# Patient Record
Sex: Female | Born: 1950 | Race: White | Hispanic: No | Smoking: Former smoker
Health system: Southern US, Community
[De-identification: ages and names within clinical notes are randomized; demographics above are authoritative.]

## PROBLEM LIST (undated history)

## (undated) DIAGNOSIS — I509 Heart failure, unspecified: Secondary | ICD-10-CM

## (undated) DIAGNOSIS — J449 Chronic obstructive pulmonary disease, unspecified: Secondary | ICD-10-CM

## (undated) DIAGNOSIS — M5126 Other intervertebral disc displacement, lumbar region: Secondary | ICD-10-CM

## (undated) DIAGNOSIS — Z720 Tobacco use: Secondary | ICD-10-CM

## (undated) DIAGNOSIS — F419 Anxiety disorder, unspecified: Secondary | ICD-10-CM

---

## 1998-06-29 ENCOUNTER — Ambulatory Visit (HOSPITAL_COMMUNITY): Admission: RE | Admit: 1998-06-29 | Discharge: 1998-06-29 | Payer: Self-pay | Admitting: Sports Medicine

## 1998-06-29 ENCOUNTER — Encounter: Payer: Self-pay | Admitting: Sports Medicine

## 1998-07-13 ENCOUNTER — Encounter: Payer: Self-pay | Admitting: Sports Medicine

## 1998-07-13 ENCOUNTER — Ambulatory Visit (HOSPITAL_COMMUNITY): Admission: RE | Admit: 1998-07-13 | Discharge: 1998-07-13 | Payer: Self-pay | Admitting: Sports Medicine

## 1998-07-27 ENCOUNTER — Encounter: Payer: Self-pay | Admitting: Sports Medicine

## 1998-07-27 ENCOUNTER — Ambulatory Visit (HOSPITAL_COMMUNITY): Admission: RE | Admit: 1998-07-27 | Discharge: 1998-07-27 | Payer: Self-pay | Admitting: Sports Medicine

## 1999-03-12 ENCOUNTER — Other Ambulatory Visit: Admission: RE | Admit: 1999-03-12 | Discharge: 1999-03-12 | Payer: Self-pay | Admitting: Obstetrics and Gynecology

## 2002-08-09 ENCOUNTER — Encounter: Payer: Self-pay | Admitting: Emergency Medicine

## 2002-08-09 ENCOUNTER — Emergency Department (HOSPITAL_COMMUNITY): Admission: EM | Admit: 2002-08-09 | Discharge: 2002-08-09 | Payer: Self-pay | Admitting: Emergency Medicine

## 2017-04-05 ENCOUNTER — Other Ambulatory Visit: Payer: Self-pay | Admitting: Family Medicine

## 2017-04-05 ENCOUNTER — Ambulatory Visit
Admission: RE | Admit: 2017-04-05 | Discharge: 2017-04-05 | Disposition: A | Discharge status: Home / Self Care | Payer: Medicare Other | Source: Ambulatory Visit | Attending: Family Medicine | Admitting: Family Medicine

## 2017-04-05 DIAGNOSIS — M545 Low back pain: Secondary | ICD-10-CM

## 2018-02-01 ENCOUNTER — Emergency Department (HOSPITAL_COMMUNITY): Payer: Medicare Other

## 2018-02-01 ENCOUNTER — Encounter (HOSPITAL_COMMUNITY): Payer: Self-pay | Admitting: Emergency Medicine

## 2018-02-01 ENCOUNTER — Other Ambulatory Visit: Payer: Self-pay

## 2018-02-01 ENCOUNTER — Inpatient Hospital Stay (HOSPITAL_COMMUNITY)
Admission: EM | Admit: 2018-02-01 | Discharge: 2018-02-06 | DRG: 286 | Disposition: A | Discharge status: Home / Self Care | Payer: Medicare Other | Attending: Internal Medicine | Admitting: Internal Medicine

## 2018-02-01 DIAGNOSIS — J9601 Acute respiratory failure with hypoxia: Secondary | ICD-10-CM | POA: Diagnosis present

## 2018-02-01 DIAGNOSIS — I5021 Acute systolic (congestive) heart failure: Principal | ICD-10-CM | POA: Diagnosis present

## 2018-02-01 DIAGNOSIS — Z880 Allergy status to penicillin: Secondary | ICD-10-CM

## 2018-02-01 DIAGNOSIS — J441 Chronic obstructive pulmonary disease with (acute) exacerbation: Secondary | ICD-10-CM

## 2018-02-01 DIAGNOSIS — E43 Unspecified severe protein-calorie malnutrition: Secondary | ICD-10-CM | POA: Diagnosis present

## 2018-02-01 DIAGNOSIS — J449 Chronic obstructive pulmonary disease, unspecified: Secondary | ICD-10-CM

## 2018-02-01 DIAGNOSIS — I214 Non-ST elevation (NSTEMI) myocardial infarction: Secondary | ICD-10-CM

## 2018-02-01 DIAGNOSIS — Z681 Body mass index (BMI) 19 or less, adult: Secondary | ICD-10-CM

## 2018-02-01 DIAGNOSIS — Z87891 Personal history of nicotine dependence: Secondary | ICD-10-CM

## 2018-02-01 DIAGNOSIS — I2510 Atherosclerotic heart disease of native coronary artery without angina pectoris: Secondary | ICD-10-CM | POA: Diagnosis present

## 2018-02-01 DIAGNOSIS — R0602 Shortness of breath: Secondary | ICD-10-CM | POA: Diagnosis not present

## 2018-02-01 DIAGNOSIS — E785 Hyperlipidemia, unspecified: Secondary | ICD-10-CM

## 2018-02-01 DIAGNOSIS — F17201 Nicotine dependence, unspecified, in remission: Secondary | ICD-10-CM

## 2018-02-01 DIAGNOSIS — F410 Panic disorder [episodic paroxysmal anxiety] without agoraphobia: Secondary | ICD-10-CM | POA: Diagnosis present

## 2018-02-01 DIAGNOSIS — R636 Underweight: Secondary | ICD-10-CM

## 2018-02-01 DIAGNOSIS — F419 Anxiety disorder, unspecified: Secondary | ICD-10-CM | POA: Diagnosis present

## 2018-02-01 DIAGNOSIS — I071 Rheumatic tricuspid insufficiency: Secondary | ICD-10-CM | POA: Diagnosis present

## 2018-02-01 DIAGNOSIS — R7989 Other specified abnormal findings of blood chemistry: Secondary | ICD-10-CM

## 2018-02-01 DIAGNOSIS — F411 Generalized anxiety disorder: Secondary | ICD-10-CM | POA: Diagnosis present

## 2018-02-01 DIAGNOSIS — I429 Cardiomyopathy, unspecified: Secondary | ICD-10-CM

## 2018-02-01 DIAGNOSIS — R000 Tachycardia, unspecified: Secondary | ICD-10-CM | POA: Diagnosis present

## 2018-02-01 DIAGNOSIS — K5900 Constipation, unspecified: Secondary | ICD-10-CM | POA: Diagnosis present

## 2018-02-01 DIAGNOSIS — R748 Abnormal levels of other serum enzymes: Secondary | ICD-10-CM | POA: Diagnosis not present

## 2018-02-01 DIAGNOSIS — R778 Other specified abnormalities of plasma proteins: Secondary | ICD-10-CM

## 2018-02-01 HISTORY — DX: Other intervertebral disc displacement, lumbar region: M51.26

## 2018-02-01 HISTORY — DX: Anxiety disorder, unspecified: F41.9

## 2018-02-01 HISTORY — DX: Chronic obstructive pulmonary disease, unspecified: J44.9

## 2018-02-01 HISTORY — DX: Tobacco use: Z72.0

## 2018-02-01 LAB — BASIC METABOLIC PANEL
Anion gap: 13 (ref 5–15)
BUN: 14 mg/dL (ref 8–23)
CO2: 23 mmol/L (ref 22–32)
CREATININE: 0.94 mg/dL (ref 0.44–1.00)
Calcium: 9.4 mg/dL (ref 8.9–10.3)
Chloride: 104 mmol/L (ref 98–111)
GFR calc Af Amer: >60 mL/min (ref >60)
GFR calc non Af Amer: >60 mL/min (ref >60)
GLUCOSE: 124 mg/dL — AB (ref 70–99)
Potassium: 4.5 mmol/L (ref 3.5–5.1)
Sodium: 140 mmol/L (ref 135–145)

## 2018-02-01 LAB — TSH: TSH: 0.87 u[IU]/mL (ref 0.350–4.500)

## 2018-02-01 LAB — CBC
HCT: 46.5 % — ABNORMAL HIGH (ref 36.0–46.0)
Hemoglobin: 15.2 g/dL — ABNORMAL HIGH (ref 12.0–15.0)
MCH: 31.1 pg (ref 26.0–34.0)
MCHC: 32.7 g/dL (ref 30.0–36.0)
MCV: 95.3 fL (ref 78.0–100.0)
PLATELETS: 243 10*3/uL (ref 150–400)
RBC: 4.88 MIL/uL (ref 3.87–5.11)
RDW: 13.2 % (ref 11.5–15.5)
WBC: 9.1 10*3/uL (ref 4.0–10.5)

## 2018-02-01 LAB — I-STAT TROPONIN, ED: Troponin i, poc: 1.55 ng/mL (ref 0.00–0.08)

## 2018-02-01 LAB — TROPONIN I: Troponin I: 1.05 ng/mL (ref <0.03)

## 2018-02-01 LAB — D-DIMER, QUANTITATIVE: D-Dimer, Quant: <0.27 ug/mL-FEU (ref 0.00–0.50)

## 2018-02-01 MED ORDER — ALBUTEROL SULFATE (2.5 MG/3ML) 0.083% IN NEBU: 2.5000 mg | INHALATION_SOLUTION | RESPIRATORY_TRACT | Status: DC | PRN

## 2018-02-01 MED ORDER — ASPIRIN 81 MG PO CHEW
324.0000 mg | CHEWABLE_TABLET | Freq: Once | ORAL | Status: AC
  Administered 2018-02-01: 324 mg via ORAL
  Filled 2018-02-01: qty 4

## 2018-02-01 MED ORDER — ENOXAPARIN SODIUM 40 MG/0.4ML ~~LOC~~ SOLN
40.0000 mg | Freq: Every day | SUBCUTANEOUS | Status: DC
  Filled 2018-02-01 (×4): qty 0.4

## 2018-02-01 MED ORDER — IPRATROPIUM-ALBUTEROL 0.5-2.5 (3) MG/3ML IN SOLN
3.0000 mL | Freq: Three times a day (TID) | RESPIRATORY_TRACT | Status: DC
  Administered 2018-02-02 – 2018-02-05 (×10): 3 mL via RESPIRATORY_TRACT
  Filled 2018-02-01 (×11): qty 3

## 2018-02-01 MED ORDER — ACETAMINOPHEN 650 MG RE SUPP: 650.0000 mg | Freq: Four times a day (QID) | RECTAL | Status: DC | PRN

## 2018-02-01 MED ORDER — PREDNISONE 50 MG PO TABS
60.0000 mg | ORAL_TABLET | Freq: Every day | ORAL | Status: AC
  Administered 2018-02-02 – 2018-02-03 (×2): 60 mg via ORAL
  Filled 2018-02-01 (×2): qty 1

## 2018-02-01 MED ORDER — ACETAMINOPHEN 325 MG PO TABS: 650.0000 mg | ORAL_TABLET | Freq: Four times a day (QID) | ORAL | Status: DC | PRN

## 2018-02-01 MED ORDER — IPRATROPIUM-ALBUTEROL 0.5-2.5 (3) MG/3ML IN SOLN
3.0000 mL | Freq: Four times a day (QID) | RESPIRATORY_TRACT | Status: DC
  Administered 2018-02-01: 3 mL via RESPIRATORY_TRACT
  Filled 2018-02-01: qty 3

## 2018-02-01 MED ORDER — HYDROCODONE-ACETAMINOPHEN 5-325 MG PO TABS
1.0000 | ORAL_TABLET | Freq: Once | ORAL | Status: AC
  Administered 2018-02-01: 1 via ORAL
  Filled 2018-02-01: qty 1

## 2018-02-01 NOTE — ED Triage Notes (Signed)
Pt arrived [facility] from home with c/o SOB x a few days gradually worsening, pt reports that starting last night she was having substantial increase in difficulty specifically with movement. Pt given 2.5 albuterol, 0.5 Atrovent, and 125mg  IV solumedrol PTA with EMS. Diagnosed with COPD "a couple months ago," Currently on 3L n/c with EMS  (not on home O2) Vitals with EMS BP 118/90 P114 O298% RR24. Pt denies any pain at this time. 18LAC

## 2018-02-01 NOTE — ED Notes (Signed)
ED Provider at bedside. 

## 2018-02-01 NOTE — ED Provider Notes (Signed)
[facility] Provider Note   CSN: [id number] Arrival date & time: 02/01/18  1524     History   Chief Complaint Chief Complaint  Patient presents with  . Shortness of Breath    HPI Vickie Bowman is a 67 y.o. female.  HPI Patient presents with shortness of breath.  Began a few days ago but states she is been feeling bad for couple weeks now.  Diagnosed by PCP with COPD couple months ago.  States that she has an inhaler but that is not working as well the last few days.  No chest pain.  Had a cough without real sputum production.  Sats in the low 90s for EMS.  She quit smoking around 2 weeks ago.  States she was smoked about a pack a day.  States her PCP diagnosed her with COPD but listening to her.  No swelling in her legs.  No abdominal pain.  No fevers.  Given breathing treatments and steroids by EMS.  Started on oxygen.  She is not on oxygen at baseline. Past Medical History:  Diagnosis Date  . COPD (chronic obstructive pulmonary disease) (HCC)     There are no active problems to display for this patient.   History reviewed. No pertinent surgical history.   OB History   None      Home Medications    Prior to Admission medications   Not on File    Family History No family history on file.  Social History Social History   Tobacco Use  . Smoking status: Former Smoker    Last attempt to quit: 01/07/2018    Years since quitting: 0.0  . Smokeless tobacco: Never Used  Substance Use Topics  . Alcohol use: Not Currently  . Drug use: Not Currently     Allergies   Penicillins   Review of Systems Review of Systems  Constitutional: Negative for appetite change.  HENT: Negative for congestion.   Respiratory: Positive for cough and shortness of breath. Negative for wheezing.   Cardiovascular: Negative for chest pain.  Gastrointestinal: Negative for abdominal pain.  Genitourinary: Negative for flank pain.    Musculoskeletal: Negative for back pain.  Neurological: Negative for seizures.  Hematological: Negative for adenopathy.  Psychiatric/Behavioral: Negative for confusion.     Physical Exam Updated Vital Signs BP 109/88   Pulse 100   Temp 98.2 F (36.8 C) (Temporal)   Resp (!) 24   Ht 5\' 8"  (1.727 m)   Wt 49.9 kg   SpO2 100%   BMI 16.73 kg/m   Physical Exam  Constitutional: She appears well-developed.  HENT:  Head: Atraumatic.  Eyes: Pupils are equal, round, and reactive to light.  Neck: Neck supple.  Cardiovascular:  Regular tachycardia  Pulmonary/Chest:  Mildly harsh breath sounds with somewhat decreased air movement without respiratory distress.  No focal rales or rhonchi.  Abdominal: Soft.  Musculoskeletal:       Right lower leg: She exhibits no edema.       Left lower leg: She exhibits no edema.  Neurological: She is alert.  Skin: Skin is warm. Capillary refill takes less than 2 seconds.     ED Treatments / Results  Labs (all labs ordered are listed, but only abnormal results are displayed) Labs Reviewed  BASIC METABOLIC PANEL - Abnormal; Notable for the following components:      Result Value   Glucose, Bld 124 (*)    All other components within normal limits  CBC - Abnormal; Notable for the following components:   Hemoglobin 15.2 (*)    HCT 46.5 (*)    All other components within normal limits  I-STAT TROPONIN, ED - Abnormal; Notable for the following components:   Troponin i, poc 1.55 (*)    All other components within normal limits  D-DIMER, QUANTITATIVE (NOT AT [facility])    EKG EKG Interpretation  Date/Time:  Thursday February 01 2018 15:31:00 EDT Ventricular Rate:  113 PR Interval:    QRS Duration: 91 QT Interval:  307 QTC Calculation: 421 R Axis:   88 Text Interpretation:  Sinus tachycardia Biatrial enlargement Anteroseptal infarct, age indeterminate Lead(s) I were not used for morphology analysis Confirmed by Benjiman Core [id number]) on  02/01/2018 3:41:34 PM Also confirmed by Benjiman Core [phone]), editor Sheppard Evens ([id number])  on 02/01/2018 4:30:38 PM   Radiology Dg Chest 2 View  Result Date: 02/01/2018 CLINICAL DATA:  Short of breath for several days, worse recently, post breathing treatment EXAM: CHEST - 2 VIEW COMPARISON:  None. FINDINGS: The lungs are markedly hyperaerated with increased AP diameter and flattened hemidiaphragms consistent with emphysema. No parenchymal infiltrate or pleural effusion is seen and no lung nodule is evident. Mediastinal and hilar contours are unremarkable. The heart is within normal limits in size. No bony abnormality is seen. IMPRESSION: Hyperaeration most consistent with emphysema. No active lung disease. Electronically Signed   By: Dwyane Dee M.D.   On: 02/01/2018 17:12    Procedures Procedures (including critical care time)  Medications Ordered in ED Medications - No data to display   Initial Impression / Assessment and Plan / ED Course  I have reviewed the triage vital signs and the nursing notes.  Pertinent labs & imaging results that were available during my care of the patient were reviewed by me and considered in my medical decision making (see chart for details).     Patient with shortness of breath.  Worse recently.  Has been diagnosed with COPD.  Has had no chest pain.  Feels better on oxygen but sats did not go hypoxic.  Negative d-dimer.  EKG overall has nonspecific changes but troponin is elevated.  Will admit to hospital for further evaluation and treatment.  Final Clinical Impressions(s) / ED Diagnoses   Final diagnoses:  COPD exacerbation (HCC)  Elevated troponin    ED Discharge Orders    None       Benjiman Core, MD 02/01/18 1718

## 2018-02-01 NOTE — ED Notes (Signed)
Patient transported to X-ray 

## 2018-02-01 NOTE — H&P (Signed)
History and Physical    Vickie GillesSylvia J Khim BJY:[id number] DOB: 06-22-1950 DOA: 02/01/2018  PCP: Aida PufferLittle, James, MD  Patient coming from:   I have personally briefly [facility]  Chief Complaint: sob since many months, worsened last night.   HPI: Vickie Bowman is a 67 y.o. female with medical history significant of recently diagnosed with COPD by PCP, comes in for worsening sob since 3 months. Last night, pt reports having an acute dyspnea at rest, worsens with ambulation, and relieved some on using her inhaler. But she continues to have persistent symptoms of sob, she called EMS. EMS apparently found  Her to be diffusely wheezing, put her on oxygen and gave her a neb treatment. She was brought ot ED for further evaluation. On arrival her wheezing has improved and dyspnea improved. She denies any chest pain, tightness, cough, syncope, pedal edema, orthopnea, PND. She reports she had sweating yesterday and this morning associated with sob.  She denies fever or chills. She denies nausea, vomiting , abdominal pain, diarrhea, dysuria, . She denies any tingling or numbness or any weakness of her legs or arms.   ED Course: workk up in ED, showed hemoglobin or 15.2 normal creatinine, d dimer is 0.27 troponin of 1.55. CXR shows emphysema. EKG shows sinus tachycardia with bi atrial enlargement.  She was referred to medical service for admission for elevated troponin and sob.   Review of Systems: As per HPI otherwise 10 point review of systems negative.   Past Medical History:  Diagnosis Date  . COPD (chronic obstructive pulmonary disease) (HCC)     History reviewed. No pertinent surgical history.   reports that she quit smoking about 3 weeks ago. She has never used smokeless tobacco. She reports that she drank alcohol. She reports that she has current or past drug history.  Allergies  Allergen Reactions  . Penicillins Swelling    Family history of  breast cancer in the sister,  H/o cancer in both parents.   Prior to Admission medications   Not on File    Physical Exam: Vitals:   02/01/18 1600 02/01/18 1615 02/01/18 1630 02/01/18 1730  BP: (!) 116/91 115/90 109/88 102/66  Pulse: (!) 101 100 100   Resp: (!) 25 19 (!) 24 19  Temp:      TempSrc:      SpO2: 100% 96% 100%   Weight:      Height:        Constitutional: NAD, calm, comfortable Vitals:   02/01/18 1600 02/01/18 1615 02/01/18 1630 02/01/18 1730  BP: (!) 116/91 115/90 109/88 102/66  Pulse: (!) 101 100 100   Resp: (!) 25 19 (!) 24 19  Temp:      TempSrc:      SpO2: 100% 96% 100%   Weight:      Height:       Eyes: PERRL, lids and conjunctivae normal ENMT: Mucous membranes are dry  Neck: normal, supple, no masses, no thyromegaly Respiratory: diminished air entry bilateral, scattered wheezing , no rhonchi.  Cardiovascular: Regular rate and rhythm, no murmurs / rubs / gallops. No extremity edema. 2+ pedal pulses. Abdomen: no tenderness, no masses palpated. No hepatosplenomegaly. Bowel sounds positive.  Musculoskeletal: no clubbing / cyanosis. No joint deformity upper and lower extremities. Good ROM, no contractures. Neurologic: CN 2-12 grossly intact. Sensation intact, DTR normal. Strength 5/5 in all 4.  Psychiatric: Normal judgment and insight. Alert and oriented x 3.anxious  Labs on Admission: I have personally reviewed following labs and imaging studies  CBC: Recent Labs  Lab 02/01/18 1538  WBC 9.1  HGB 15.2*  HCT 46.5*  MCV 95.3  PLT 243   Basic Metabolic Panel: Recent Labs  Lab 02/01/18 1538  NA 140  K 4.5  CL 104  CO2 23  GLUCOSE 124*  BUN 14  CREATININE 0.94  CALCIUM 9.4   GFR: Estimated Creatinine Clearance: 44.5 mL/min (by C-G formula based on SCr of 0.94 mg/dL). Liver Function Tests: No results for input(s): AST, ALT, ALKPHOS, BILITOT, PROT, ALBUMIN in the last 168 hours. No results for input(s): LIPASE, AMYLASE in the last 168  hours. No results for input(s): AMMONIA in the last 168 hours. Coagulation Profile: No results for input(s): INR, PROTIME in the last 168 hours. Cardiac Enzymes: No results for input(s): CKTOTAL, CKMB, CKMBINDEX, TROPONINI in the last 168 hours. BNP (last 3 results) No results for input(s): PROBNP in the last 8760 hours. HbA1C: No results for input(s): HGBA1C in the last 72 hours. CBG: No results for input(s): GLUCAP in the last 168 hours. Lipid Profile: No results for input(s): CHOL, HDL, LDLCALC, TRIG, CHOLHDL, LDLDIRECT in the last 72 hours. Thyroid Function Tests: No results for input(s): TSH, T4TOTAL, FREET4, T3FREE, THYROIDAB in the last 72 hours. Anemia Panel: No results for input(s): VITAMINB12, FOLATE, FERRITIN, TIBC, IRON, RETICCTPCT in the last 72 hours. Urine analysis: No results found for: COLORURINE, APPEARANCEUR, LABSPEC, PHURINE, GLUCOSEU, HGBUR, BILIRUBINUR, KETONESUR, PROTEINUR, UROBILINOGEN, NITRITE, LEUKOCYTESUR  Radiological Exams on Admission: Dg Chest 2 View  Result Date: 02/01/2018 CLINICAL DATA:  Short of breath for several days, worse recently, post breathing treatment EXAM: CHEST - 2 VIEW COMPARISON:  None. FINDINGS: The lungs are markedly hyperaerated with increased AP diameter and flattened hemidiaphragms consistent with emphysema. No parenchymal infiltrate or pleural effusion is seen and no lung nodule is evident. Mediastinal and hilar contours are unremarkable. The heart is within normal limits in size. No bony abnormality is seen. IMPRESSION: Hyperaeration most consistent with emphysema. No active lung disease. Electronically Signed   By: Dwyane Dee M.D.   On: 02/01/2018 17:12    EKG: Independently reviewed. Sinus tachycardia, bi atrial enlargement  Assessment/Plan Active Problems:   SOB (shortness of breath)    Dyspnea at rest and worse on exertion: Admit for acute copd exacerbation. CXR shows emphysema.  Start her oral prednisone 60 mg daily and  duonebs.   oxygen to keep sats greater than 90%. She will need pulmonology referral on discharge for PFT'S.   Elevated troponin; Pt denies chest pain, chest tightness. D dimer is negative.  Ekg does not show any ischemic changes.  Admit for observation for serial troponins, EKG in am.  Echocardiogram ordered.   Tobacco abuse: Pt reports she quit 3 weeks ago. Counseled .   DVT prophylaxis: lovenox.  Code Status: full code.  Family Communication: friend at bedside.  Disposition Plan: pending clinical improvement.  Consults called: none.  Admission status: tele/obs   Kathlen Mody MD [facility] Pager [phone]  If 7PM-7AM, please contact night-coverage [email or web address] Password [facility]  02/01/2018, 6:23 PM

## 2018-02-02 ENCOUNTER — Ambulatory Visit (HOSPITAL_BASED_OUTPATIENT_CLINIC_OR_DEPARTMENT_OTHER): Payer: Medicare Other

## 2018-02-02 ENCOUNTER — Encounter (HOSPITAL_COMMUNITY): Payer: Self-pay | Admitting: Physician Assistant

## 2018-02-02 DIAGNOSIS — J441 Chronic obstructive pulmonary disease with (acute) exacerbation: Secondary | ICD-10-CM

## 2018-02-02 DIAGNOSIS — I214 Non-ST elevation (NSTEMI) myocardial infarction: Secondary | ICD-10-CM

## 2018-02-02 DIAGNOSIS — Z720 Tobacco use: Secondary | ICD-10-CM

## 2018-02-02 DIAGNOSIS — R0602 Shortness of breath: Secondary | ICD-10-CM

## 2018-02-02 DIAGNOSIS — I5043 Acute on chronic combined systolic (congestive) and diastolic (congestive) heart failure: Secondary | ICD-10-CM

## 2018-02-02 DIAGNOSIS — E43 Unspecified severe protein-calorie malnutrition: Secondary | ICD-10-CM | POA: Diagnosis present

## 2018-02-02 LAB — BASIC METABOLIC PANEL
ANION GAP: 9 (ref 5–15)
BUN: 19 mg/dL (ref 8–23)
CALCIUM: 9.4 mg/dL (ref 8.9–10.3)
CHLORIDE: 108 mmol/L (ref 98–111)
CO2: 25 mmol/L (ref 22–32)
Creatinine, Ser: 0.85 mg/dL (ref 0.44–1.00)
GFR calc non Af Amer: >60 mL/min (ref >60)
Glucose, Bld: 120 mg/dL — ABNORMAL HIGH (ref 70–99)
POTASSIUM: 4.6 mmol/L (ref 3.5–5.1)
Sodium: 142 mmol/L (ref 135–145)

## 2018-02-02 LAB — TROPONIN I
TROPONIN I: 0.97 ng/mL — AB (ref <0.03)
Troponin I: 0.81 ng/mL (ref <0.03)

## 2018-02-02 LAB — CBC
HEMATOCRIT: 44.4 % (ref 36.0–46.0)
HEMOGLOBIN: 14.5 g/dL (ref 12.0–15.0)
MCH: 30.9 pg (ref 26.0–34.0)
MCHC: 32.7 g/dL (ref 30.0–36.0)
MCV: 94.5 fL (ref 78.0–100.0)
Platelets: 223 10*3/uL (ref 150–400)
RBC: 4.7 MIL/uL (ref 3.87–5.11)
RDW: 13.3 % (ref 11.5–15.5)
WBC: 8.5 10*3/uL (ref 4.0–10.5)

## 2018-02-02 LAB — ECHOCARDIOGRAM COMPLETE
HEIGHTINCHES: 67 in
WEIGHTICAEL: 1717.82 [oz_av]

## 2018-02-02 LAB — MRSA PCR SCREENING: MRSA by PCR: NEGATIVE

## 2018-02-02 LAB — LIPID PANEL
CHOL/HDL RATIO: 3 ratio
Cholesterol: 207 mg/dL — ABNORMAL HIGH (ref 0–200)
HDL: 69 mg/dL (ref >40)
LDL CALC: 129 mg/dL — AB (ref 0–99)
TRIGLYCERIDES: 43 mg/dL (ref <150)
VLDL: 9 mg/dL (ref 0–40)

## 2018-02-02 LAB — HIV ANTIBODY (ROUTINE TESTING W REFLEX): HIV SCREEN 4TH GENERATION: NONREACTIVE

## 2018-02-02 MED ORDER — ENSURE ENLIVE PO LIQD
237.0000 mL | Freq: Two times a day (BID) | ORAL | Status: DC
  Administered 2018-02-03 – 2018-02-06 (×4): 237 mL via ORAL

## 2018-02-02 MED ORDER — LEVALBUTEROL HCL 0.63 MG/3ML IN NEBU
INHALATION_SOLUTION | RESPIRATORY_TRACT | Status: AC
  Filled 2018-02-02: qty 3

## 2018-02-02 MED ORDER — FUROSEMIDE 10 MG/ML IJ SOLN
20.0000 mg | Freq: Two times a day (BID) | INTRAMUSCULAR | Status: DC
  Administered 2018-02-02 – 2018-02-03 (×2): 20 mg via INTRAVENOUS
  Filled 2018-02-02 (×2): qty 2

## 2018-02-02 MED ORDER — LEVALBUTEROL HCL 0.63 MG/3ML IN NEBU
0.6300 mg | INHALATION_SOLUTION | RESPIRATORY_TRACT | Status: DC | PRN
  Administered 2018-02-02 – 2018-02-05 (×5): 0.63 mg via RESPIRATORY_TRACT
  Filled 2018-02-02 (×4): qty 3

## 2018-02-02 MED ORDER — ALPRAZOLAM 0.25 MG PO TABS
0.2500 mg | ORAL_TABLET | Freq: Two times a day (BID) | ORAL | Status: DC | PRN
  Administered 2018-02-02 – 2018-02-06 (×7): 0.25 mg via ORAL
  Filled 2018-02-02 (×7): qty 1

## 2018-02-02 MED ORDER — ATORVASTATIN CALCIUM 40 MG PO TABS
40.0000 mg | ORAL_TABLET | Freq: Every day | ORAL | Status: DC
  Administered 2018-02-02 – 2018-02-03 (×2): 40 mg via ORAL
  Filled 2018-02-02 (×3): qty 1

## 2018-02-02 MED ORDER — ASPIRIN EC 81 MG PO TBEC
81.0000 mg | DELAYED_RELEASE_TABLET | Freq: Every day | ORAL | Status: DC
  Administered 2018-02-02 – 2018-02-06 (×4): 81 mg via ORAL
  Filled 2018-02-02 (×5): qty 1

## 2018-02-02 MED ORDER — ADULT MULTIVITAMIN W/MINERALS CH
1.0000 | ORAL_TABLET | Freq: Every day | ORAL | Status: DC
  Administered 2018-02-02 – 2018-02-06 (×4): 1 via ORAL
  Filled 2018-02-02 (×5): qty 1

## 2018-02-02 NOTE — Progress Notes (Signed)
Cardiac cath preliminarily arranged for Monday AM 10:30 with Dr. Okey DupreEnd. Orders written, which include tentative limited IV fluids of 50cc/hr starting at 0700. These are under sign/held in case adjustments to regimen are needed based on patient's clinical status. Cardiology will be following over the weekend. Dr. Rennis GoldenHilty did discuss procedure with patient. I called back into her room to go over risks/benefits. Risks and benefits of cardiac catheterization have been discussed with the patient. These include bleeding, infection, kidney damage, stroke, heart attack, death. The potential outcomes were discussed.  The patient understands these risks and is willing to proceed. Ario Mcdiarmid PA-C

## 2018-02-02 NOTE — Discharge Instructions (Signed)

## 2018-02-02 NOTE — Care Management Obs Status (Signed)
MEDICARE OBSERVATION STATUS NOTIFICATION   Patient Details  Name: Vickie GillesSylvia J Righetti MRN: [id number] Date of Birth: 1950-06-29   Medicare Observation Status Notification Given:  Yes    [name], Jannell Franta S, RN 02/02/2018, 5:24 PM

## 2018-02-02 NOTE — Progress Notes (Signed)
PROGRESS NOTE  Vickie GillesSylvia J Bowman  [id number] DOB: 28-Mar-1951 DOA: 02/01/2018 PCP: Aida PufferLittle, James, MD   Brief Narrative: Vickie GillesSylvia J Bowman is a 67 y.o. female with a long history of tobacco use, having quit 3 weeks ago, and recent clinical Dx of COPD who presented to the ED with progressive shortness of breath. Initially worse on exertion, dyspnea has become more constant and worsens despite inhaler use at home. She was found to be wheezing, improved with nebulizer therapies but had ongoing elevated work of breathing, improved with supplemental oxygen. Hemoglobin was 15.2, d-dimer undetectable. CXR with emphysematous changes without infiltrate. Troponin was elevated at 1.55 with sinus tachycardia on ECG. She was admitted, placed on steroids, scheduled and prn nebulizer treatments, and continued on supplemental oxygen.   Assessment & Plan: Active Problems:   SOB (shortness of breath)  COPD exacerbation with acute respiratory distress: No documented hypoxia though the patient's work of breathing is notably elevated and she is dyspneic on exertion.  - No wheezing on exam this morning, continue steroids and scheduled nebs. To minimize tachycardia about which the patient is anxious, will substitute xopenex on prn Tx's.  - Will need nebs at home. CM consulted - Will need pulmonology follow up with formal PFTs outside scope of acute exacerbation.   Troponin elevation: With downward trend, no ischemic ECG changes, and no chest pain this is more suggestive of demand ischemia.  - Check echocardiogram. If significant abnormality found, would consult cardiology. Otherwise, would benefit from non-urgent ischemic evaluation.  - ASA  Tobacco use:  - Encouraged continued cessation.    Underweight:  - Dietitian consulted  DVT prophylaxis: Lovenox Code Status: Full Family Communication: None at bedside Disposition Plan: Home once respiratory effort/status is improved.  Consultants:   None  Procedures:    Echocardiogram  Antimicrobials:  None   Subjective: Breathing is better than at admission, but still anxious that she cannot walk very far without feeling very short of breath. She can usually walk indefinitely without problems breathing up until a couple months ago. No chest pain. Feels she gets very anxious when she's having trouble breathing.  Objective: Vitals:   02/02/18 0740 02/02/18 0759 02/02/18 1159 02/02/18 1215  BP: (!) 125/92  (!) 111/98   Pulse: (!) 101  100 99  Resp: (!) 35  (!) 34 (!) 28  Temp: 97.6 F (36.4 C)  97.6 F (36.4 C)   TempSrc: Oral  Oral   SpO2: 94% 96% (!) 84% (!) 89%  Weight:      Height:        Intake/Output Summary (Last 24 hours) at 02/02/2018 1308 Last data filed at 02/01/2018 2308 Gross per 24 hour  Intake 240 ml  Output -  Net 240 ml   Filed Weights   02/01/18 1532 02/01/18 2256  Weight: 49.9 kg 48.7 kg   Gen: Thin female in no distress Pulm: Tachypneic with elevated WOB, diminished but clear bilaterally. CV: Regular borderline tachycardia. No murmur, rub, or gallop. No JVD, no pedal edema. GI: Abdomen soft, non-tender, non-distended, with normoactive bowel sounds. No organomegaly or masses felt. Ext: Warm, no deformities Skin: No rashes, lesions or ulcers Neuro: Alert and oriented. No focal neurological deficits. Psych: Judgement and insight appear normal. Mood anxious & affect broad.   Data Reviewed: I have personally reviewed following labs and imaging studies  CBC: Recent Labs  Lab 02/01/18 1538 02/02/18 0624  WBC 9.1 8.5  HGB 15.2* 14.5  HCT 46.5* 44.4  MCV 95.3 94.5  PLT 243 223   Basic Metabolic Panel: Recent Labs  Lab 02/01/18 1538 02/02/18 0624  NA 140 142  K 4.5 4.6  CL 104 108  CO2 23 25  GLUCOSE 124* 120*  BUN 14 19  CREATININE 0.94 0.85  CALCIUM 9.4 9.4   GFR: Estimated Creatinine Clearance: 48 mL/min (by C-G formula based on SCr of 0.85 mg/dL). Liver Function Tests: No results for input(s):  AST, ALT, ALKPHOS, BILITOT, PROT, ALBUMIN in the last 168 hours. No results for input(s): LIPASE, AMYLASE in the last 168 hours. No results for input(s): AMMONIA in the last 168 hours. Coagulation Profile: No results for input(s): INR, PROTIME in the last 168 hours. Cardiac Enzymes: Recent Labs  Lab 02/01/18 1937 02/02/18 0030 02/02/18 0624  TROPONINI 1.05* 0.81* 0.97*   BNP (last 3 results) No results for input(s): PROBNP in the last 8760 hours. HbA1C: No results for input(s): HGBA1C in the last 72 hours. CBG: No results for input(s): GLUCAP in the last 168 hours. Lipid Profile: Recent Labs    02/02/18 0624  CHOL 207*  HDL 69  LDLCALC 129*  TRIG 43  CHOLHDL 3.0   Thyroid Function Tests: Recent Labs    02/01/18 1937  TSH 0.870   Anemia Panel: No results for input(s): VITAMINB12, FOLATE, FERRITIN, TIBC, IRON, RETICCTPCT in the last 72 hours. Urine analysis: No results found for: COLORURINE, APPEARANCEUR, LABSPEC, PHURINE, GLUCOSEU, HGBUR, BILIRUBINUR, KETONESUR, PROTEINUR, UROBILINOGEN, NITRITE, LEUKOCYTESUR Recent Results (from the past 240 hour(s))  MRSA PCR Screening     Status: None   Collection Time: 02/01/18 11:51 PM  Result Value Ref Range Status   MRSA by PCR NEGATIVE NEGATIVE Final    Comment:        The GeneXpert MRSA Assay (FDA approved for NASAL specimens only), is one component of a comprehensive MRSA colonization surveillance program. It is not intended to diagnose MRSA infection nor to guide or monitor treatment for MRSA infections. Performed at [facility], [address], [place], [place] [address]       Radiology Studies: Dg Chest 2 View  Result Date: 02/01/2018 CLINICAL DATA:  Short of breath for several days, worse recently, post breathing treatment EXAM: CHEST - 2 VIEW COMPARISON:  None. FINDINGS: The lungs are markedly hyperaerated with increased AP diameter and flattened hemidiaphragms consistent with emphysema. No parenchymal  infiltrate or pleural effusion is seen and no lung nodule is evident. Mediastinal and hilar contours are unremarkable. The heart is within normal limits in size. No bony abnormality is seen. IMPRESSION: Hyperaeration most consistent with emphysema. No active lung disease. Electronically Signed   By: Dwyane Dee M.D.   On: 02/01/2018 17:12    Scheduled Meds: . enoxaparin (LOVENOX) injection  40 mg Subcutaneous Daily  . ipratropium-albuterol  3 mL Nebulization TID  . predniSONE  60 mg Oral QAC breakfast   Continuous Infusions:   LOS: 0 days   Time spent: 25 minutes.  Tyrone Nine, MD [facility] [email or web address] Password [email or web address] 02/02/2018, 1:08 PM

## 2018-02-02 NOTE — Care Management Note (Signed)
Case Management Note  Patient Details  Name: Vickie Bowman MRN: [id number] Date of Birth: Oct 23, 1950  Subjective/Objective:                    Action/Plan:  PTA independent from home.   Expected Discharge Date:  02/03/18               Expected Discharge Plan:  Home w Home Health Services(Independent from home)  In-House Referral:     Discharge planning Services  CM Consult  Post Acute Care Choice:    Choice offered to:  Patient  DME Arranged:  Nebulizer/meds DME Agency:  [facility]  [facility]     Status of Service:  In process, will continue to follow  If discussed at Long Length of Stay Meetings, dates discussed:    Additional Comments:  [name], Joci Dress S, RN 02/02/2018, 5:32 PM

## 2018-02-02 NOTE — Progress Notes (Signed)
Initial Nutrition Assessment  DOCUMENTATION CODES:   Severe malnutrition in context of chronic illness, Underweight  INTERVENTION:   - Ensure Enlive po BID, each supplement provides 350 kcal and 20 grams of protein (chocolate)  - MVI with minerals daily  NUTRITION DIAGNOSIS:   Severe Malnutrition related to chronic illness (COPD) as evidenced by moderate fat depletion, severe fat depletion, moderate muscle depletion, severe muscle depletion.  GOAL:   Patient will meet greater than or equal to 90% of their needs  MONITOR:   PO intake, Supplement acceptance, Labs, Weight trends  REASON FOR ASSESSMENT:   Consult Assessment of nutrition requirement/status  ASSESSMENT:   67 year old female who presented to the ED on 9/12 with SOB. PMH significant for recent diagnosis of COPD.  Spoke with pt at bedside who reports that her bottom dentures do not fit well and feel like they are going to fall out. Pt states, "I took my teeth out because I can't eat with them." RD offered to modify the texture of pt's diet order but pt declined, stating "I can chew anything without teeth, even steak."  Pt states she has a big appetite but doesn't "eat a whole lot." Pt states she feels satisfied when she finished eating but frequently told RD, "I don;t want to get fat" and "I don't want to gain weight." Suspect pt may have some underling disordered thoughts about food and body. Unsure how much this is contributing to her underweight and severely malnourished status. Provided education regarding increased nutrient needs in COPD. Pt expressed understanding and stated she was willing to try to "eat a little more."  Pt reports she eats 2-3 meals daily and drinks regular Coke and some water throughout the day. Pt endorses snacking on chips and candy.  Breakfast: skips OR omelet and coffee OR cereal and coffee, per pt: "I eat whatever I want" Lunch: skips OR ham sandwich Dinner: cubed steak, creamed  potatoes, gravy  Pt denies recent weight loss and states that her UBW is 110-120 lbs. Pt states that this has been her weight "for a long time." No weight history recorded in pt's chart. Noted weight on admission was 107.4 lbs.  Pt is willing to try Ensure Enlive oral nutrition supplement during admission. Encouraged pt to consume oral nutrition supplements after discharge. RD to order MVI.  Medications reviewed.  Labs reviewed: cholesterol 207 (H), LDL 129 (H)  NUTRITION - FOCUSED PHYSICAL EXAM:    Most Recent Value  Orbital Region  Moderate depletion  Upper Arm Region  Severe depletion  Thoracic and Lumbar Region  Severe depletion  Buccal Region  Moderate depletion  Temple Region  Moderate depletion  Clavicle Bone Region  Moderate depletion  Clavicle and Acromion Bone Region  Severe depletion  Scapular Bone Region  Unable to assess  Dorsal Hand  Moderate depletion  Patellar Region  Severe depletion  Anterior Thigh Region  Severe depletion  Posterior Calf Region  Severe depletion  Edema (RD Assessment)  None  Hair  Reviewed  Eyes  Reviewed  Mouth  Reviewed  Skin  Reviewed  Nails  Reviewed       Diet Order:   Diet Order            Diet Heart Room service appropriate? Yes; Fluid consistency: Thin  Diet effective now              EDUCATION NEEDS:   Education needs have been addressed  Skin:  Skin Assessment: Reviewed RN Assessment  Last BM:  02/01/18  Height:   Ht Readings from Last 1 Encounters:  02/01/18 5\' 7"  (1.702 m)    Weight:   Wt Readings from Last 1 Encounters:  02/01/18 48.7 kg    Ideal Body Weight:  61.36 kg  BMI:  Body mass index is 16.82 kg/m.  Estimated Nutritional Needs:   Kcal:  1500-1700  Protein:  70-85 grams  Fluid:  1.5-1.7 L    Earma Reading, MS, RD, LDN Pager: [phone] Weekend/After Hours: [phone]

## 2018-02-02 NOTE — Consult Note (Addendum)
Cardiology Consultation:   Patient ID: SARELY STRACENER; [id number]; 1950-10-05   Admit date: 02/01/2018 Date of Consult: 02/02/2018  Primary Care Provider: Aida Puffer, MD Primary Cardiologist: New to Dr. Rennis Golden  Chief Complaint: extreme shortness of breath  Patient Profile:   LAVREN LEWAN is a 68 y.o. female with a hx of 50 years tobacco abuse (recently quit), COPD, anxiety and herniated disc who is being seen today for the evaluation of elevated troponin (1.55) at the request of Dr. Jarvis Newcomer.  History of Present Illness:   CLYDEAN POSAS has no prior personal or family history of cardiac disease. She's considered herself quite healthy over the years with minimal PMH as above. A few months ago she went to PCP for increased DOE and was prescribed Spiriva and ProAir for presumed COPD. These initially helped but the dyspnea has worsened since that time. 2 weeks ago this prompted her to quit smoking. The night of 9/11 her breathing became considerably worse where she was markedly SOB even at rest. This seemed to be waking her up from sleep as well. She initially had a productive-type cough but this has been more dry lately. No hemoptysis. Denies weight loss but she is quite thin - reports she has a good appetite but "I just don't eat a lot." She called EMS yesterday afternoon who administered 2.5 albuterol, 0.5 Atrovent, and 125mg  IV solumedrol. She has since been admitted for COPD exacerbation. Workup reveals sinus tach, troponin POC 1.55-> 1.05->0.81->0.97, LDL 129, CBC OK, Cr 0.85, TSH wnl, d-dimer wnl. CXR with hyperaeration most c/w emphysema, no acute lung disease. She is being treated with nebs and steroids. She reports moderate improvement in breathing but not yet back to baseline where she was a few months ago. She adamantly denies any CP. No palpitations, dizziness, edema, bleeding, fevers, chills or anything else unusual.   Past Medical History:  Diagnosis Date  . Anxiety   . COPD  (chronic obstructive pulmonary disease) (HCC)   . Lumbar herniated disc   . Tobacco abuse     History reviewed. No pertinent surgical history.   Inpatient Medications: Scheduled Meds: . enoxaparin (LOVENOX) injection  40 mg Subcutaneous Daily  . ipratropium-albuterol  3 mL Nebulization TID  . predniSONE  60 mg Oral QAC breakfast   Continuous Infusions:  PRN Meds: acetaminophen **OR** acetaminophen, ALPRAZolam, levalbuterol  Home Meds: Prior to Admission medications   Medication Sig Start Date End Date Taking? Authorizing Provider  HYDROcodone-acetaminophen (NORCO/VICODIN) 5-325 MG tablet Take 1 tablet by mouth 2 (two) times daily as needed for pain. 11/20/17  Yes [provider]  meloxicam (MOBIC) 15 MG tablet Take 15 mg by mouth daily as needed. Back pain 12/26/17  Yes [provider]  PROAIR HFA 108 (90 Base) MCG/ACT inhaler Inhale 2 puffs into the lungs 4 (four) times daily as needed for wheezing. 01/20/18  Yes [provider]  SPIRIVA RESPIMAT 1.25 MCG/ACT AERS Inhale 1 puff into the lungs daily. 01/11/18  Yes [provider]    Allergies:    Allergies  Allergen Reactions  . Penicillins Swelling    Has patient had a PCN reaction causing immediate rash, facial/tongue/throat swelling, SOB or lightheadedness with hypotension: Yes Has patient had a PCN reaction causing severe rash involving mucus membranes or skin necrosis: No Has patient had a PCN reaction that required hospitalization: No Has patient had a PCN reaction occurring within the last 10 years: No If all of the above answers are "NO", then  may proceed with Cephalosporin use.     Social History:   Social History   Socioeconomic History  . Marital status: Widowed    Spouse name: Not on file  . Number of children: Not on file  . Years of education: Not on file  . Highest education level: Not on file  Occupational History  . Not on file  Social Needs  . Financial resource  strain: Not on file  . Food insecurity:    Worry: Not on file    Inability: Not on file  . Transportation needs:    Medical: Not on file    Non-medical: Not on file  Tobacco Use  . Smoking status: Former Smoker    Last attempt to quit: 01/07/2018    Years since quitting: 0.0  . Smokeless tobacco: Never Used  . Tobacco comment: Smoked since age 18, quit 12/2017  Substance and Sexual Activity  . Alcohol use: Yes    Comment: Rare, glass of wine no more than every few weeks  . Drug use: Never  . Sexual activity: Not Currently  Lifestyle  . Physical activity:    Days per week: Not on file    Minutes per session: Not on file  . Stress: Not on file  Relationships  . Social connections:    Talks on phone: Not on file    Gets together: Not on file    Attends religious service: Not on file    Active member of club or organization: Not on file    Attends meetings of clubs or organizations: Not on file    Relationship status: Not on file  . Intimate partner violence:    Fear of current or ex partner: Not on file    Emotionally abused: Not on file    Physically abused: Not on file    Forced sexual activity: Not on file  Other Topics Concern  . Not on file  Social History Narrative  . Not on file    Family History:   The patient's family history includes Breast cancer in her sister; Liver cancer in her mother. There is no history of CAD.  ROS:  Please see the history of present illness.  All other ROS reviewed and negative.     Physical Exam/Data:   Vitals:   02/02/18 0740 02/02/18 0759 02/02/18 1159 02/02/18 1215  BP: (!) 125/92  (!) 111/98   Pulse: (!) 101  100 99  Resp: (!) 35  (!) 34 (!) 28  Temp: 97.6 F (36.4 C)  97.6 F (36.4 C)   TempSrc: Oral  Oral   SpO2: 94% 96% (!) 84% (!) 89%  Weight:      Height:        Intake/Output Summary (Last 24 hours) at 02/02/2018 1340 Last data filed at 02/01/2018 2308 Gross per 24 hour  Intake 240 ml  Output -  Net 240 ml    Filed Weights   02/01/18 1532 02/01/18 2256  Weight: 49.9 kg 48.7 kg   Body mass index is 16.82 kg/m.  General: Well developed, well nourished, in no acute distress. Head: Normocephalic, atraumatic, sclera non-icteric, no xanthomas, nares are without discharge.  Neck: Negative for carotid bruits. JVD not elevated. Lungs: Clear bilaterally to auscultation without wheezes, rales, or rhonchi. Breathing is unlabored. Heart: RRR with S1 S2. No murmurs, rubs, or gallops appreciated. Abdomen: Soft, non-tender, non-distended with normoactive bowel sounds. No hepatomegaly. No rebound/guarding. No obvious abdominal masses. Msk:  Strength and tone appear  normal for age. Extremities: No clubbing or cyanosis. No edema.  Distal pedal pulses are 2+ and equal bilaterally. Neuro: Alert and oriented X 3. No facial asymmetry. No focal deficit. Moves all extremities spontaneously. Psych:  Responds to questions appropriately with a normal a*ffect.  EKG:  The EKG was personally reviewed and demonstrates sinus tahcycardia 113bpm, biatrial enlargement, possible prior anteroseptal infarct  Relevant CV Studies: Pending echo  Laboratory Data:  Chemistry Recent Labs  Lab 02/01/18 1538 02/02/18 0624  NA 140 142  K 4.5 4.6  CL 104 108  CO2 23 25  GLUCOSE 124* 120*  BUN 14 19  CREATININE 0.94 0.85  CALCIUM 9.4 9.4  GFRNONAA >60 >60  GFRAA >60 >60  ANIONGAP 13 9    No results for input(s): PROT, ALBUMIN, AST, ALT, ALKPHOS, BILITOT in the last 168 hours. Hematology Recent Labs  Lab 02/01/18 1538 02/02/18 0624  WBC 9.1 8.5  RBC 4.88 4.70  HGB 15.2* 14.5  HCT 46.5* 44.4  MCV 95.3 94.5  MCH 31.1 30.9  MCHC 32.7 32.7  RDW 13.2 13.3  PLT 243 223   Cardiac Enzymes Recent Labs  Lab 02/01/18 1937 02/02/18 0030 02/02/18 0624  TROPONINI 1.05* 0.81* 0.97*    Recent Labs  Lab 02/01/18 1553  TROPIPOC 1.55*    BNPNo results for input(s): BNP, PROBNP in the last 168 hours.  DDimer  Recent  Labs  Lab 02/01/18 1538  DDIMER <0.27    Radiology/Studies:  Dg Chest 2 View  Result Date: 02/01/2018 CLINICAL DATA:  Short of breath for several days, worse recently, post breathing treatment EXAM: CHEST - 2 VIEW COMPARISON:  None. FINDINGS: The lungs are markedly hyperaerated with increased AP diameter and flattened hemidiaphragms consistent with emphysema. No parenchymal infiltrate or pleural effusion is seen and no lung nodule is evident. Mediastinal and hilar contours are unremarkable. The heart is within normal limits in size. No bony abnormality is seen. IMPRESSION: Hyperaeration most consistent with emphysema. No active lung disease. Electronically Signed   By: Dwyane DeePaul  Barry M.D.   On: 02/01/2018 17:12    Assessment and Plan:   1. Acute respiratory distress likely driven by AEDCOPD - being managed by primary team. She reports she used Spiriva as an outpatient along with ProAir but it "stopped helping" recently. Although she was not reported to be hypoxic on arrival to ER, she has had intermittent drops in O2 here. D-dimer normal. Suspect tachycardia is driven by her underlying respiratory disease.  2. Elevated troponin - level appears higher than I would expect for simply soliatary demand ischemia, so I wonder if this represents a demand process superimposed on underlying CAD given longstanding tobacco abuse and elevated LDL. She denies any recent chest pain, however. She received 324mg  ASA yesterday. Will start ASA 81mg  daily along with statin. Hold off BB given AECOPD. I will review further workup with MD, including whether heparin drip would be indicated. Echocardiogram may be helpful for decision-making - result pending.  3. Tobacco abuse - congratulated her on recent quitting.  4. Underweight - pt appears malnourished. IM has consulted dietitian.  5. Hyperlipidemia - LDL 129. Would start statin given troponin elevation. Check CMET in AM. If the patient is tolerating statin at time of  follow-up appointment, would consider rechecking liver function/lipid panel in 6-8 weeks.  For questions or updates, please contact [facility] Please consult [email or web address] for contact info under Cardiology/STEMI.    Signed, Laurann Montanaayna N Serena Petterson, PA-C  02/02/2018 1:40 PM

## 2018-02-02 NOTE — Care Management Note (Signed)
Case Management Note  Patient Details  Name: Vickie GillesSylvia J Bowman MRN: [id number] Date of Birth: 14-Dec-1950  Subjective/Objective:                    Action/Plan:   Expected Discharge Date:  02/03/18               Expected Discharge Plan:  Home w Home Health Services(Independent from home)  In-House Referral:     Discharge planning Services  CM Consult  Post Acute Care Choice:    Choice offered to:  Patient  DME Arranged:  Nebulizer/meds DME Agency:  [facility]  [facility]     Status of Service:  In process, will continue to follow  If discussed at Long Length of Stay Meetings, dates discussed:    Additional Comments: Pt chose [facility] for DME - agency contacted and referral accepted [name], Corben Auzenne S, RN 02/02/2018, 5:31 PM

## 2018-02-03 DIAGNOSIS — I5021 Acute systolic (congestive) heart failure: Secondary | ICD-10-CM | POA: Diagnosis present

## 2018-02-03 DIAGNOSIS — I429 Cardiomyopathy, unspecified: Secondary | ICD-10-CM | POA: Diagnosis present

## 2018-02-03 DIAGNOSIS — J449 Chronic obstructive pulmonary disease, unspecified: Secondary | ICD-10-CM

## 2018-02-03 DIAGNOSIS — E785 Hyperlipidemia, unspecified: Secondary | ICD-10-CM

## 2018-02-03 DIAGNOSIS — I5181 Takotsubo syndrome: Secondary | ICD-10-CM | POA: Diagnosis not present

## 2018-02-03 DIAGNOSIS — I214 Non-ST elevation (NSTEMI) myocardial infarction: Secondary | ICD-10-CM

## 2018-02-03 DIAGNOSIS — R636 Underweight: Secondary | ICD-10-CM

## 2018-02-03 DIAGNOSIS — Z681 Body mass index (BMI) 19 or less, adult: Secondary | ICD-10-CM | POA: Diagnosis not present

## 2018-02-03 DIAGNOSIS — J9601 Acute respiratory failure with hypoxia: Secondary | ICD-10-CM

## 2018-02-03 DIAGNOSIS — I071 Rheumatic tricuspid insufficiency: Secondary | ICD-10-CM | POA: Diagnosis present

## 2018-02-03 DIAGNOSIS — F411 Generalized anxiety disorder: Secondary | ICD-10-CM | POA: Insufficient documentation

## 2018-02-03 DIAGNOSIS — J441 Chronic obstructive pulmonary disease with (acute) exacerbation: Secondary | ICD-10-CM

## 2018-02-03 DIAGNOSIS — F17201 Nicotine dependence, unspecified, in remission: Secondary | ICD-10-CM

## 2018-02-03 DIAGNOSIS — K5900 Constipation, unspecified: Secondary | ICD-10-CM | POA: Diagnosis present

## 2018-02-03 DIAGNOSIS — F419 Anxiety disorder, unspecified: Secondary | ICD-10-CM | POA: Diagnosis present

## 2018-02-03 DIAGNOSIS — I2510 Atherosclerotic heart disease of native coronary artery without angina pectoris: Secondary | ICD-10-CM | POA: Diagnosis present

## 2018-02-03 DIAGNOSIS — F410 Panic disorder [episodic paroxysmal anxiety] without agoraphobia: Secondary | ICD-10-CM | POA: Diagnosis present

## 2018-02-03 DIAGNOSIS — R0602 Shortness of breath: Secondary | ICD-10-CM | POA: Diagnosis present

## 2018-02-03 DIAGNOSIS — Z880 Allergy status to penicillin: Secondary | ICD-10-CM | POA: Diagnosis not present

## 2018-02-03 DIAGNOSIS — E43 Unspecified severe protein-calorie malnutrition: Secondary | ICD-10-CM | POA: Diagnosis present

## 2018-02-03 DIAGNOSIS — Z87891 Personal history of nicotine dependence: Secondary | ICD-10-CM | POA: Diagnosis not present

## 2018-02-03 DIAGNOSIS — R748 Abnormal levels of other serum enzymes: Secondary | ICD-10-CM | POA: Diagnosis not present

## 2018-02-03 DIAGNOSIS — R000 Tachycardia, unspecified: Secondary | ICD-10-CM | POA: Diagnosis present

## 2018-02-03 LAB — COMPREHENSIVE METABOLIC PANEL
ALBUMIN: 3.8 g/dL (ref 3.5–5.0)
ALT: 23 U/L (ref 0–44)
AST: 27 U/L (ref 15–41)
Alkaline Phosphatase: 73 U/L (ref 38–126)
Anion gap: 8 (ref 5–15)
BUN: 38 mg/dL — ABNORMAL HIGH (ref 8–23)
CHLORIDE: 103 mmol/L (ref 98–111)
CO2: 27 mmol/L (ref 22–32)
CREATININE: 0.86 mg/dL (ref 0.44–1.00)
Calcium: 9.5 mg/dL (ref 8.9–10.3)
GFR calc non Af Amer: >60 mL/min (ref >60)
GLUCOSE: 123 mg/dL — AB (ref 70–99)
Potassium: 4.2 mmol/L (ref 3.5–5.1)
SODIUM: 138 mmol/L (ref 135–145)
Total Bilirubin: 1 mg/dL (ref 0.3–1.2)
Total Protein: 5.9 g/dL — ABNORMAL LOW (ref 6.5–8.1)

## 2018-02-03 LAB — BRAIN NATRIURETIC PEPTIDE: B Natriuretic Peptide: 689.2 pg/mL — ABNORMAL HIGH (ref 0.0–100.0)

## 2018-02-03 MED ORDER — SENNOSIDES-DOCUSATE SODIUM 8.6-50 MG PO TABS
1.0000 | ORAL_TABLET | Freq: Every day | ORAL | Status: DC
  Administered 2018-02-03 – 2018-02-06 (×2): 1 via ORAL
  Filled 2018-02-03 (×3): qty 1

## 2018-02-03 MED ORDER — BISACODYL 10 MG RE SUPP: 10.0000 mg | Freq: Every day | RECTAL | Status: DC | PRN

## 2018-02-03 MED ORDER — MENTHOL 3 MG MT LOZG
1.0000 | LOZENGE | OROMUCOSAL | Status: DC | PRN
  Filled 2018-02-03: qty 9

## 2018-02-03 MED ORDER — DOCUSATE SODIUM 100 MG PO CAPS
100.0000 mg | ORAL_CAPSULE | Freq: Every day | ORAL | Status: DC | PRN
  Administered 2018-02-03: 100 mg via ORAL
  Filled 2018-02-03: qty 1

## 2018-02-03 NOTE — Progress Notes (Signed)
PROGRESS NOTE  TRENITA HULME  ZOX:[id number] DOB: 1951-03-14 DOA: 02/01/2018 PCP: Aida Puffer, MD   Brief Narrative: TELLY BROBERG is a 67 y.o. female with a long history of tobacco use, having quit 3 weeks ago, and recent clinical Dx of COPD who presented to the ED with progressive shortness of breath. Initially worse on exertion, dyspnea has become more constant and worsens despite inhaler use at home. She was found to be wheezing, improved with nebulizer therapies but had ongoing elevated work of breathing, improved with supplemental oxygen. Hemoglobin was 15.2, d-dimer undetectable. CXR with emphysematous changes without infiltrate. Troponin was elevated at 1.55 with sinus tachycardia on ECG. She was admitted, placed on steroids, scheduled and prn nebulizer treatments, and continued on supplemental oxygen.   Assessment & Plan: Active Problems:   SOB (shortness of breath)   Protein-calorie malnutrition, severe  Acute hypoxic respiratory failure due to acute COPD exacerbation and cardiomyopathy:  - Continue supplemental oxygen prn  AECOPD: - Continue steroids and scheduled nebs. prn xopenex to continue.  - Will need nebs at home. CM consulted - Will need pulmonology follow up with formal PFTs outside scope of acute exacerbation.   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 multivessel CAD for which she has risk factors and elevated troponin. BNP elevated - On lasix 20mg  IV BID - Daily weights, strict I/O, telemetry monitoring, daily BMP - LHC planned 9/16 per cardiology.   Troponin elevation:  - LHC planned - Started ASA, statin.   Hyperlipidemia: LDL 129.  - Start lipitor 40mg   Anxiety: Worsened by respiratory distress.  - Continue xanax prn. No sedation noted.  Tobacco use:  - Encouraged continued cessation.    Severe protein calorie malnutrition:  - Dietitian consulted, supplement protein as able  Constipation: Exam benign    - Start senna, prn dulcolax suppository.   DVT prophylaxis: Lovenox Code Status: Full Family Communication: None at bedside Disposition Plan: Home once work up complete and improved symptoms. Probably another 3-4 days.  Consultants:   Cardiology  Procedures:   Echocardiogram 02/02/2018: Study Conclusions  - Left ventricle: The cavity size was normal. Systolic function was   moderately reduced. The estimated ejection fraction was in the   range of 35% to 40%. Hyperdynamic basal segment with dyskinetic   mid-apical segments in all walls. Doppler parameters are   consistent with abnormal left ventricular relaxation (grade 1   diastolic dysfunction). - Aortic valve: Valve area (VTI): 2.68 cm^2. Valve area (Vmax): 3.3   cm^2. Valve area (Vmean): 2.72 cm^2. - Tricuspid valve: There was moderate regurgitation directed   centrally. - Pulmonary arteries: Systolic pressure was mildly increased. PA   peak pressure: 47 mm Hg (S).  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  Antimicrobials:  None   Subjective: No chest pain, breathing has not gotten any better from yesterday. Still severely dyspneic with exertion. Xanax has really helped with anxiety.   Objective: Vitals:   02/03/18 0640 02/03/18 0718 02/03/18 0829 02/03/18 1142  BP:  118/85  116/89  Pulse:  90  (!) 107  Resp:  (!) 29  (!) 22  Temp:  97.6 F (36.4 C)  98 F (36.7 C)  TempSrc:  Oral  Oral  SpO2:  94% 97% 93%  Weight: 47.5 kg     Height:  Intake/Output Summary (Last 24 hours) at 02/03/2018 1324 Last data filed at 02/03/2018 0640 Gross per 24 hour  Intake 560 ml  Output 1100 ml  Net -540 ml   Filed Weights   02/01/18 1532 02/01/18 2256 02/03/18 0640  Weight: 49.9 kg 48.7 kg 47.5 kg   Gen: Thin female in no distress Pulm:  Nonlabored tachypnea with supplemental oxygen. Diminished without wheezes or crackles. CV: Regular rate and rhythm. No murmur, rub, or gallop. No JVD, no dependent edema. GI: Abdomen soft, non-tender, non-distended, with normoactive bowel sounds.  Ext: Warm, no deformities Skin: No rashes, lesions or ulcers on visualized skin.  Neuro: Alert and oriented. No focal neurological deficits. Psych: Judgement and insight appear fair. Mood euthymic & affect congruent. Behavior is appropriate.    Data Reviewed: I have personally reviewed following labs and imaging studies  CBC: Recent Labs  Lab 02/01/18 1538 02/02/18 0624  WBC 9.1 8.5  HGB 15.2* 14.5  HCT 46.5* 44.4  MCV 95.3 94.5  PLT 243 223   Basic Metabolic Panel: Recent Labs  Lab 02/01/18 1538 02/02/18 0624 02/03/18 0247  NA 140 142 138  K 4.5 4.6 4.2  CL 104 108 103  CO2 23 25 27   GLUCOSE 124* 120* 123*  BUN 14 19 38*  CREATININE 0.94 0.85 0.86  CALCIUM 9.4 9.4 9.5   GFR: Estimated Creatinine Clearance: 46.3 mL/min (by C-G formula based on SCr of 0.86 mg/dL). Liver Function Tests: Recent Labs  Lab 02/03/18 0247  AST 27  ALT 23  ALKPHOS 73  BILITOT 1.0  PROT 5.9*  ALBUMIN 3.8   No results for input(s): LIPASE, AMYLASE in the last 168 hours. No results for input(s): AMMONIA in the last 168 hours. Coagulation Profile: No results for input(s): INR, PROTIME in the last 168 hours. Cardiac Enzymes: Recent Labs  Lab 02/01/18 1937 02/02/18 0030 02/02/18 0624  TROPONINI 1.05* 0.81* 0.97*   BNP (last 3 results) No results for input(s): PROBNP in the last 8760 hours. HbA1C: No results for input(s): HGBA1C in the last 72 hours. CBG: No results for input(s): GLUCAP in the last 168 hours. Lipid Profile: Recent Labs    02/02/18 0624  CHOL 207*  HDL 69  LDLCALC 129*  TRIG 43  CHOLHDL 3.0   Thyroid Function Tests: Recent Labs    02/01/18 1937  TSH 0.870   Anemia Panel: No results for input(s):  VITAMINB12, FOLATE, FERRITIN, TIBC, IRON, RETICCTPCT in the last 72 hours. Urine analysis: No results found for: COLORURINE, APPEARANCEUR, LABSPEC, PHURINE, GLUCOSEU, HGBUR, BILIRUBINUR, KETONESUR, PROTEINUR, UROBILINOGEN, NITRITE, LEUKOCYTESUR Recent Results (from the past 240 hour(s))  MRSA PCR Screening     Status: None   Collection Time: 02/01/18 11:51 PM  Result Value Ref Range Status   MRSA by PCR NEGATIVE NEGATIVE Final    Comment:        The GeneXpert MRSA Assay (FDA approved for NASAL specimens only), is one component of a comprehensive MRSA colonization surveillance program. It is not intended to diagnose MRSA infection nor to guide or monitor treatment for MRSA infections. Performed at [facility], [address] [place], [place] [address]       Radiology Studies: Dg Chest 2 View  Result Date: 02/01/2018 CLINICAL DATA:  Short of breath for several days, worse recently, post breathing treatment EXAM: CHEST - 2 VIEW COMPARISON:  None. FINDINGS: The lungs are markedly hyperaerated with increased AP diameter and flattened hemidiaphragms consistent with emphysema. No parenchymal infiltrate or pleural  effusion is seen and no lung nodule is evident. Mediastinal and hilar contours are unremarkable. The heart is within normal limits in size. No bony abnormality is seen. IMPRESSION: Hyperaeration most consistent with emphysema. No active lung disease. Electronically Signed   By: Dwyane DeePaul  Barry M.D.   On: 02/01/2018 17:12    Scheduled Meds: . aspirin EC  81 mg Oral Daily  . atorvastatin  40 mg Oral q1800  . enoxaparin (LOVENOX) injection  40 mg Subcutaneous Daily  . feeding supplement (ENSURE ENLIVE)  237 mL Oral BID BM  . furosemide  20 mg Intravenous BID  . ipratropium-albuterol  3 mL Nebulization TID  . multivitamin with minerals  1 tablet Oral Daily  . predniSONE  60 mg Oral QAC breakfast  . senna-docusate  1 tablet Oral Daily   Continuous Infusions:   LOS: 0 days    Time spent: 25 minutes.  Tyrone Nineyan B Calah Gershman, MD [facility] [email or web address] Password [email or web address] 02/03/2018, 1:24 PM

## 2018-02-03 NOTE — Progress Notes (Addendum)
Progress Note  Patient Name: Vickie Bowman Date of Encounter: 02/03/2018  Primary Cardiologist: Chrystie NoseKenneth C Hilty, MD   Subjective   The patient denies chest pain.  She has shortness of breath.  She denies leg swelling.  O2 saturations were 88% on 5 L and she was just placed on a nonrebreather with sats at 96%. She was anxious earlier and said she has panic attacks and took a Xanax and feels better now.  Inpatient Medications    Scheduled Meds: . aspirin EC  81 mg Oral Daily  . atorvastatin  40 mg Oral q1800  . enoxaparin (LOVENOX) injection  40 mg Subcutaneous Daily  . feeding supplement (ENSURE ENLIVE)  237 mL Oral BID BM  . furosemide  20 mg Intravenous BID  . ipratropium-albuterol  3 mL Nebulization TID  . multivitamin with minerals  1 tablet Oral Daily  . predniSONE  60 mg Oral QAC breakfast  . senna-docusate  1 tablet Oral Daily   Continuous Infusions:  PRN Meds: acetaminophen **OR** acetaminophen, ALPRAZolam, bisacodyl, docusate sodium, levalbuterol, menthol-cetylpyridinium   Vital Signs    Vitals:   02/03/18 0640 02/03/18 0718 02/03/18 0829 02/03/18 1142  BP:  118/85  116/89  Pulse:  90  (!) 107  Resp:  (!) 29  (!) 22  Temp:  97.6 F (36.4 C)  98 F (36.7 C)  TempSrc:  Oral  Oral  SpO2:  94% 97% 93%  Weight: 47.5 kg     Height:        Intake/Output Summary (Last 24 hours) at 02/03/2018 1426 Last data filed at 02/03/2018 0640 Gross per 24 hour  Intake 560 ml  Output 1100 ml  Net -540 ml   Filed Weights   02/01/18 1532 02/01/18 2256 02/03/18 0640  Weight: 49.9 kg 48.7 kg 47.5 kg    Telemetry    Sinus tachycardia- Personally Reviewed  ECG    No new tracings- Personally Reviewed  Physical Exam   GEN: No acute distress.   Neck: No JVD Cardiac:  Tachycardic, regular rhythm, no murmurs, rubs, or gallops.  Respiratory:  Diminished sounds throughout, no crackles or wheezes. GI: Soft, nontender, non-distended  MS: No edema; No deformity. Neuro:   Nonfocal  Psych: Normal affect   Labs    Chemistry Recent Labs  Lab 02/01/18 1538 02/02/18 0624 02/03/18 0247  NA 140 142 138  K 4.5 4.6 4.2  CL 104 108 103  CO2 23 25 27   GLUCOSE 124* 120* 123*  BUN 14 19 38*  CREATININE 0.94 0.85 0.86  CALCIUM 9.4 9.4 9.5  PROT  --   --  5.9*  ALBUMIN  --   --  3.8  AST  --   --  27  ALT  --   --  23  ALKPHOS  --   --  73  BILITOT  --   --  1.0  GFRNONAA >60 >60 >60  GFRAA >60 >60 >60  ANIONGAP 13 9 8      Hematology Recent Labs  Lab 02/01/18 1538 02/02/18 0624  WBC 9.1 8.5  RBC 4.88 4.70  HGB 15.2* 14.5  HCT 46.5* 44.4  MCV 95.3 94.5  MCH 31.1 30.9  MCHC 32.7 32.7  RDW 13.2 13.3  PLT 243 223    Cardiac Enzymes Recent Labs  Lab 02/01/18 1937 02/02/18 0030 02/02/18 0624  TROPONINI 1.05* 0.81* 0.97*    Recent Labs  Lab 02/01/18 1553  TROPIPOC 1.55*     BNP Recent Labs  Lab 02/03/18 0247  BNP 689.2*     DDimer  Recent Labs  Lab 02/01/18 1538  DDIMER <0.27     Radiology    Dg Chest 2 View  Result Date: 02/01/2018 CLINICAL DATA:  Short of breath for several days, worse recently, post breathing treatment EXAM: CHEST - 2 VIEW COMPARISON:  None. FINDINGS: The lungs are markedly hyperaerated with increased AP diameter and flattened hemidiaphragms consistent with emphysema. No parenchymal infiltrate or pleural effusion is seen and no lung nodule is evident. Mediastinal and hilar contours are unremarkable. The heart is within normal limits in size. No bony abnormality is seen. IMPRESSION: Hyperaeration most consistent with emphysema. No active lung disease. Electronically Signed   By: Dwyane Dee M.D.   On: 02/01/2018 17:12    Cardiac Studies   Echocardiogram 02/02/2018:  - Left ventricle: The cavity size was normal. Systolic function was   moderately reduced. The estimated ejection fraction was in the   range of 35% to 40%. Hyperdynamic basal segment with dyskinetic   mid-apical segments in all walls.  Doppler parameters are   consistent with abnormal left ventricular relaxation (grade 1   diastolic dysfunction). - Aortic valve: Valve area (VTI): 2.68 cm^2. Valve area (Vmax): 3.3   cm^2. Valve area (Vmean): 2.72 cm^2. - Tricuspid valve: There was moderate regurgitation directed   centrally. - Pulmonary arteries: Systolic pressure was mildly increased. PA   peak pressure: 47 mm Hg (S).  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  Patient Profile     67 y.o. female with a hx of 50 years tobacco abuse (recently quit), COPD, anxiety and herniated disc who is being seen today for the evaluation of elevated troponin (1.55) at the request of Dr. Jarvis Newcomer.  Assessment & Plan     1. Acute respiratory distress likely driven by AEDCOPD - being managed by primary team. She reports she used Spiriva as an outpatient along with ProAir but it "stopped helping" recently.  She was hypoxic on 5 L nasal cannula and is now on a nonrebreather with oxygen saturations of 96%. D-dimer normal. Suspect tachycardia is driven by her underlying respiratory disease.  2. Elevated troponin/acute coronary syndrome/non-STEMI with new cardiomyopathy-given moderately reduced LVEF, long-standing tobacco abuse, and elevated LDL, she quite likely has obstructive coronary artery disease.  Left heart catheterization and coronary angiography has been arranged for 02/05/2018.  Continue aspirin and atorvastatin 40 mg.  3. Tobacco abuse - congratulated her on recent quitting.  4. Underweight - pt appears malnourished. IM has consulted dietitian.  They have recommended multivitamin daily along with Ensure Enlive twice daily.  5. Hyperlipidemia - LDL 129.  Given troponin elevation consistent with acute coronary syndrome, atorvastatin 40 mg has been started.     Lipids can be repeated in the outpatient setting in several weeks.  6.  Acute systolic heart failure: Currently on IV Lasix 20 mg twice daily with 540 cc output in last 24 hours.  BNP 689.  BUN up to 38 from 19 yesterday and creatinine is stable at 0.86.  She does not appear volume overloaded.  I will hold further Lasix today.  She has already received 1 dose.   For questions or updates, please contact [facility] Please consult [email or web address] for contact info under Cardiology/STEMI.      Signed, Prentice Docker, MD  02/03/2018, 2:26 PM

## 2018-02-03 NOTE — Progress Notes (Signed)
Pt sats in 88%  On 5L  Pt complaining of SOB  .  Placed on non rebreather. sats 96%

## 2018-02-04 DIAGNOSIS — E43 Unspecified severe protein-calorie malnutrition: Secondary | ICD-10-CM

## 2018-02-04 DIAGNOSIS — F419 Anxiety disorder, unspecified: Secondary | ICD-10-CM

## 2018-02-04 LAB — BASIC METABOLIC PANEL
Anion gap: 10 (ref 5–15)
BUN: 33 mg/dL — AB (ref 8–23)
CALCIUM: 9.5 mg/dL (ref 8.9–10.3)
CO2: 28 mmol/L (ref 22–32)
CREATININE: 0.86 mg/dL (ref 0.44–1.00)
Chloride: 101 mmol/L (ref 98–111)
GFR calc Af Amer: >60 mL/min (ref >60)
GLUCOSE: 112 mg/dL — AB (ref 70–99)
Potassium: 4.5 mmol/L (ref 3.5–5.1)
SODIUM: 139 mmol/L (ref 135–145)

## 2018-02-04 MED ORDER — SODIUM CHLORIDE 0.9% FLUSH
3.0000 mL | Freq: Two times a day (BID) | INTRAVENOUS | Status: DC
  Administered 2018-02-05: 3 mL via INTRAVENOUS

## 2018-02-04 MED ORDER — SODIUM CHLORIDE 0.9 % IV SOLN
INTRAVENOUS | Status: DC
  Administered 2018-02-05: 06:00:00 via INTRAVENOUS

## 2018-02-04 MED ORDER — SODIUM CHLORIDE 0.9 % IV SOLN: 250.0000 mL | INTRAVENOUS | Status: DC | PRN

## 2018-02-04 MED ORDER — ASPIRIN 81 MG PO CHEW
81.0000 mg | CHEWABLE_TABLET | ORAL | Status: AC
  Administered 2018-02-05: 81 mg via ORAL
  Filled 2018-02-04: qty 1

## 2018-02-04 MED ORDER — SODIUM CHLORIDE 0.9% FLUSH: 3.0000 mL | INTRAVENOUS | Status: DC | PRN

## 2018-02-04 NOTE — Progress Notes (Signed)
Patient reported to have a 2 min run of Aflutter, Ekg done showed ST, Dr informed via amnion. Patient appears stable no adverse symptoms noted, refused to watch  Video for cardiac cath.for procedure tomorrow. Care continues.

## 2018-02-04 NOTE — Progress Notes (Signed)
PROGRESS NOTE  Vickie Bowman ZOX:[id number] DOB: 01/29/1951 DOA: 02/01/2018 PCP: Aida Puffer, MD  HPI/Recap of past 24 hours: Vickie Bowman is a 67 y.o. female with a long history of tobacco use, having quit 3 weeks ago, and recent clinical Dx of COPD who presented to the ED with progressive shortness of breath. Initially worse on exertion, dyspnea has become more constant and worsens despite inhaler use at home. She was found to be wheezing, improved with nebulizer therapies but had ongoing elevated work of breathing, improved with supplemental oxygen. Hemoglobin was 15.2, d-dimer undetectable. CXR with emphysematous changes without infiltrate. Troponin was elevated at 1.55 with sinus tachycardia on ECG. She was admitted, placed on steroids, scheduled and prn nebulizer treatments, and continued on supplemental oxygen.   02/04/2018: Patient seen and examined her bedside.  Prior to her breathing treatment, she reports conversational dyspnea.  Denies chest pain or palpitations.  Cardiology following planned for left heart cath tomorrow 02/05/18  Assessment/Plan: Principal Problem:   Acute respiratory failure with hypoxia (HCC) Active Problems:   Protein-calorie malnutrition, severe   COPD exacerbation (HCC)   Anxiety   Acute HFrEF (heart failure with reduced ejection fraction) (HCC)   Non-ST elevation (NSTEMI) myocardial infarction (HCC)   Hyperlipidemia LDL goal <70   Underweight due to inadequate caloric intake   Tobacco abuse, in remission   Cardiomyopathy (HCC)  Acute hypoxic respiratory failure most likely multifactorial secondary to acute COPD exacerbation and acute exacerbation of heart failure with reduced EF  - Continue supplemental oxygen prn - Continue breathing treatments - Continue current cardiac medications - Obtain home O2 evaluation prior to discharge  AECOPD: - Management as stated above -Will need nebs machine at discharge - Will need pulmonology follow up with  formal PFTs outside scope of acute exacerbation.   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 multivessel CAD for which she has risk factors and elevated troponin. BNP elevated - Lasix has been discontinued on 02/03/2018 by cardiology as the patient is euvolemic  -Continue Daily weights, strict I/O, telemetry monitoring, daily BMP - LHC planned 9/16 per cardiology.   Troponin elevation:  - ACS suspected - LHC planned -Continue ASA, statin.   Hyperlipidemia: LDL 129.  -Continue Lipitor 40mg   Anxiety: Worsened by respiratory distress.  - Continue xanax prn. No sedation noted.  Tobacco use:  - Encouraged continued cessation.    Severe protein calorie malnutrition:  - Dietitian consulted, supplement protein as able  Constipation: Exam benign  -Continue senna, prn dulcolax suppository.     Code Status: Full code  Family Communication: None at bedside  Disposition Plan: Possibly tomorrow 02/05/2018 post left heart cath or when cardiology signs off   Consultants:  Cardiology  Procedures:  None  Antimicrobials:  None  DVT prophylaxis: Subcu Lovenox daily   Objective: Vitals:   02/04/18 0500 02/04/18 0736 02/04/18 0818 02/04/18 1046  BP:   115/76 (!) 105/91  Pulse:   97 99  Resp:   (!) 22 (!) 24  Temp:   97.8 F (36.6 C) 97.9 F (36.6 C)  TempSrc:   Oral Oral  SpO2:  94% 95% 96%  Weight: 47.4 kg     Height:        Intake/Output Summary (Last 24 hours) at 02/04/2018 1053 Last data filed at 02/04/2018 1047 Gross per 24 hour  Intake 480 ml  Output 300 ml  Net 180 ml   Filed Weights   02/01/18 2256 02/03/18 0640  02/04/18 0500  Weight: 48.7 kg 47.5 kg 47.4 kg    Exam:  . General: 67 y.o. year-old female frail noted conversational dyspnea.  Alert and oriented x3. . Cardiovascular: Regular rate and rhythm with no rubs or gallops.  No thyromegaly or JVD noted.   Marland Kitchen Respiratory: Clear to auscultation with no  wheezes or rales. Good inspiratory effort. . Abdomen: Soft nontender nondistended with normal bowel sounds x4 quadrants. . Musculoskeletal: No lower extremity edema. 2/4 pulses in all 4 extremities. . Skin: No ulcerative lesions noted or rashes, . Psychiatry: Mood is appropriate for condition and setting   Data Reviewed: CBC: Recent Labs  Lab 02/01/18 1538 02/02/18 0624  WBC 9.1 8.5  HGB 15.2* 14.5  HCT 46.5* 44.4  MCV 95.3 94.5  PLT 243 223   Basic Metabolic Panel: Recent Labs  Lab 02/01/18 1538 02/02/18 0624 02/03/18 0247 02/04/18 0240  NA 140 142 138 139  K 4.5 4.6 4.2 4.5  CL 104 108 103 101  CO2 23 25 27 28   GLUCOSE 124* 120* 123* 112*  BUN 14 19 38* 33*  CREATININE 0.94 0.85 0.86 0.86  CALCIUM 9.4 9.4 9.5 9.5   GFR: Estimated Creatinine Clearance: 46.2 mL/min (by C-G formula based on SCr of 0.86 mg/dL). Liver Function Tests: Recent Labs  Lab 02/03/18 0247  AST 27  ALT 23  ALKPHOS 73  BILITOT 1.0  PROT 5.9*  ALBUMIN 3.8   No results for input(s): LIPASE, AMYLASE in the last 168 hours. No results for input(s): AMMONIA in the last 168 hours. Coagulation Profile: No results for input(s): INR, PROTIME in the last 168 hours. Cardiac Enzymes: Recent Labs  Lab 02/01/18 1937 02/02/18 0030 02/02/18 0624  TROPONINI 1.05* 0.81* 0.97*   BNP (last 3 results) No results for input(s): PROBNP in the last 8760 hours. HbA1C: No results for input(s): HGBA1C in the last 72 hours. CBG: No results for input(s): GLUCAP in the last 168 hours. Lipid Profile: Recent Labs    02/02/18 0624  CHOL 207*  HDL 69  LDLCALC 129*  TRIG 43  CHOLHDL 3.0   Thyroid Function Tests: Recent Labs    02/01/18 1937  TSH 0.870   Anemia Panel: No results for input(s): VITAMINB12, FOLATE, FERRITIN, TIBC, IRON, RETICCTPCT in the last 72 hours. Urine analysis: No results found for: COLORURINE, APPEARANCEUR, LABSPEC, PHURINE, GLUCOSEU, HGBUR, BILIRUBINUR, KETONESUR, PROTEINUR,  UROBILINOGEN, NITRITE, LEUKOCYTESUR Sepsis Labs: @LABRCNTIP (procalcitonin:4,lacticidven:4)  ) Recent Results (from the past 240 hour(s))  MRSA PCR Screening     Status: None   Collection Time: 02/01/18 11:51 PM  Result Value Ref Range Status   MRSA by PCR NEGATIVE NEGATIVE Final    Comment:        The GeneXpert MRSA Assay (FDA approved for NASAL specimens only), is one component of a comprehensive MRSA colonization surveillance program. It is not intended to diagnose MRSA infection nor to guide or monitor treatment for MRSA infections. Performed at [facility], [address] [place], [place] [address]       Studies: No results found.  Scheduled Meds: . aspirin EC  81 mg Oral Daily  . atorvastatin  40 mg Oral q1800  . enoxaparin (LOVENOX) injection  40 mg Subcutaneous Daily  . feeding supplement (ENSURE ENLIVE)  237 mL Oral BID BM  . ipratropium-albuterol  3 mL Nebulization TID  . multivitamin with minerals  1 tablet Oral Daily  . senna-docusate  1 tablet Oral Daily    Continuous Infusions:  LOS: 1 day     Darlin Droparole N Hall, MD [facility] Pager [phone]  If 7PM-7AM, please contact night-coverage [email or web address] Password [email or web address] 02/04/2018, 10:53 AM

## 2018-02-04 NOTE — Progress Notes (Signed)
Progress Note  Patient Name: Vickie Bowman Date of Encounter: 02/04/2018  Primary Cardiologist: Chrystie NoseKenneth C Hilty, MD   Subjective   She is doing well this morning.  She denies chest pain and said her shortness of breath has improved.  She says she slept very well.  She is sitting in a chair comfortably eating breakfast.  Inpatient Medications    Scheduled Meds: . aspirin EC  81 mg Oral Daily  . atorvastatin  40 mg Oral q1800  . enoxaparin (LOVENOX) injection  40 mg Subcutaneous Daily  . feeding supplement (ENSURE ENLIVE)  237 mL Oral BID BM  . ipratropium-albuterol  3 mL Nebulization TID  . multivitamin with minerals  1 tablet Oral Daily  . senna-docusate  1 tablet Oral Daily   Continuous Infusions:  PRN Meds: acetaminophen **OR** acetaminophen, ALPRAZolam, bisacodyl, docusate sodium, levalbuterol, menthol-cetylpyridinium   Vital Signs    Vitals:   02/04/18 0339 02/04/18 0500 02/04/18 0736 02/04/18 0818  BP: 102/84   115/76  Pulse: 79   97  Resp: (!) 22   (!) 22  Temp: 97.7 F (36.5 C)   97.8 F (36.6 C)  TempSrc: Oral   Oral  SpO2: 91%  94% 95%  Weight:  47.4 kg    Height:        Intake/Output Summary (Last 24 hours) at 02/04/2018 0928 Last data filed at 02/04/2018 0819 Gross per 24 hour  Intake 240 ml  Output 300 ml  Net -60 ml   Filed Weights   02/01/18 2256 02/03/18 0640 02/04/18 0500  Weight: 48.7 kg 47.5 kg 47.4 kg    Telemetry    Sinus rhythm and sinus tachycardia with rare PVCs- Personally Reviewed  ECG    No new tracings- Personally Reviewed  Physical Exam   GEN: No acute distress.   Neck: No JVD Cardiac:  Tachycardic, regular, no murmurs, rubs, or gallops.  Respiratory:  Faint bilateral inspiratory and expiratory wheezes, no crackles. GI: Soft, nontender, non-distended  MS: No edema; No deformity. Neuro:  Nonfocal  Psych: Normal affect, very pleasant.  Labs    Chemistry Recent Labs  Lab 02/02/18 0624 02/03/18 0247  02/04/18 0240  NA 142 138 139  K 4.6 4.2 4.5  CL 108 103 101  CO2 25 27 28   GLUCOSE 120* 123* 112*  BUN 19 38* 33*  CREATININE 0.85 0.86 0.86  CALCIUM 9.4 9.5 9.5  PROT  --  5.9*  --   ALBUMIN  --  3.8  --   AST  --  27  --   ALT  --  23  --   ALKPHOS  --  73  --   BILITOT  --  1.0  --   GFRNONAA >60 >60 >60  GFRAA >60 >60 >60  ANIONGAP 9 8 10      Hematology Recent Labs  Lab 02/01/18 1538 02/02/18 0624  WBC 9.1 8.5  RBC 4.88 4.70  HGB 15.2* 14.5  HCT 46.5* 44.4  MCV 95.3 94.5  MCH 31.1 30.9  MCHC 32.7 32.7  RDW 13.2 13.3  PLT 243 223    Cardiac Enzymes Recent Labs  Lab 02/01/18 1937 02/02/18 0030 02/02/18 0624  TROPONINI 1.05* 0.81* 0.97*    Recent Labs  Lab 02/01/18 1553  TROPIPOC 1.55*     BNP Recent Labs  Lab 02/03/18 0247  BNP 689.2*     DDimer  Recent Labs  Lab 02/01/18 1538  DDIMER <0.27     Radiology  No results found.  Cardiac Studies   Echocardiogram 02/02/2018:  - Left ventricle: The cavity size was normal. Systolic function was moderately reduced. The estimated ejection fraction was in the range of 35% to 40%. Hyperdynamic basal segment with dyskinetic mid-apical segments in all walls. Doppler parameters are consistent with abnormal left ventricular relaxation (grade 1 diastolic dysfunction). - Aortic valve: Valve area (VTI): 2.68 cm^2. Valve area (Vmax): 3.3 cm^2. Valve area (Vmean): 2.72 cm^2. - Tricuspid valve: There was moderate regurgitation directed centrally. - Pulmonary arteries: Systolic pressure was mildly increased. PA peak pressure: 47 mm Hg (S).  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  Patient Profile     67 y.o. female with a hx of 50 years tobacco abuse (recently quit), COPD, anxiety and herniated discwho is  being seen today for the evaluation of elevated troponin (1.55)at the request of Dr. Jarvis Newcomer.  Assessment & Plan    1.Acute respiratory distress likely driven by AEDCOPD -being managed by primary team. She reports she used Spiriva as an outpatient along with ProAir but it "stopped helping" recently.    O2 sats 95% with supplemental oxygen. D-dimer normal. Suspect tachycardia is driven by her underlying respiratory disease.  2. Elevated troponin/acute coronary syndrome/non-STEMI with new cardiomyopathy-given moderately reduced LVEF, long-standing tobacco abuse, and elevated LDL, she quite likely has obstructive coronary artery disease.  Left heart catheterization and coronary angiography has been arranged for 02/05/2018.  Continue aspirin and atorvastatin 40 mg.  No beta-blockers due to COPD exacerbation.  3. Tobacco abuse-she recently quit.  4. Underweight -pt appears malnourished. IM has consulted dietitian.  They have recommended multivitamin daily along with Ensure Enlive twice daily.  5. Hyperlipidemia - LDL 129.  Given troponin elevation consistent with acute coronary syndrome, atorvastatin 40 mg has been started.   Lipids can be repeated in the outpatient setting in several weeks.  6.  Acute systolic heart failure:  I stopped Lasix yesterday. She does not appear volume overloaded.   For questions or updates, please contact [facility] Please consult [email or web address] for contact info under Cardiology/STEMI.      Signed, Prentice Docker, MD  02/04/2018, 9:28 AM

## 2018-02-05 ENCOUNTER — Encounter (HOSPITAL_COMMUNITY)
Admission: EM | Disposition: A | Discharge status: Home / Self Care | Payer: Self-pay | Source: Home / Self Care | Attending: Family Medicine

## 2018-02-05 ENCOUNTER — Encounter (HOSPITAL_COMMUNITY): Payer: Self-pay | Admitting: Internal Medicine

## 2018-02-05 DIAGNOSIS — R7989 Other specified abnormal findings of blood chemistry: Secondary | ICD-10-CM

## 2018-02-05 DIAGNOSIS — I420 Dilated cardiomyopathy: Secondary | ICD-10-CM

## 2018-02-05 DIAGNOSIS — R778 Other specified abnormalities of plasma proteins: Secondary | ICD-10-CM

## 2018-02-05 HISTORY — PX: LEFT HEART CATH AND CORONARY ANGIOGRAPHY: CATH118249

## 2018-02-05 LAB — BASIC METABOLIC PANEL
Anion gap: 8 (ref 5–15)
BUN: 27 mg/dL — AB (ref 8–23)
CO2: 30 mmol/L (ref 22–32)
Calcium: 9 mg/dL (ref 8.9–10.3)
Chloride: 102 mmol/L (ref 98–111)
Creatinine, Ser: 0.81 mg/dL (ref 0.44–1.00)
GFR calc Af Amer: >60 mL/min (ref >60)
GLUCOSE: 113 mg/dL — AB (ref 70–99)
Potassium: 4 mmol/L (ref 3.5–5.1)
Sodium: 140 mmol/L (ref 135–145)

## 2018-02-05 LAB — CBC
HCT: 45.8 % (ref 36.0–46.0)
Hemoglobin: 14.8 g/dL (ref 12.0–15.0)
MCH: 30.6 pg (ref 26.0–34.0)
MCHC: 32.3 g/dL (ref 30.0–36.0)
MCV: 94.6 fL (ref 78.0–100.0)
Platelets: 213 10*3/uL (ref 150–400)
RBC: 4.84 MIL/uL (ref 3.87–5.11)
RDW: 13 % (ref 11.5–15.5)
WBC: 9.6 10*3/uL (ref 4.0–10.5)

## 2018-02-05 LAB — PHOSPHORUS: PHOSPHORUS: 4.1 mg/dL (ref 2.5–4.6)

## 2018-02-05 LAB — MAGNESIUM: MAGNESIUM: 2.5 mg/dL — AB (ref 1.7–2.4)

## 2018-02-05 SURGERY — LEFT HEART CATH AND CORONARY ANGIOGRAPHY
Anesthesia: LOCAL

## 2018-02-05 MED ORDER — HEPARIN (PORCINE) IN NACL 1000-0.9 UT/500ML-% IV SOLN
INTRAVENOUS | Status: AC
  Filled 2018-02-05: qty 1000

## 2018-02-05 MED ORDER — HEPARIN (PORCINE) IN NACL 1000-0.9 UT/500ML-% IV SOLN
INTRAVENOUS | Status: AC
  Filled 2018-02-05: qty 500

## 2018-02-05 MED ORDER — MIDAZOLAM HCL 2 MG/2ML IJ SOLN
INTRAMUSCULAR | Status: AC
  Filled 2018-02-05: qty 2

## 2018-02-05 MED ORDER — LIDOCAINE HCL (PF) 1 % IJ SOLN
INTRAMUSCULAR | Status: AC
  Filled 2018-02-05: qty 30

## 2018-02-05 MED ORDER — IOHEXOL 350 MG/ML SOLN
INTRAVENOUS | Status: DC | PRN
  Administered 2018-02-05: 30 mL via INTRA_ARTERIAL

## 2018-02-05 MED ORDER — GUAIFENESIN-DM 100-10 MG/5ML PO SYRP
5.0000 mL | ORAL_SOLUTION | ORAL | Status: DC | PRN
  Administered 2018-02-05: 5 mL via ORAL
  Filled 2018-02-05: qty 5

## 2018-02-05 MED ORDER — SODIUM CHLORIDE 0.9 % IV SOLN: INTRAVENOUS | Status: AC

## 2018-02-05 MED ORDER — IPRATROPIUM-ALBUTEROL 0.5-2.5 (3) MG/3ML IN SOLN
3.0000 mL | Freq: Two times a day (BID) | RESPIRATORY_TRACT | Status: DC
  Administered 2018-02-06: 3 mL via RESPIRATORY_TRACT
  Filled 2018-02-05: qty 3

## 2018-02-05 MED ORDER — VERAPAMIL HCL 2.5 MG/ML IV SOLN
INTRAVENOUS | Status: AC
  Filled 2018-02-05: qty 2

## 2018-02-05 MED ORDER — SODIUM CHLORIDE 0.9% FLUSH: 3.0000 mL | INTRAVENOUS | Status: DC | PRN

## 2018-02-05 MED ORDER — SODIUM CHLORIDE 0.9 % IV SOLN: 250.0000 mL | INTRAVENOUS | Status: DC | PRN

## 2018-02-05 MED ORDER — LIDOCAINE HCL (PF) 1 % IJ SOLN
INTRAMUSCULAR | Status: DC | PRN
  Administered 2018-02-05: 2 mL via INTRADERMAL

## 2018-02-05 MED ORDER — HEPARIN SODIUM (PORCINE) 1000 UNIT/ML IJ SOLN
INTRAMUSCULAR | Status: AC
  Filled 2018-02-05: qty 1

## 2018-02-05 MED ORDER — VERAPAMIL HCL 2.5 MG/ML IV SOLN
INTRAVENOUS | Status: DC | PRN
  Administered 2018-02-05: 10 mL via INTRA_ARTERIAL

## 2018-02-05 MED ORDER — SODIUM CHLORIDE 0.9% FLUSH: 3.0000 mL | Freq: Two times a day (BID) | INTRAVENOUS | Status: DC

## 2018-02-05 MED ORDER — MIDAZOLAM HCL 2 MG/2ML IJ SOLN
INTRAMUSCULAR | Status: DC | PRN
  Administered 2018-02-05: 1 mg via INTRAVENOUS

## 2018-02-05 MED ORDER — METOPROLOL TARTRATE 12.5 MG HALF TABLET
12.5000 mg | ORAL_TABLET | Freq: Two times a day (BID) | ORAL | Status: DC
  Administered 2018-02-05: 12.5 mg via ORAL
  Filled 2018-02-05: qty 1

## 2018-02-05 MED ORDER — FENTANYL CITRATE (PF) 100 MCG/2ML IJ SOLN
INTRAMUSCULAR | Status: AC
  Filled 2018-02-05: qty 2

## 2018-02-05 MED ORDER — FENTANYL CITRATE (PF) 100 MCG/2ML IJ SOLN
INTRAMUSCULAR | Status: DC | PRN
  Administered 2018-02-05: 25 ug via INTRAVENOUS

## 2018-02-05 MED ORDER — ENOXAPARIN SODIUM 40 MG/0.4ML ~~LOC~~ SOLN: 40.0000 mg | SUBCUTANEOUS | Status: DC

## 2018-02-05 MED ORDER — HEPARIN SODIUM (PORCINE) 1000 UNIT/ML IJ SOLN
INTRAMUSCULAR | Status: DC | PRN
  Administered 2018-02-05: 2500 [IU] via INTRAVENOUS

## 2018-02-05 MED ORDER — HEPARIN (PORCINE) IN NACL 1000-0.9 UT/500ML-% IV SOLN
INTRAVENOUS | Status: DC | PRN
  Administered 2018-02-05 (×2): 500 mL

## 2018-02-05 SURGICAL SUPPLY — 11 items
CATH 5FR JL3.5 JR4 ANG PIG MP (CATHETERS) ×2 IMPLANT
DEVICE RAD TR BAND REGULAR (VASCULAR PRODUCTS) ×2 IMPLANT
GLIDESHEATH SLEND SS 6F .021 (SHEATH) ×2 IMPLANT
GUIDEWIRE INQWIRE 1.5J.035X260 (WIRE) ×1 IMPLANT
INQWIRE 1.5J .035X260CM (WIRE) ×2
KIT HEART LEFT (KITS) ×2 IMPLANT
PACK CARDIAC CATHETERIZATION (CUSTOM PROCEDURE TRAY) ×2 IMPLANT
SHEATH PROBE COVER 6X72 (BAG) ×2 IMPLANT
SYR MEDRAD MARK V 150ML (SYRINGE) ×2 IMPLANT
TRANSDUCER W/STOPCOCK (MISCELLANEOUS) ×2 IMPLANT
TUBING CIL FLEX 10 FLL-RA (TUBING) ×2 IMPLANT

## 2018-02-05 NOTE — Progress Notes (Signed)
 Progress Note  Patient Name: Vickie Bowman Date of Encounter: 02/05/2018  Primary Cardiologist: Kenneth C Hilty, MD   Subjective   No chest pain or dyspnea  Inpatient Medications    Scheduled Meds: . aspirin EC  81 mg Oral Daily  . atorvastatin  40 mg Oral q1800  . enoxaparin (LOVENOX) injection  40 mg Subcutaneous Daily  . feeding supplement (ENSURE ENLIVE)  237 mL Oral BID BM  . ipratropium-albuterol  3 mL Nebulization TID  . multivitamin with minerals  1 tablet Oral Daily  . senna-docusate  1 tablet Oral Daily  . sodium chloride flush  3 mL Intravenous Q12H   Continuous Infusions: . sodium chloride    . sodium chloride 50 mL/hr at 02/05/18 0628   PRN Meds: sodium chloride, acetaminophen **OR** acetaminophen, ALPRAZolam, bisacodyl, docusate sodium, levalbuterol, menthol-cetylpyridinium, sodium chloride flush   Vital Signs    Vitals:   02/04/18 1527 02/04/18 1543 02/04/18 2009 02/05/18 0434  BP: 122/80  131/86   Pulse: 100  (!) 118   Resp: (!) 31  (!) 32   Temp: 98 F (36.7 C)  97.8 F (36.6 C) 97.9 F (36.6 C)  TempSrc: Oral  Oral Oral  SpO2: 95% 96% 97%   Weight:      Height:        Intake/Output Summary (Last 24 hours) at 02/05/2018 0811 Last data filed at 02/05/2018 0430 Gross per 24 hour  Intake 720 ml  Output 550 ml  Net 170 ml   Filed Weights   02/01/18 2256 02/03/18 0640 02/04/18 0500  Weight: 48.7 kg 47.5 kg 47.4 kg    Telemetry    Sinus tach with PVCs- Personally Reviewed  Physical Exam   GEN: No acute distress.   Neck: No JVD Cardiac: RRR, no murmurs, rubs, or gallops.  Respiratory: Clear to auscultation bilaterally. GI: Soft, nontender, non-distended  MS: No edema Neuro:  Nonfocal  Psych: Normal affect   Labs    Chemistry Recent Labs  Lab 02/03/18 0247 02/04/18 0240 02/05/18 0229  NA 138 139 140  K 4.2 4.5 4.0  CL 103 101 102  CO2 27 28 30  GLUCOSE 123* 112* 113*  BUN 38* 33* 27*  CREATININE 0.86 0.86 0.81    CALCIUM 9.5 9.5 9.0  PROT 5.9*  --   --   ALBUMIN 3.8  --   --   AST 27  --   --   ALT 23  --   --   ALKPHOS 73  --   --   BILITOT 1.0  --   --   GFRNONAA >60 >60 >60  GFRAA >60 >60 >60  ANIONGAP 8 10 8     Hematology Recent Labs  Lab 02/01/18 1538 02/02/18 0624 02/05/18 0229  WBC 9.1 8.5 9.6  RBC 4.88 4.70 4.84  HGB 15.2* 14.5 14.8  HCT 46.5* 44.4 45.8  MCV 95.3 94.5 94.6  MCH 31.1 30.9 30.6  MCHC 32.7 32.7 32.3  RDW 13.2 13.3 13.0  PLT 243 223 213    Cardiac Enzymes Recent Labs  Lab 02/01/18 1937 02/02/18 0030 02/02/18 0624  TROPONINI 1.05* 0.81* 0.97*    Recent Labs  Lab 02/01/18 1553  TROPIPOC 1.55*     BNP Recent Labs  Lab 02/03/18 0247  BNP 689.2*     DDimer  Recent Labs  Lab 02/01/18 1538  DDIMER <0.27     Patient Profile     67 y.o. female with a hx of   50 years tobacco abuse (recently quit), COPD, anxiety and herniated discwho is being seen for the evaluation of elevated troponin (1.55).  Echocardiogram shows ejection fraction 35 to 40% with apical ballooning suggestive of stress-induced cardiomyopathy.  Assessment & Plan    1 elevated troponin-patient presented with COPD exacerbation and troponin elevated.  Echocardiogram shows apical ballooning suggestive of stress-induced cardiomyopathy.  However also with risk factors.  Plan cardiac catheterization today.  The risks and benefits including myocardial infarction, CVA and death discussed and she agrees to proceed.  Continue aspirin and statin.  2 cardiomyopathy- possible stress-induced cardiomyopathy.  We will add low-dose metoprolol and transition to Toprol if she tolerates.  Add ARB later if blood pressure allows.  3 tobacco abuse-patient discontinued 2 weeks ago by her report.  4 hyperlipidemia-continue statin.  Check lipids and liver in 4 weeks.  5 COPD-management per primary care.  For questions or updates, please contact [facility] Please consult [email or web address] for contact  info under        Signed, Keiarra Charon, MD  02/05/2018, 8:11 AM    

## 2018-02-05 NOTE — Progress Notes (Signed)
PROGRESS NOTE  Vickie Bowman ZOX:[id number] DOB: [birth year] DOA: 02/01/2018 PCP: Aida Puffer, MD  HPI/Recap of past 24 hours:  The patient is Vickie Bowman. Vickie Bowman is 67 year old female with long history of tobacco use having quit 3 weeks ago history of COPD she presented to the emergency room with progressive shortness of breath patient said she did not have any chest pain and does not have any chest pain at this time.  When she came to the emergency room her troponin was elevated at 1.55 with sinus tachycardia.  Subjective: Patient seen at bedside she denies any fever nausea and vomiting or chest pain but she complains of cough and she is worried that she might cough during the cardiac catheterization she knows she is supposed to be still for that.  Patient should she will not cough she will be mildly sedated.  I will also give her some Robitussin-DM to help with the coughing now.  She just feels very nervous for which she was given Ativan which has calmed her down she says she is nervous about her upcoming cardiac catheterization she has some questions which were answered appropriately  Assessment/Plan: Principal Problem:   Acute respiratory failure with hypoxia (HCC) Active Problems:   Protein-calorie malnutrition, severe   COPD exacerbation (HCC)   Anxiety   Acute HFrEF (heart failure with reduced ejection fraction) (HCC)   Non-ST elevation (NSTEMI) myocardial infarction (HCC)   Hyperlipidemia LDL goal <70   Underweight due to inadequate caloric intake   Tobacco abuse, in remission   Cardiomyopathy (HCC)   Elevated troponin  1. elevated troponin suspected ACS for cardiac catheterization today continue aspirin and statin 2.  Anxiety.  Particularly concerning upcoming procedure continue Xanax 3.  Congestive heart failure cardiomyopathy EF of 35 to 40%.  BNP is elevated Nexis was discontinued on September 14 by cardiology since patient was euvolemic We will continue telemetry  monitoring daily weight For cardiac catheterization today 4.Tobacco abuse patient has stopped smoking recently  Code Status: Full  Family Communication: None at bedside  Disposition Plan: Home after stabilization   Consultants:  Cardiology  Procedures:  Cardiac catheterization today February 05, 2018  Antimicrobials:  None  DVT prophylaxis: Lovenox   Objective: Vitals:   02/05/18 1900 02/05/18 2004  BP: 124/84   Pulse: 78 80  Resp: (!) 26 19  Temp: 97.8 F (36.6 C)   SpO2: 96% 96%    Intake/Output Summary (Last 24 hours) at 02/05/2018 2150 Last data filed at 02/05/2018 1600 Gross per 24 hour  Intake 555 ml  Output 750 ml  Net -195 ml   Filed Weights   02/01/18 2256 02/03/18 0640 02/04/18 0500  Weight: 48.7 kg 47.5 kg 47.4 kg   Body mass index is 16.37 kg/m.  Exam:   General: Alert and oriented x3 pleasant  Cardiovascular: Heart rate regular rate and rhythm no murmur  Respiratory: Respiration is unlabored clear to auscultation bilaterally  Abdomen: Soft nontender normal active bowel sounds no hepatosplenomegaly  Musculoskeletal: Moves all 4 extremities  Skin: Warm dry no rashes  Psychiatry: Slightly nervous but pleasant and appropriate   Data Reviewed: CBC: Recent Labs  Lab 02/01/18 1538 02/02/18 0624 02/05/18 0229  WBC 9.1 8.5 9.6  HGB 15.2* 14.5 14.8  HCT 46.5* 44.4 45.8  MCV 95.3 94.5 94.6  PLT 243 223 213   Basic Metabolic Panel: Recent Labs  Lab 02/01/18 1538 02/02/18 0624 02/03/18 0247 02/04/18 0240 02/05/18 0229  NA 140 142 138 139 140  K 4.5 4.6 4.2 4.5 4.0  CL 104 108 103 101 102  CO2 23 25 27 28 30   GLUCOSE 124* 120* 123* 112* 113*  BUN 14 19 38* 33* 27*  CREATININE 0.94 0.85 0.86 0.86 0.81  CALCIUM 9.4 9.4 9.5 9.5 9.0  MG  --   --   --   --  2.5*  PHOS  --   --   --   --  4.1   GFR: Estimated Creatinine Clearance: 49 mL/min (by C-G formula based on SCr of 0.81 mg/dL). Liver Function Tests: Recent Labs    Lab 02/03/18 0247  AST 27  ALT 23  ALKPHOS 73  BILITOT 1.0  PROT 5.9*  ALBUMIN 3.8   No results for input(s): LIPASE, AMYLASE in the last 168 hours. No results for input(s): AMMONIA in the last 168 hours. Coagulation Profile: No results for input(s): INR, PROTIME in the last 168 hours. Cardiac Enzymes: Recent Labs  Lab 02/01/18 1937 02/02/18 0030 02/02/18 0624  TROPONINI 1.05* 0.81* 0.97*   BNP (last 3 results) No results for input(s): PROBNP in the last 8760 hours. HbA1C: No results for input(s): HGBA1C in the last 72 hours. CBG: No results for input(s): GLUCAP in the last 168 hours. Lipid Profile: No results for input(s): CHOL, HDL, LDLCALC, TRIG, CHOLHDL, LDLDIRECT in the last 72 hours. Thyroid Function Tests: No results for input(s): TSH, T4TOTAL, FREET4, T3FREE, THYROIDAB in the last 72 hours. Anemia Panel: No results for input(s): VITAMINB12, FOLATE, FERRITIN, TIBC, IRON, RETICCTPCT in the last 72 hours. Urine analysis: No results found for: COLORURINE, APPEARANCEUR, LABSPEC, PHURINE, GLUCOSEU, HGBUR, BILIRUBINUR, KETONESUR, PROTEINUR, UROBILINOGEN, NITRITE, LEUKOCYTESUR Sepsis Labs: @LABRCNTIP (procalcitonin:4,lacticidven:4)  ) Recent Results (from the past 240 hour(s))  MRSA PCR Screening     Status: None   Collection Time: 02/01/18 11:51 PM  Result Value Ref Range Status   MRSA by PCR NEGATIVE NEGATIVE Final    Comment:        The GeneXpert MRSA Assay (FDA approved for NASAL specimens only), is one component of a comprehensive MRSA colonization surveillance program. It is not intended to diagnose MRSA infection nor to guide or monitor treatment for MRSA infections. Performed at [facility], [address] [place], [place] [address]       Studies: No results found.  Scheduled Meds: . aspirin EC  81 mg Oral Daily  . atorvastatin  40 mg Oral q1800  . [START ON 02/06/2018] enoxaparin (LOVENOX) injection  40 mg Subcutaneous Q24H  . feeding  supplement (ENSURE ENLIVE)  237 mL Oral BID BM  . ipratropium-albuterol  3 mL Nebulization TID  . metoprolol tartrate  12.5 mg Oral BID  . multivitamin with minerals  1 tablet Oral Daily  . senna-docusate  1 tablet Oral Daily  . sodium chloride flush  3 mL Intravenous Q12H    Continuous Infusions: . sodium chloride       LOS: 2 days     Myrtie NeitherNwannadiya Woodley Petzold, MD [facility]  To reach me or the doctor on call, go to: [email or web address] Password [email or web address]  02/05/2018, 9:50 PM

## 2018-02-05 NOTE — H&P (View-Only) (Signed)
Progress Note  Patient Name: Vickie Bowman Date of Encounter: 02/05/2018  Primary Cardiologist: Chrystie Nose, MD   Subjective   No chest pain or dyspnea  Inpatient Medications    Scheduled Meds: . aspirin EC  81 mg Oral Daily  . atorvastatin  40 mg Oral q1800  . enoxaparin (LOVENOX) injection  40 mg Subcutaneous Daily  . feeding supplement (ENSURE ENLIVE)  237 mL Oral BID BM  . ipratropium-albuterol  3 mL Nebulization TID  . multivitamin with minerals  1 tablet Oral Daily  . senna-docusate  1 tablet Oral Daily  . sodium chloride flush  3 mL Intravenous Q12H   Continuous Infusions: . sodium chloride    . sodium chloride 50 mL/hr at 02/05/18 0628   PRN Meds: sodium chloride, acetaminophen **OR** acetaminophen, ALPRAZolam, bisacodyl, docusate sodium, levalbuterol, menthol-cetylpyridinium, sodium chloride flush   Vital Signs    Vitals:   02/04/18 1527 02/04/18 1543 02/04/18 2009 02/05/18 0434  BP: 122/80  131/86   Pulse: 100  (!) 118   Resp: (!) 31  (!) 32   Temp: 98 F (36.7 C)  97.8 F (36.6 C) 97.9 F (36.6 C)  TempSrc: Oral  Oral Oral  SpO2: 95% 96% 97%   Weight:      Height:        Intake/Output Summary (Last 24 hours) at 02/05/2018 0811 Last data filed at 02/05/2018 0430 Gross per 24 hour  Intake 720 ml  Output 550 ml  Net 170 ml   Filed Weights   02/01/18 2256 02/03/18 0640 02/04/18 0500  Weight: 48.7 kg 47.5 kg 47.4 kg    Telemetry    Sinus tach with PVCs- Personally Reviewed  Physical Exam   GEN: No acute distress.   Neck: No JVD Cardiac: RRR, no murmurs, rubs, or gallops.  Respiratory: Clear to auscultation bilaterally. GI: Soft, nontender, non-distended  MS: No edema Neuro:  Nonfocal  Psych: Normal affect   Labs    Chemistry Recent Labs  Lab 02/03/18 0247 02/04/18 0240 02/05/18 0229  NA 138 139 140  K 4.2 4.5 4.0  CL 103 101 102  CO2 27 28 30   GLUCOSE 123* 112* 113*  BUN 38* 33* 27*  CREATININE 0.86 0.86 0.81    CALCIUM 9.5 9.5 9.0  PROT 5.9*  --   --   ALBUMIN 3.8  --   --   AST 27  --   --   ALT 23  --   --   ALKPHOS 73  --   --   BILITOT 1.0  --   --   GFRNONAA >60 >60 >60  GFRAA >60 >60 >60  ANIONGAP 8 10 8      Hematology Recent Labs  Lab 02/01/18 1538 02/02/18 0624 02/05/18 0229  WBC 9.1 8.5 9.6  RBC 4.88 4.70 4.84  HGB 15.2* 14.5 14.8  HCT 46.5* 44.4 45.8  MCV 95.3 94.5 94.6  MCH 31.1 30.9 30.6  MCHC 32.7 32.7 32.3  RDW 13.2 13.3 13.0  PLT 243 223 213    Cardiac Enzymes Recent Labs  Lab 02/01/18 1937 02/02/18 0030 02/02/18 0624  TROPONINI 1.05* 0.81* 0.97*    Recent Labs  Lab 02/01/18 1553  TROPIPOC 1.55*     BNP Recent Labs  Lab 02/03/18 0247  BNP 689.2*     DDimer  Recent Labs  Lab 02/01/18 1538  DDIMER <0.27     Patient Profile     67 y.o. female with a hx of  50 years tobacco abuse (recently quit), COPD, anxiety and herniated discwho is being seen for the evaluation of elevated troponin (1.55).  Echocardiogram shows ejection fraction 35 to 40% with apical ballooning suggestive of stress-induced cardiomyopathy.  Assessment & Plan    1 elevated troponin-patient presented with COPD exacerbation and troponin elevated.  Echocardiogram shows apical ballooning suggestive of stress-induced cardiomyopathy.  However also with risk factors.  Plan cardiac catheterization today.  The risks and benefits including myocardial infarction, CVA and death discussed and she agrees to proceed.  Continue aspirin and statin.  2 cardiomyopathy- possible stress-induced cardiomyopathy.  We will add low-dose metoprolol and transition to Toprol if she tolerates.  Add ARB later if blood pressure allows.  3 tobacco abuse-patient discontinued 2 weeks ago by her report.  4 hyperlipidemia-continue statin.  Check lipids and liver in 4 weeks.  5 COPD-management per primary care.  For questions or updates, please contact [facility] Please consult [email or web address] for contact  info under        Signed, Olga MillersBrian Megahn Killings, MD  02/05/2018, 8:11 AM

## 2018-02-05 NOTE — Interval H&P Note (Signed)
History and Physical Interval Note:  02/05/2018 11:40 AM  Vickie Bowman  has presented today for cardiac catheterization, with the diagnosis of shortness of breath, cardiomyopathy, elevated troponin.  The various methods of treatment have been discussed with the patient and family. After consideration of risks, benefits and other options for treatment, the patient has consented to  Procedure(s): LEFT HEART CATH AND CORONARY ANGIOGRAPHY (N/A) as a surgical intervention .  The patient's history has been reviewed, patient examined, no change in status, stable for surgery.  I have reviewed the patient's chart and labs.  Questions were answered to the patient's satisfaction.    Cath Lab Visit (complete for each Cath Lab visit)  Clinical Evaluation Leading to the Procedure:   ACS: Yes.    Non-ACS:  N/A  [name]

## 2018-02-05 NOTE — Brief Op Note (Signed)
BRIEF CARDIAC CATHETERIZATION NOTE  DATE: 02/05/2018 TIME: 12:19 PM  PATIENT:  Vickie Bowman  67 y.o. female  PRE-OPERATIVE DIAGNOSIS:  cp  POST-OPERATIVE DIAGNOSIS:  * No post-op diagnosis entered *  PROCEDURE:  Procedure(s): LEFT HEART CATH AND CORONARY ANGIOGRAPHY (N/A)  SURGEON:  Surgeon(s) and Role:    * Yavier Snider, MD - Primary  FINDINGS: 1. Mild non-obstructive CAD. 2. Low normal LVEF with subtle apical hypokinesis. 3. Normal LVEDP.  RECOMMENDATIONS: 1. Medical therapy.  Yvonne Kendallhristopher Sephira Zellman, MD [facility] Pager: [phone]

## 2018-02-06 DIAGNOSIS — R748 Abnormal levels of other serum enzymes: Secondary | ICD-10-CM

## 2018-02-06 DIAGNOSIS — I5181 Takotsubo syndrome: Secondary | ICD-10-CM

## 2018-02-06 LAB — BASIC METABOLIC PANEL
Anion gap: 6 (ref 5–15)
BUN: 20 mg/dL (ref 8–23)
CALCIUM: 8.7 mg/dL — AB (ref 8.9–10.3)
CO2: 27 mmol/L (ref 22–32)
CREATININE: 0.79 mg/dL (ref 0.44–1.00)
Chloride: 108 mmol/L (ref 98–111)
GFR calc non Af Amer: >60 mL/min (ref >60)
Glucose, Bld: 98 mg/dL (ref 70–99)
Potassium: 3.9 mmol/L (ref 3.5–5.1)
SODIUM: 141 mmol/L (ref 135–145)

## 2018-02-06 MED ORDER — ASPIRIN 81 MG PO TBEC: 81.0000 mg | DELAYED_RELEASE_TABLET | Freq: Every day | ORAL | 0 refills | Status: AC

## 2018-02-06 MED ORDER — IPRATROPIUM-ALBUTEROL 0.5-2.5 (3) MG/3ML IN SOLN: 3.0000 mL | Freq: Four times a day (QID) | RESPIRATORY_TRACT | 0 refills | Status: DC | PRN

## 2018-02-06 MED ORDER — PROAIR HFA 108 (90 BASE) MCG/ACT IN AERS: 2.0000 | INHALATION_SPRAY | Freq: Four times a day (QID) | RESPIRATORY_TRACT | 0 refills | Status: DC | PRN

## 2018-02-06 MED ORDER — ENSURE ENLIVE PO LIQD: 237.0000 mL | Freq: Two times a day (BID) | ORAL | 0 refills | Status: DC

## 2018-02-06 MED ORDER — METOPROLOL SUCCINATE ER 25 MG PO TB24: 12.5000 mg | ORAL_TABLET | Freq: Every day | ORAL | 0 refills | Status: DC

## 2018-02-06 MED ORDER — METOPROLOL SUCCINATE ER 25 MG PO TB24
12.5000 mg | ORAL_TABLET | Freq: Every day | ORAL | Status: DC
  Administered 2018-02-06: 12.5 mg via ORAL
  Filled 2018-02-06: qty 1

## 2018-02-06 MED ORDER — SPIRIVA RESPIMAT 1.25 MCG/ACT IN AERS: 1.0000 | INHALATION_SPRAY | Freq: Every day | RESPIRATORY_TRACT | 0 refills | Status: DC

## 2018-02-06 MED ORDER — ATORVASTATIN CALCIUM 40 MG PO TABS: 40.0000 mg | ORAL_TABLET | Freq: Every day | ORAL | 0 refills | Status: DC

## 2018-02-06 NOTE — Care Management Important Message (Signed)
Important Message  Patient Details  Name: Vickie GillesSylvia J Caffee MRN: [id number] Date of Birth: 07/02/1950   Medicare Important Message Given:  Yes    Ashlin Kreps P Aria Pickrell 02/06/2018, 2:39 PM

## 2018-02-06 NOTE — Progress Notes (Signed)
Progress Note  Patient Name: Vickie Bowman Date of Encounter: 02/06/2018  Primary Cardiologist: Chrystie NoseKenneth C Hilty, MD   Subjective   Denies CP or dyspnea  Inpatient Medications    Scheduled Meds: . aspirin EC  81 mg Oral Daily  . atorvastatin  40 mg Oral q1800  . enoxaparin (LOVENOX) injection  40 mg Subcutaneous Q24H  . feeding supplement (ENSURE ENLIVE)  237 mL Oral BID BM  . ipratropium-albuterol  3 mL Nebulization BID  . metoprolol tartrate  12.5 mg Oral BID  . multivitamin with minerals  1 tablet Oral Daily  . senna-docusate  1 tablet Oral Daily  . sodium chloride flush  3 mL Intravenous Q12H   Continuous Infusions: . sodium chloride     PRN Meds: sodium chloride, acetaminophen **OR** acetaminophen, ALPRAZolam, bisacodyl, docusate sodium, guaiFENesin-dextromethorphan, levalbuterol, menthol-cetylpyridinium, sodium chloride flush   Vital Signs    Vitals:   02/06/18 0500 02/06/18 0600 02/06/18 0727 02/06/18 0735  BP: 108/72 121/75  107/76  Pulse: 74 72 73 93  Resp: 18 19 (!) 21 (!) 25  Temp:    98 F (36.7 C)  TempSrc:    Oral  SpO2: 95% 99% 98% 97%  Weight:      Height:        Intake/Output Summary (Last 24 hours) at 02/06/2018 0844 Last data filed at 02/05/2018 2100 Gross per 24 hour  Intake 555 ml  Output 500 ml  Net 55 ml   Filed Weights   02/01/18 2256 02/03/18 0640 02/04/18 0500  Weight: 48.7 kg 47.5 kg 47.4 kg    Telemetry    Sinus- Personally Reviewed  Physical Exam   GEN: WD frail No acute distress.   Neck: No JVD, supple Cardiac: RRR Respiratory: CTA GI: Soft, NT/ND MS: No edema Neuro:  Grossly intact  Labs    Chemistry Recent Labs  Lab 02/03/18 0247 02/04/18 0240 02/05/18 0229 02/06/18 0221  NA 138 139 140 141  K 4.2 4.5 4.0 3.9  CL 103 101 102 108  CO2 27 28 30 27   GLUCOSE 123* 112* 113* 98  BUN 38* 33* 27* 20  CREATININE 0.86 0.86 0.81 0.79  CALCIUM 9.5 9.5 9.0 8.7*  PROT 5.9*  --   --   --   ALBUMIN 3.8  --   --    --   AST 27  --   --   --   ALT 23  --   --   --   ALKPHOS 73  --   --   --   BILITOT 1.0  --   --   --   GFRNONAA >60 >60 >60 >60  GFRAA >60 >60 >60 >60  ANIONGAP 8 10 8 6      Hematology Recent Labs  Lab 02/01/18 1538 02/02/18 0624 02/05/18 0229  WBC 9.1 8.5 9.6  RBC 4.88 4.70 4.84  HGB 15.2* 14.5 14.8  HCT 46.5* 44.4 45.8  MCV 95.3 94.5 94.6  MCH 31.1 30.9 30.6  MCHC 32.7 32.7 32.3  RDW 13.2 13.3 13.0  PLT 243 223 213    Cardiac Enzymes Recent Labs  Lab 02/01/18 1937 02/02/18 0030 02/02/18 0624  TROPONINI 1.05* 0.81* 0.97*    Recent Labs  Lab 02/01/18 1553  TROPIPOC 1.55*     BNP Recent Labs  Lab 02/03/18 0247  BNP 689.2*     DDimer  Recent Labs  Lab 02/01/18 1538  DDIMER <0.27     Patient Profile  67 y.o. female with a hx of 50 years tobacco abuse (recently quit), COPD, anxiety and herniated discwho is being seen for the evaluation of elevated troponin (1.55).  Echocardiogram shows ejection fraction 35 to 40% with apical ballooning suggestive of stress-induced cardiomyopathy.  Cardiac catheterization reveals mild nonobstructive coronary disease, ejection fraction 50 to 55% with mild apical hypokinesis.   Assessment & Plan    1 elevated troponin-patient presented with COPD exacerbation and troponin elevated.  Echocardiogram shows apical ballooning suggestive of stress-induced cardiomyopathy.  Cardiac catheterization revealed nonobstructive coronary disease, ejection fraction 50 to 55% and mild apical hypokinesis.  This is consistent with stress-induced cardiomyopathy.  Continue aspirin and statin.    2 cardiomyopathy-patient presented with stress-induced cardiomyopathy.  Appears to be improving based on catheterization results.  Blood pressure was borderline yesterday.  Decrease Toprol to 12.5 mg daily.  She will need repeat echocardiogram in approximately 3 months.    3 tobacco abuse-I have encouraged patient to avoid tobacco.    4  hyperlipidemia-continue statin.  Check lipids and liver in 4 weeks.  5 COPD-management per primary care.  [facility] will sign off.   Medication Recommendations: Continue aspirin, Lipitor and Toprol 12.5 mg daily at discharge.  Other medications per primary care. Other recommendations (labs, testing, etc): No further cardiac testing at this time. Follow up as an outpatient: Follow-up Dr. Rennis Golden 4 to 6 weeks.  For questions or updates, please contact [facility] Please consult [email or web address] for contact info under        Signed, Olga Millers, MD  02/06/2018, 8:44 AM

## 2018-02-06 NOTE — Care Management Note (Signed)
Case Management Note  Patient Details  Name: Vickie GillesSylvia J Oltmann MRN: [id number] Date of Birth: 23-Apr-1951  Subjective/Objective:   Pt admitted with CP                 Action/Plan:  PTA independent from home.   Pt has PCP and denied barriers with obtaining/paying meds as prescribed   Expected Discharge Date:  02/06/18               Expected Discharge Plan:  Home w Home Health Services(Independent from home)  In-House Referral:     Discharge planning Services  CM Consult  Post Acute Care Choice:    Choice offered to:  Patient  DME Arranged:  Nebulizer/meds DME Agency:  [facility]  [facility]     Status of Service:  In process, will continue to follow  If discussed at Long Length of Stay Meetings, dates discussed:    Additional Comments: 02/06/2018 Pt to discharge home today.  [facility] - equipment will be delivered shortly.  [name], [name], RN 02/06/2018, 11:45 AM

## 2018-02-06 NOTE — Discharge Summary (Addendum)
PATIENT DETAILS Name: Vickie Bowman Age: 67 y.o. Sex: female Date of Birth: Dec 02, 1950 MRN: [id number]. Admitting Physician: Kathlen Mody, MD WUJ:[id number], Fayrene Fearing, MD  Admit Date: 02/01/2018 Discharge date: 02/06/2018  Recommendations for Outpatient Follow-up:  1. Follow up with PCP in 1-2 weeks 2. Please obtain BMP/CBC in one week 3. Please ensure follow-up with cardiology.    Admitted From:  Home  Disposition: Home   Home Health: No  Equipment/Devices: None  Discharge Condition: Stable  CODE STATUS: FULL CODE  Diet recommendation:  Heart Healthy   Brief Summary: See H&P, Labs, Consult and Test reports for all details in brief, patient is a 67 year old female with long-standing history of COPD, quit tobacco use approximately 3 weeks prior to this hospital stay-presented with shortness of breath-thought to be a combination of newly diagnosed Takotsubo cardiomyopathy and COPD exacerbation.  See below for further details.  Brief Hospital Course: Acute hypoxic respiratory failure: Secondary to COPD exacerbation and newly diagnosed HFrEF (Takotsubo cardiomyopathy).  Managed with Lasix, steroids, bronchodilators, respiratory failure has resolved-she is on room air.  Nursing staff ambulated patient on 9/17-her O2 saturations were in the 90s.   Takotsubo cardiomyopathy: Volume status is stable-LHC on 9/16 showed nonobstructive disease.  No longer on diuretics as she remains euvolemic.  Continue Toprol on discharge.  Follow-up with cardiology in the outpatient setting.  COPD exacerbation: Rapidly improved with fluids and bronchodilator treatment stable for close follow-up in the outpatient setting.  Nonobstructive CAD: Continue aspirin and statin.  Tobacco abuse: Quit 3 weeks prior to this hospital stay-she was counseled to continue abstinence from tobacco use.  Severe protein calorie malnutrition: Continue supplementation.  Procedures/Studies: 9/16>> LHC: 1. Mild  non-obstructive CAD. 2. Low normal LVEF with subtle apical hypokinesis. 3. Normal LVEDP.  9/30>> TTE: - Left ventricle: The cavity size was normal. Systolic function was   moderately reduced. The estimated ejection fraction was in the   range of 35% to 40%. Hyperdynamic basal segment with dyskinetic   mid-apical segments in all walls. Doppler parameters are   consistent with abnormal left ventricular relaxation (grade 1   diastolic dysfunction). - Aortic valve: Valve area (VTI): 2.68 cm^2. Valve area (Vmax): 3.3   cm^2. Valve area (Vmean): 2.72 cm^2. - Tricuspid valve: There was moderate regurgitation directed   centrally. - Pulmonary arteries: Systolic pressure was mildly increased. PA   peak pressure: 47 mm Hg (S   Discharge Diagnoses:  Principal Problem:   Acute respiratory failure with hypoxia (HCC) Active Problems:   Protein-calorie malnutrition, severe   COPD exacerbation (HCC)   Anxiety   Acute HFrEF (heart failure with reduced ejection fraction) (HCC)   Non-ST elevation (NSTEMI) myocardial infarction (HCC)   Hyperlipidemia LDL goal <70   Underweight due to inadequate caloric intake   Tobacco abuse, in remission   Cardiomyopathy (HCC)   Elevated troponin   Discharge Instructions:  Activity:  As tolerated    Discharge Instructions    Diet - low sodium heart healthy   Complete by:  As directed    Discharge instructions   Complete by:  As directed    Follow with Primary MD  Aida Puffer, MD in 1 week  Follow with cardiology as instructed  Please get a complete blood count and chemistry panel checked by your Primary MD at your next visit, and again as instructed by your Primary MD.  Get Medicines reviewed and adjusted: Please take all your medications with you for your next visit with your Primary MD  Laboratory/radiological data: Please request your Primary MD to go over all hospital tests and procedure/radiological results at the follow up, please ask  your Primary MD to get all Hospital records sent to his/her office.  In some cases, they will be blood work, cultures and biopsy results pending at the time of your discharge. Please request that your primary care M.D. follows up on these results.  Also Note the following: If you experience worsening of your admission symptoms, develop shortness of breath, life threatening emergency, suicidal or homicidal thoughts you must seek medical attention immediately by calling 911 or calling your MD immediately  if symptoms less severe.  You must read complete instructions/literature along with all the possible adverse reactions/side effects for all the Medicines you take and that have been prescribed to you. Take any new Medicines after you have completely understood and accpet all the possible adverse reactions/side effects.   Do not drive when taking Pain medications or sleeping medications (Benzodaizepines)  Do not take more than prescribed Pain, Sleep and Anxiety Medications. It is not advisable to combine anxiety,sleep and pain medications without talking with your primary care practitioner  Special Instructions: If you have smoked or chewed Tobacco  in the last 2 yrs please stop smoking, stop any regular Alcohol  and or any Recreational drug use.  Wear Seat belts while driving.  Please note: You were cared for by a hospitalist during your hospital stay. Once you are discharged, your primary care physician will handle any further medical issues. Please note that NO REFILLS for any discharge medications will be authorized once you are discharged, as it is imperative that you return to your primary care physician (or establish a relationship with a primary care physician if you do not have one) for your post hospital discharge needs so that they can reassess your need for medications and monitor your lab values.   Increase activity slowly   Complete by:  As directed      Allergies as of 02/06/2018        Reactions   Penicillins Swelling   Has patient had a PCN reaction causing immediate rash, facial/tongue/throat swelling, SOB or lightheadedness with hypotension: Yes Has patient had a PCN reaction causing severe rash involving mucus membranes or skin necrosis: No Has patient had a PCN reaction that required hospitalization: No Has patient had a PCN reaction occurring within the last 10 years: No If all of the above answers are "NO", then may proceed with Cephalosporin use.      Medication List    STOP taking these medications   meloxicam 15 MG tablet Commonly known as:  MOBIC     TAKE these medications   aspirin 81 MG EC tablet Take 1 tablet (81 mg total) by mouth daily.   atorvastatin 40 MG tablet Commonly known as:  LIPITOR Take 1 tablet (40 mg total) by mouth daily at 6 PM.   feeding supplement (ENSURE ENLIVE) Liqd Take 237 mLs by mouth 2 (two) times daily between meals.   HYDROcodone-acetaminophen 5-325 MG tablet Commonly known as:  NORCO/VICODIN Take 1 tablet by mouth 2 (two) times daily as needed for pain.   ipratropium-albuterol 0.5-2.5 (3) MG/3ML Soln Commonly known as:  DUONEB Take 3 mLs by nebulization every 6 (six) hours as needed.   metoprolol succinate 25 MG 24 hr tablet Commonly known as:  TOPROL-XL Take 0.5 tablets (12.5 mg total) by mouth daily.   PROAIR HFA 108 (90 Base) MCG/ACT inhaler Generic drug:  albuterol  Inhale 2 puffs into the lungs 4 (four) times daily as needed for wheezing.   SPIRIVA RESPIMAT 1.25 MCG/ACT Aers Generic drug:  Tiotropium Bromide Monohydrate Inhale 1 puff into the lungs daily.            Durable Medical Equipment  (From admission, onward)         Start     Ordered   02/02/18 1318  For home use only DME Nebulizer/meds  Once    Question:  Patient needs a nebulizer to treat with the following condition  Answer:  COPD (chronic obstructive pulmonary disease) (HCC)   02/02/18 1317         Follow-up Information     [facility] Follow up.   Contact information: Please call [phone] and request asssitance locating a primary care doctor within your insurance network.  You can also contact your insurance carrier directly and request assitance.       Little, James, MD. Schedule an appointment as soon as possible for a visit in 1 week(s).   Specialty:  Family Medicine Contact information: [address] [place] [address] [phone]        Chrystie Nose, MD .   Specialty:  Cardiology Contact information: [address] [place] [address] [phone]          Allergies  Allergen Reactions  . Penicillins Swelling    Has patient had a PCN reaction causing immediate rash, facial/tongue/throat swelling, SOB or lightheadedness with hypotension: Yes Has patient had a PCN reaction causing severe rash involving mucus membranes or skin necrosis: No Has patient had a PCN reaction that required hospitalization: No Has patient had a PCN reaction occurring within the last 10 years: No If all of the above answers are "NO", then may proceed with Cephalosporin use.    Consultations:   cardiology  Other Procedures/Studies: Dg Chest 2 View  Result Date: 02/01/2018 CLINICAL DATA:  Short of breath for several days, worse recently, post breathing treatment EXAM: CHEST - 2 VIEW COMPARISON:  None. FINDINGS: The lungs are markedly hyperaerated with increased AP diameter and flattened hemidiaphragms consistent with emphysema. No parenchymal infiltrate or pleural effusion is seen and no lung nodule is evident. Mediastinal and hilar contours are unremarkable. The heart is within normal limits in size. No bony abnormality is seen. IMPRESSION: Hyperaeration most consistent with emphysema. No active lung disease. Electronically Signed   By: Dwyane Dee M.D.   On: 02/01/2018 17:12     TODAY-DAY OF DISCHARGE:  Subjective:   Vickie Bowman today has no headache,no chest abdominal  pain,no new weakness tingling or numbness, feels much better wants to go home today.   Objective:   Blood pressure 99/88, pulse 75, temperature 97.9 F (36.6 C), temperature source Oral, resp. rate 14, height 5\' 7"  (1.702 m), weight 47.4 kg, SpO2 98 %.  Intake/Output Summary (Last 24 hours) at 02/06/2018 1212 Last data filed at 02/06/2018 1100 Gross per 24 hour  Intake 545 ml  Output 800 ml  Net -255 ml   Filed Weights   02/01/18 2256 02/03/18 0640 02/04/18 0500  Weight: 48.7 kg 47.5 kg 47.4 kg    Exam: Awake Alert, Oriented *3, No new F.N deficits, Normal affect [place].AT,PERRAL Supple Neck,No JVD, No cervical lymphadenopathy appriciated.  Symmetrical Chest wall movement, Good air movement bilaterally, CTAB RRR,No Gallops,Rubs or new Murmurs, No Parasternal Heave +ve B.Sounds, Abd Soft, Non tender, No organomegaly appriciated, No rebound -guarding or rigidity. No Cyanosis, Clubbing  or edema, No new Rash or bruise   PERTINENT RADIOLOGIC STUDIES: Dg Chest 2 View  Result Date: 02/01/2018 CLINICAL DATA:  Short of breath for several days, worse recently, post breathing treatment EXAM: CHEST - 2 VIEW COMPARISON:  None. FINDINGS: The lungs are markedly hyperaerated with increased AP diameter and flattened hemidiaphragms consistent with emphysema. No parenchymal infiltrate or pleural effusion is seen and no lung nodule is evident. Mediastinal and hilar contours are unremarkable. The heart is within normal limits in size. No bony abnormality is seen. IMPRESSION: Hyperaeration most consistent with emphysema. No active lung disease. Electronically Signed   By: Dwyane DeePaul  Barry M.D.   On: 02/01/2018 17:12     PERTINENT LAB RESULTS: CBC: Recent Labs    02/05/18 0229  WBC 9.6  HGB 14.8  HCT 45.8  PLT 213   CMET CMP     Component Value Date/Time   NA 141 02/06/2018 0221   K 3.9 02/06/2018 0221   CL 108 02/06/2018 0221   CO2 27 02/06/2018 0221   GLUCOSE 98 02/06/2018 0221   BUN 20  02/06/2018 0221   CREATININE 0.79 02/06/2018 0221   CALCIUM 8.7 (L) 02/06/2018 0221   PROT 5.9 (L) 02/03/2018 0247   ALBUMIN 3.8 02/03/2018 0247   AST 27 02/03/2018 0247   ALT 23 02/03/2018 0247   ALKPHOS 73 02/03/2018 0247   BILITOT 1.0 02/03/2018 0247   GFRNONAA >60 02/06/2018 0221   GFRAA >60 02/06/2018 0221    GFR Estimated Creatinine Clearance: 49.7 mL/min (by C-G formula based on SCr of 0.79 mg/dL). No results for input(s): LIPASE, AMYLASE in the last 72 hours. No results for input(s): CKTOTAL, CKMB, CKMBINDEX, TROPONINI in the last 72 hours. Invalid input(s): POCBNP No results for input(s): DDIMER in the last 72 hours. No results for input(s): HGBA1C in the last 72 hours. No results for input(s): CHOL, HDL, LDLCALC, TRIG, CHOLHDL, LDLDIRECT in the last 72 hours. No results for input(s): TSH, T4TOTAL, T3FREE, THYROIDAB in the last 72 hours.  Invalid input(s): FREET3 No results for input(s): VITAMINB12, FOLATE, FERRITIN, TIBC, IRON, RETICCTPCT in the last 72 hours. Coags: No results for input(s): INR in the last 72 hours.  Invalid input(s): PT Microbiology: Recent Results (from the past 240 hour(s))  MRSA PCR Screening     Status: None   Collection Time: 02/01/18 11:51 PM  Result Value Ref Range Status   MRSA by PCR NEGATIVE NEGATIVE Final    Comment:        The GeneXpert MRSA Assay (FDA approved for NASAL specimens only), is one component of a comprehensive MRSA colonization surveillance program. It is not intended to diagnose MRSA infection nor to guide or monitor treatment for MRSA infections. Performed at [facility], [address] [place], [place] [id number]     FURTHER DISCHARGE INSTRUCTIONS:  Get Medicines reviewed and adjusted: Please take all your medications with you for your next visit with your Primary MD  Laboratory/radiological data: Please request your Primary MD to go over all hospital tests and procedure/radiological results at the  follow up, please ask your Primary MD to get all Hospital records sent to his/her office.  In some cases, they will be blood work, cultures and biopsy results pending at the time of your discharge. Please request that your primary care M.D. goes through all the records of your hospital data and follows up on these results.  Also Note the following: If you experience worsening of your admission symptoms, develop shortness  of breath, life threatening emergency, suicidal or homicidal thoughts you must seek medical attention immediately by calling 911 or calling your MD immediately  if symptoms less severe.  You must read complete instructions/literature along with all the possible adverse reactions/side effects for all the Medicines you take and that have been prescribed to you. Take any new Medicines after you have completely understood and accpet all the possible adverse reactions/side effects.   Do not drive when taking Pain medications or sleeping medications (Benzodaizepines)  Do not take more than prescribed Pain, Sleep and Anxiety Medications. It is not advisable to combine anxiety,sleep and pain medications without talking with your primary care practitioner  Special Instructions: If you have smoked or chewed Tobacco  in the last 2 yrs please stop smoking, stop any regular Alcohol  and or any Recreational drug use.  Wear Seat belts while driving.  Please note: You were cared for by a hospitalist during your hospital stay. Once you are discharged, your primary care physician will handle any further medical issues. Please note that NO REFILLS for any discharge medications will be authorized once you are discharged, as it is imperative that you return to your primary care physician (or establish a relationship with a primary care physician if you do not have one) for your post hospital discharge needs so that they can reassess your need for medications and monitor your lab values.  Total Time  spent coordinating discharge including counseling, education and face to face time equals 45 minutes.  Signed: Shanker Ghimire 02/06/2018 12:12 PM

## 2018-03-02 ENCOUNTER — Encounter: Payer: Self-pay | Admitting: Physician Assistant

## 2018-03-02 ENCOUNTER — Ambulatory Visit (INDEPENDENT_AMBULATORY_CARE_PROVIDER_SITE_OTHER): Payer: Medicare Other | Admitting: Physician Assistant

## 2018-03-02 ENCOUNTER — Telehealth: Payer: Self-pay | Admitting: Physician Assistant

## 2018-03-02 VITALS — BP 129/86 | HR 91 | Ht 67.0 in | Wt 108.8 lb

## 2018-03-02 DIAGNOSIS — I5181 Takotsubo syndrome: Secondary | ICD-10-CM

## 2018-03-02 DIAGNOSIS — F419 Anxiety disorder, unspecified: Secondary | ICD-10-CM

## 2018-03-02 DIAGNOSIS — J449 Chronic obstructive pulmonary disease, unspecified: Secondary | ICD-10-CM | POA: Diagnosis not present

## 2018-03-02 DIAGNOSIS — E785 Hyperlipidemia, unspecified: Secondary | ICD-10-CM

## 2018-03-02 DIAGNOSIS — F17201 Nicotine dependence, unspecified, in remission: Secondary | ICD-10-CM

## 2018-03-02 DIAGNOSIS — J9612 Chronic respiratory failure with hypercapnia: Secondary | ICD-10-CM

## 2018-03-02 DIAGNOSIS — I2510 Atherosclerotic heart disease of native coronary artery without angina pectoris: Secondary | ICD-10-CM

## 2018-03-02 DIAGNOSIS — J9611 Chronic respiratory failure with hypoxia: Secondary | ICD-10-CM | POA: Diagnosis not present

## 2018-03-02 NOTE — Telephone Encounter (Signed)
I will fax over a copy of Vickie Bowman's office note to [facility]. I will send a staff message to see if I need to do anything else.

## 2018-03-02 NOTE — Patient Instructions (Addendum)
Medication Instructions:  Your physician recommends that you continue on your current medications as directed. Please refer to the Current Medication list given to you today. If you need a refill on your cardiac medications before your next appointment, please call your pharmacy.   Lab work: Your physician recommends that you return for lab work in: 6-8weeks FASTING LIPID, LFT If you have labs (blood work) drawn today and your tests are completely normal, you will receive your results only by: Marland Kitchen MyChart Message (if you have MyChart) OR . A paper copy in the mail If you have any lab test that is abnormal or we need to change your treatment, we will call you to review the results.  Testing/Procedures: Your physician has requested that you have an LIMITED Echocardiogram. Echocardiography is a painless test that uses sound waves to create images of your heart. It provides your doctor with information about the size and shape of your heart and how well your heart's chambers and valves are working. This procedure takes approximately one hour. There are no restrictions for this procedure. SCHEDULE LIMITED ECHO FOR JANUARY 2020  [address]  Follow-Up: At [facility], you and your health needs are our priority.  As part of our continuing mission to provide you with exceptional heart care, we have created designated Provider Care Teams.  These Care Teams include your primary Cardiologist (physician) and Advanced Practice Providers (APPs -  Physician Assistants and Nurse Practitioners) who all work together to provide you with the care you need, when you need it. Your physician recommends that you schedule a follow-up appointment in: 3 MONTHS with DR HILTY AFTER LIMITED ECHO  Any Other Special Instructions Will Be Listed Below (If Applicable). ORDER PLACED FOR OXYGEN  URGENT PULMONARY REFERRAL HAS BEEN PLACED-HAO RECOMMENDS DR ALVA OR DR Marchelle Gearing

## 2018-03-02 NOTE — Addendum Note (Signed)
Addended by: Armen Pickup T on: 03/02/2018 03:23 PM   Modules accepted: Orders

## 2018-03-02 NOTE — Telephone Encounter (Signed)
2 L per min as needed with exertion and also at night. Thank you

## 2018-03-02 NOTE — Progress Notes (Addendum)
Cardiology Office Note    Date:  03/02/2018   ID:  Cieara, Stierwalt 1951-05-14, MRN [id number]  PCP:  Aida Puffer, MD  Cardiologist:  Dr. Rennis Golden   Chief Complaint  Patient presents with  . Follow-up    seen for Dr. Rennis Golden. Recent Cath.     History of Present Illness:  KILI GRACY is a 67 y.o. female with PMH of COPD, tobacco abuse and anxiety.  She has significant COPD and requires oxygen at home.  She recently was seen in the hospital on 02/01/2018 after she presented was worsening shortness of breath.  She was admitted for COPD exacerbation.  Telemetry revealed sinus tachycardia, point-of-care troponin was elevated at 1.55 before trending back down.  D-dimer was normal.  Chest x-ray showed hyperaeration most consistent with emphysema, no acute lung disease.  She was treated with nebulizer and steroid.  Cardiology was consulted for elevated troponin.  Echocardiogram obtained on 01/02/2018 showed EF 35 to 40%, dyskinesis noted in the mid apical segment in all walls, weight 1 DD, moderate TR, PA peak pressure 47 mmHg.  She was also treated for acute heart failure as well however majority of her shortness of breath was felt to be pulmonary in nature.  She underwent cardiac catheterization on 02/05/2018 which showed mild nonobstructive CAD involving the proximal LAD and mid RCA, EF using LV gram was 50 to 55%.  Left ventricular pressure was normal.  Medical therapy was recommended.  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  Her Toprol-XL was decreased to 12.5 mg daily.  The plan is to repeat echocardiogram in 3 months to reevaluate the pumping function.  Note, her LDL during this admission was 129, cholesterol was 207.  TSH was normal.  Patient presents today for cardiology office visit.  She denies any obvious chest pain, her heart rate increase quite significantly with minimal exertion.  On physical exam, she has diffusely  decreased breath sounds in bilateral lung consistent with COPD.  She seems to be short of breath with minimal activity, I will refer her urgently to the pulmonology service for management of COPD.  She likely will need a pulmonary function test, I will defer the test to pulmonology service.  We completed a ambulatory O2 test in the office today.  Her starting O2 was 91% with 99 heart rate, at the end of the second lab, her O2 saturation dropped down to 79%.  After placed on 2 L oxygen, her O2 saturation was maintained at 95%.  She qualify for home O2.  She will likely require long-term home O2 use on a as needed basis and every night.  Otherwise, I think she is doing well from cardiology perspective.  I am hesitant to start on ARB or ACE inhibitor at this time given her significant shortness of breath, I will continue on the current medication, we plan to repeat a limited echocardiogram in January to look at the ejection fraction.  She can follow-up with Dr. Rennis Golden after the echocardiogram.  Otherwise, she has no lower extremity edema, orthopnea or PND.  I did not appreciate crackles in the base of the lung to suggest a volume overload.  Note, patient has quit smoking 7 weeks ago and has not picked up since.   SATURATION QUALIFICATIONS: (This note is used to comply with regulatory documentation for home oxygen)  Patient Saturations on Room Air at Rest = 91%  Patient Saturations on [employer]  while Ambulating = 79%  Patient Saturations on 2 Liters of oxygen while Ambulating = 95%  Please briefly explain why patient needs home oxygen:  Significant COPD causing hypoxia and hypercapnia    Past Medical History:  Diagnosis Date  . Anxiety   . COPD (chronic obstructive pulmonary disease) (HCC)   . Lumbar herniated disc   . Tobacco abuse     Past Surgical History:  Procedure Laterality Date  . LEFT HEART CATH AND CORONARY ANGIOGRAPHY N/A 02/05/2018   Procedure: LEFT HEART CATH AND CORONARY  ANGIOGRAPHY;  Surgeon: Yvonne Kendall, MD;  Location: [facility];  Service: Cardiovascular;  Laterality: N/A;    Current Medications: Outpatient Medications Prior to Visit  Medication Sig Dispense Refill  . ALPRAZolam (XANAX) 0.25 MG tablet Take 1 tablet by mouth 2 (two) times daily.  0  . aspirin EC 81 MG EC tablet Take 1 tablet (81 mg total) by mouth daily. 30 tablet 0  . atorvastatin (LIPITOR) 40 MG tablet Take 1 tablet (40 mg total) by mouth daily at 6 PM. 30 tablet 0  . feeding supplement, ENSURE ENLIVE, (ENSURE ENLIVE) LIQD Take 237 mLs by mouth 2 (two) times daily between meals. 60 Bottle 0  . HYDROcodone-acetaminophen (NORCO/VICODIN) 5-325 MG tablet Take 1 tablet by mouth 2 (two) times daily as needed for pain.  0  . ipratropium-albuterol (DUONEB) 0.5-2.5 (3) MG/3ML SOLN Take 3 mLs by nebulization every 6 (six) hours as needed. 360 mL 0  . metoprolol succinate (TOPROL-XL) 25 MG 24 hr tablet Take 0.5 tablets (12.5 mg total) by mouth daily. 60 tablet 0  . PROAIR HFA 108 (90 Base) MCG/ACT inhaler Inhale 2 puffs into the lungs 4 (four) times daily as needed for wheezing. 1 Inhaler 0  . SPIRIVA RESPIMAT 1.25 MCG/ACT AERS Inhale 1 puff into the lungs daily. 1 Inhaler 0   No facility-administered medications prior to visit.      Allergies:   Penicillins   Social History   Socioeconomic History  . Marital status: Widowed    Spouse name: Not on file  . Number of children: Not on file  . Years of education: Not on file  . Highest education level: Not on file  Occupational History  . Not on file  Social Needs  . Financial resource strain: Not on file  . Food insecurity:    Worry: Not on file    Inability: Not on file  . Transportation needs:    Medical: Not on file    Non-medical: Not on file  Tobacco Use  . Smoking status: Former Smoker    Last attempt to quit: 01/07/2018    Years since quitting: 0.1  . Smokeless tobacco: Never Used  . Tobacco comment: Smoked since  age 36, quit 12/2017  Substance and Sexual Activity  . Alcohol use: Yes    Comment: Rare, glass of wine no more than every few weeks  . Drug use: Never  . Sexual activity: Not Currently  Lifestyle  . Physical activity:    Days per week: Not on file    Minutes per session: Not on file  . Stress: Not on file  Relationships  . Social connections:    Talks on phone: Not on file    Gets together: Not on file    Attends religious service: Not on file    Active member of club or organization: Not on file    Attends meetings of clubs or organizations: Not on file  Relationship status: Not on file  Other Topics Concern  . Not on file  Social History Narrative  . Not on file     Family History:  The patient's family history includes Breast cancer in her sister; Liver cancer in her mother.   ROS:   Please see the history of present illness.    ROS All other systems reviewed and are negative.   PHYSICAL EXAM:   VS:  BP 129/86   Pulse 91   Ht 5\' 7"  (1.702 m)   Wt 108 lb 12.8 oz (49.4 kg)   BMI 17.04 kg/m    GEN: cachectic. Frail HEENT: normal  Neck: no JVD, carotid bruits, or masses Cardiac: RRR; no murmurs, rubs, or gallops,no edema  Respiratory: Diffusely decreased breath sound GI: soft, nontender, nondistended, + BS MS: no deformity or atrophy  Skin: warm and dry, no rash Neuro:  Alert and Oriented x 3, Strength and sensation are intact Psych: euthymic mood, full affect  Wt Readings from Last 3 Encounters:  03/02/18 108 lb 12.8 oz (49.4 kg)  02/04/18 104 lb 8 oz (47.4 kg)      Studies/Labs Reviewed:   EKG:  EKG is ordered today.  The ekg ordered today demonstrates normal sinus rhythm diffuse T wave inversion  Recent Labs: 02/01/2018: TSH 0.870 02/03/2018: ALT 23; B Natriuretic Peptide 689.2 02/05/2018: Hemoglobin 14.8; Magnesium 2.5; Platelets 213 02/06/2018: BUN 20; Creatinine, Ser 0.79; Potassium 3.9; Sodium 141   Lipid Panel    Component Value Date/Time    CHOL 207 (H) 02/02/2018 0624   TRIG 43 02/02/2018 0624   HDL 69 02/02/2018 0624   CHOLHDL 3.0 02/02/2018 0624   VLDL 9 02/02/2018 0624   LDLCALC 129 (H) 02/02/2018 0624    Additional studies/ records that were reviewed today include:   Echo 02/02/2018 LV EF: 35% -   40% Study Conclusions  - Left ventricle: The cavity size was normal. Systolic function was   moderately reduced. The estimated ejection fraction was in the   range of 35% to 40%. Hyperdynamic basal segment with dyskinetic   mid-apical segments in all walls. Doppler parameters are   consistent with abnormal left ventricular relaxation (grade 1   diastolic dysfunction). - Aortic valve: Valve area (VTI): 2.68 cm^2. Valve area (Vmax): 3.3   cm^2. Valve area (Vmean): 2.72 cm^2. - Tricuspid valve: There was moderate regurgitation directed   centrally. - Pulmonary arteries: Systolic pressure was mildly increased. PA   peak pressure: 47 mm Hg (S).  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   Cath 02/05/2018 Conclusions: 1. 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 2. Normal left ventricular filling pressure. 3. Low normal left ventricular filling pressure with subtle apical hypokinesis (LVEF 50-55%).  Recommendations: 1. Medical therapy.  No indication for antiplatelet therapy at this time.   ASSESSMENT:    1. Chronic respiratory failure with hypoxia and hypercapnia (HCC)   2. Chronic obstructive pulmonary disease, unspecified COPD type (HCC)   3. Tobacco abuse, in remission   4. Anxiety   5. Stress-induced cardiomyopathy   6. Hyperlipidemia LDL goal <70   7. Coronary artery disease involving native coronary artery of native heart without angina pectoris      PLAN:  In order of problems listed above:  4. Chronic  respiratory failure: Patient qualify for home oxygen based on ambulatory O2 documented  in HPI.  We will set the patient up with home oxygen 2L per min as needed with exertion and at night and refer patient urgently to pulmonology service.  She will likely need pulmonary function test, however will defer this test to her pulmonologist.  5. Stress-induced cardiomyopathy: EF was 35% during acute COPD exacerbation, improved to 50% during cardiac catheterization.  We will repeat a limited echo in 3 months to reassess ejection fraction.  Continue on Toprol-XL 12.5 mg daily.  I did not add ACE inhibitor or ARB given her significant shortness of breath recently.  6. Hyperlipidemia: On Lipitor 40 mg daily.  Fasting lipid panel and LFT in 6 to 8 weeks  7. Former tobacco abuse: Patient quit smoking 7 weeks ago, I congratulated the patient on this.  8. CAD: Minimal disease found on cardiac catheterization, 15% RCA, and 15% LAD disease.  On aspirin and Lipitor.    Medication Adjustments/Labs and Tests Ordered: Current medicines are reviewed at length with the patient today.  Concerns regarding medicines are outlined above.  Medication changes, Labs and Tests ordered today are listed in the Patient Instructions below. Patient Instructions  Medication Instructions:  Your physician recommends that you continue on your current medications as directed. Please refer to the Current Medication list given to you today. If you need a refill on your cardiac medications before your next appointment, please call your pharmacy.   Lab work: Your physician recommends that you return for lab work in: 6-8weeks FASTING LIPID, LFT If you have labs (blood work) drawn today and your tests are completely normal, you will receive your results only by: Marland Kitchen MyChart Message (if you have MyChart) OR . A paper copy in the mail If you have any lab test that is abnormal or we need to change your treatment, we will call you to review the  results.  Testing/Procedures: Your physician has requested that you have an LIMITED Echocardiogram. Echocardiography is a painless test that uses sound waves to create images of your heart. It provides your doctor with information about the size and shape of your heart and how well your heart's chambers and valves are working. This procedure takes approximately one hour. There are no restrictions for this procedure. SCHEDULE LIMITED ECHO FOR JANUARY 2020  [address]  Follow-Up: At [facility], you and your health needs are our priority.  As part of our continuing mission to provide you with exceptional heart care, we have created designated Provider Care Teams.  These Care Teams include your primary Cardiologist (physician) and Advanced Practice Providers (APPs -  Physician Assistants and Nurse Practitioners) who all work together to provide you with the care you need, when you need it. Your physician recommends that you schedule a follow-up appointment in: 3 MONTHS with DR HILTY AFTER LIMITED ECHO  Any Other Special Instructions Will Be Listed Below (If Applicable). ORDER PLACED FOR OXYGEN  URGENT PULMONARY REFERRAL HAS BEEN PLACED-Birl Lobello RECOMMENDS DR Vassie Loll OR DR Marchelle Gearing    Signed, Azalee Course, PA  03/02/2018 3:13 PM    [facility] [address] [place], [place], [place]  [address] Phone: [phone]; Fax: [phone]

## 2018-03-02 NOTE — Telephone Encounter (Signed)
 *  STAT* If patient is at the pharmacy, call can be transferred to refill team.   1. Which medications need to be refilled? (please list name of each medication and dose if known) oxygen, needs to know the lpm  2. Which pharmacy/location (including street and city if local pharmacy) is medication to be sent to?[facility]  3. Do they need a 30 day or 90 day supply? na

## 2018-03-04 ENCOUNTER — Encounter (HOSPITAL_COMMUNITY): Payer: Self-pay | Admitting: Emergency Medicine

## 2018-03-04 ENCOUNTER — Emergency Department (HOSPITAL_COMMUNITY): Payer: Medicare Other

## 2018-03-04 ENCOUNTER — Other Ambulatory Visit: Payer: Self-pay

## 2018-03-04 ENCOUNTER — Inpatient Hospital Stay (HOSPITAL_COMMUNITY)
Admission: EM | Admit: 2018-03-04 | Discharge: 2018-03-06 | DRG: 190 | Disposition: A | Discharge status: Home Health | Payer: Medicare Other | Attending: Internal Medicine | Admitting: Internal Medicine

## 2018-03-04 DIAGNOSIS — F419 Anxiety disorder, unspecified: Secondary | ICD-10-CM

## 2018-03-04 DIAGNOSIS — I252 Old myocardial infarction: Secondary | ICD-10-CM | POA: Diagnosis not present

## 2018-03-04 DIAGNOSIS — Z87891 Personal history of nicotine dependence: Secondary | ICD-10-CM

## 2018-03-04 DIAGNOSIS — Z79899 Other long term (current) drug therapy: Secondary | ICD-10-CM

## 2018-03-04 DIAGNOSIS — Z7951 Long term (current) use of inhaled steroids: Secondary | ICD-10-CM

## 2018-03-04 DIAGNOSIS — Z880 Allergy status to penicillin: Secondary | ICD-10-CM

## 2018-03-04 DIAGNOSIS — E785 Hyperlipidemia, unspecified: Secondary | ICD-10-CM | POA: Diagnosis not present

## 2018-03-04 DIAGNOSIS — I429 Cardiomyopathy, unspecified: Secondary | ICD-10-CM | POA: Diagnosis present

## 2018-03-04 DIAGNOSIS — Z7982 Long term (current) use of aspirin: Secondary | ICD-10-CM | POA: Diagnosis not present

## 2018-03-04 DIAGNOSIS — Z681 Body mass index (BMI) 19 or less, adult: Secondary | ICD-10-CM | POA: Diagnosis not present

## 2018-03-04 DIAGNOSIS — R0902 Hypoxemia: Secondary | ICD-10-CM | POA: Diagnosis present

## 2018-03-04 DIAGNOSIS — E46 Unspecified protein-calorie malnutrition: Secondary | ICD-10-CM | POA: Diagnosis not present

## 2018-03-04 DIAGNOSIS — J441 Chronic obstructive pulmonary disease with (acute) exacerbation: Secondary | ICD-10-CM | POA: Diagnosis not present

## 2018-03-04 DIAGNOSIS — J449 Chronic obstructive pulmonary disease, unspecified: Secondary | ICD-10-CM | POA: Diagnosis present

## 2018-03-04 DIAGNOSIS — I10 Essential (primary) hypertension: Secondary | ICD-10-CM | POA: Diagnosis present

## 2018-03-04 DIAGNOSIS — I2510 Atherosclerotic heart disease of native coronary artery without angina pectoris: Secondary | ICD-10-CM | POA: Diagnosis present

## 2018-03-04 DIAGNOSIS — J9601 Acute respiratory failure with hypoxia: Secondary | ICD-10-CM | POA: Diagnosis present

## 2018-03-04 DIAGNOSIS — F17201 Nicotine dependence, unspecified, in remission: Secondary | ICD-10-CM

## 2018-03-04 DIAGNOSIS — R9431 Abnormal electrocardiogram [ECG] [EKG]: Secondary | ICD-10-CM | POA: Diagnosis not present

## 2018-03-04 DIAGNOSIS — F411 Generalized anxiety disorder: Secondary | ICD-10-CM | POA: Diagnosis present

## 2018-03-04 HISTORY — DX: Heart failure, unspecified: I50.9

## 2018-03-04 LAB — COMPREHENSIVE METABOLIC PANEL
ALBUMIN: 4.1 g/dL (ref 3.5–5.0)
ALT: 25 U/L (ref 0–44)
ANION GAP: 8 (ref 5–15)
AST: 32 U/L (ref 15–41)
Alkaline Phosphatase: 88 U/L (ref 38–126)
BUN: 14 mg/dL (ref 8–23)
CO2: 25 mmol/L (ref 22–32)
Calcium: 9.3 mg/dL (ref 8.9–10.3)
Chloride: 104 mmol/L (ref 98–111)
Creatinine, Ser: 0.78 mg/dL (ref 0.44–1.00)
GFR calc non Af Amer: >60 mL/min (ref >60)
GLUCOSE: 130 mg/dL — AB (ref 70–99)
POTASSIUM: 4.7 mmol/L (ref 3.5–5.1)
SODIUM: 137 mmol/L (ref 135–145)
Total Bilirubin: 1.5 mg/dL — ABNORMAL HIGH (ref 0.3–1.2)
Total Protein: 6.4 g/dL — ABNORMAL LOW (ref 6.5–8.1)

## 2018-03-04 LAB — CBC WITH DIFFERENTIAL/PLATELET
Abs Immature Granulocytes: 0.01 10*3/uL (ref 0.00–0.07)
BASOS ABS: 0.1 10*3/uL (ref 0.0–0.1)
BASOS PCT: 1 %
EOS ABS: 0.2 10*3/uL (ref 0.0–0.5)
EOS PCT: 2 %
HCT: 46.8 % — ABNORMAL HIGH (ref 36.0–46.0)
HEMOGLOBIN: 14.9 g/dL (ref 12.0–15.0)
Immature Granulocytes: 0 %
LYMPHS PCT: 30 %
Lymphs Abs: 2.3 10*3/uL (ref 0.7–4.0)
MCH: 30.3 pg (ref 26.0–34.0)
MCHC: 31.8 g/dL (ref 30.0–36.0)
MCV: 95.1 fL (ref 80.0–100.0)
Monocytes Absolute: 0.7 10*3/uL (ref 0.1–1.0)
Monocytes Relative: 9 %
NRBC: 0 % (ref 0.0–0.2)
Neutro Abs: 4.3 10*3/uL (ref 1.7–7.7)
Neutrophils Relative %: 58 %
Platelets: 264 10*3/uL (ref 150–400)
RBC: 4.92 MIL/uL (ref 3.87–5.11)
RDW: 13 % (ref 11.5–15.5)
WBC: 7.5 10*3/uL (ref 4.0–10.5)

## 2018-03-04 LAB — I-STAT TROPONIN, ED: Troponin i, poc: 0 ng/mL (ref 0.00–0.08)

## 2018-03-04 MED ORDER — SODIUM CHLORIDE 0.9 % IV SOLN
INTRAVENOUS | Status: DC
  Administered 2018-03-04: 22:00:00 via INTRAVENOUS

## 2018-03-04 NOTE — Progress Notes (Signed)
Pt taken off BIPAP at this time and placed on 2L [place]. Pt states that now she is breathing the best she has all day. Pt still with slightly diminished BBS, but with improved aeration overall. RT will continue to monitor.

## 2018-03-04 NOTE — ED Notes (Signed)
The pt reports that she is feeling better  Off bi-pap[ for a trial

## 2018-03-04 NOTE — ED Triage Notes (Signed)
Patient from home, having increased shortness of breath.  Has been using her nebs at home with no relief.  Patient has been given 125mg  solumedrol and 2gm Magnesium, 10mg  Albuterol and 1mg  Atrovent.  She has a history of COPD and CHF and was having relief after being placed on Cpap.  Patient admitted last month for respiratory difficulties and is hypertensive today.

## 2018-03-04 NOTE — H&P (Addendum)
History and Physical    CELEST REITZ ZOX:[id number] DOB: February 17, 1951 DOA: 03/04/2018  Referring MD/NP/PA: Vanetta Mulders, MD PCP: Aida Puffer, MD  Patient coming from: Home via EMS  Chief Complaint: Shortness of breath  I have personally briefly reviewed patient's old medical records in [facility]   HPI: JALAYAH GUTRIDGE is a 67 y.o. female with medical history significant of COPD/emphysema, nonobstructive CAD, HLD,  and tobacco abuse; who presents with shortness of breath.  She just recently been hospitalized from 9/12-9/17 for COPD exacerbation with hypoxic respiratory failure. During the hospitalization she had undergone cardiac catheterization on 02/05/2018 which showed mild nonobstructive CAD involving the proximal LAD and mid RCA, EF using LV gram was 50-55%.  After discharge patient reports completing steroids and was taking Spiriva daily as advised.  However, after following up with her primary care provider she was not continued on the Spiriva.  She reports that she was told that she cannot continue on duonebs and Spiriva.  After running out of the Spiriva he noted that she had been utilizing the albuterol inhaler and DuoNeb nebulizer machine more often.  Patient followed up with cardiology on 10/11, and was noted to desat as low as 79% with ambulation for which patient was in the process of being set up with home oxygen.  She had not received home oxygen yet, and is scheduled to arrive tomorrow.  She also she had not yet received a pulmonology appointment. Over the last few days patient had worsening shortness of breath unable to move even a few steps without being extremely winded.  She complains of associated symptoms of a dry cough, wheezing, insomnia, and fatigue.  She reports using her albuterol inhaler multiple times throughout the day as well as nebulizer without improvement.  Denies any recent sick contacts, fever, chills, nausea, vomiting, chest pain.  Tried Robitussin  without relief.  Due to her symptoms she had been laying in bed much of the day.  She does report that she quit smoking cold [place] 7 weeks ago.  Upon EMS arrival patient was noted to be tripoding and hypoxic.  Patient had been placed on CPAP, given 125 mg of Solu-Medrol, 2 g magnesium sulfate, 10 mg of albuterol, and 1 mg of Atrovent in route.    ED Course: Upon admission into the emergency department patient was seen to be afebrile, pulse 98-111, and respirations 20-36.  Due to significant respiratory distress patient was switched to BiPAP with improvement of respirations.  She was able to be weaned to 2 L nasal cannula oxygen maintaining oxygenation after some time.  Labs were relatively unremarkable occluding CBC, BMP, BNP, and troponin.  Chest x-ray showed only chronic emphysema and bronchiectatic changes.  Patient was given additional breathing treatment and [facility] was called to admit for COPD exacerbation.  Review of Systems  Constitutional: Positive for malaise/fatigue. Negative for fever.  HENT: Negative for ear discharge and nosebleeds.   Eyes: Negative for photophobia and pain.  Respiratory: Positive for cough, shortness of breath and wheezing. Negative for sputum production.   Cardiovascular: Negative for chest pain and leg swelling.  Gastrointestinal: Negative for abdominal pain, constipation, nausea and vomiting.  Genitourinary: Negative for flank pain and frequency.  Musculoskeletal: Negative for back pain and falls.  Skin: Negative for rash.  Neurological: Positive for weakness. Negative for focal weakness and loss of consciousness.  Psychiatric/Behavioral: Negative for substance abuse. The patient is nervous/anxious.     Past Medical History:  Diagnosis Date  .  Anxiety   . CHF (congestive heart failure) (HCC)   . COPD (chronic obstructive pulmonary disease) (HCC)   . Lumbar herniated disc   . Tobacco abuse     Past Surgical History:  Procedure Laterality Date  . LEFT  HEART CATH AND CORONARY ANGIOGRAPHY N/A 02/05/2018   Procedure: LEFT HEART CATH AND CORONARY ANGIOGRAPHY;  Surgeon: Yvonne Kendall, MD;  Location: [facility];  Service: Cardiovascular;  Laterality: N/A;     reports that she quit smoking about 8 weeks ago. She has never used smokeless tobacco. She reports that she drinks alcohol. She reports that she does not use drugs.  Allergies  Allergen Reactions  . Penicillins Swelling    Has patient had a PCN reaction causing immediate rash, facial/tongue/throat swelling, SOB or lightheadedness with hypotension: Yes Has patient had a PCN reaction causing severe rash involving mucus membranes or skin necrosis: No Has patient had a PCN reaction that required hospitalization: No Has patient had a PCN reaction occurring within the last 10 years: No If all of the above answers are "NO", then may proceed with Cephalosporin use.     Family History  Problem Relation Age of Onset  . Liver cancer Mother   . Breast cancer Sister   . CAD Neg Hx     Prior to Admission medications   Medication Sig Start Date End Date Taking? Authorizing Provider  ALPRAZolam Prudy Feeler) 0.25 MG tablet Take 1 tablet by mouth 2 (two) times daily. 02/21/18  Yes [provider]  aspirin EC 81 MG EC tablet Take 1 tablet (81 mg total) by mouth daily. 02/06/18  Yes Ghimire, Werner Lean, MD  atorvastatin (LIPITOR) 40 MG tablet Take 1 tablet (40 mg total) by mouth daily at 6 PM. 02/06/18  Yes Ghimire, Werner Lean, MD  feeding supplement, ENSURE ENLIVE, (ENSURE ENLIVE) LIQD Take 237 mLs by mouth 2 (two) times daily between meals. 02/06/18  Yes Ghimire, Werner Lean, MD  HYDROcodone-acetaminophen (NORCO/VICODIN) 5-325 MG tablet Take 1 tablet by mouth 2 (two) times daily as needed for pain. 11/20/17  Yes [provider]  ipratropium-albuterol (DUONEB) 0.5-2.5 (3) MG/3ML SOLN Take 3 mLs by nebulization every 6 (six) hours as needed. 02/06/18  Yes Ghimire, Werner Lean, MD  metoprolol  succinate (TOPROL-XL) 25 MG 24 hr tablet Take 0.5 tablets (12.5 mg total) by mouth daily. 02/06/18  Yes Ghimire, Werner Lean, MD  PROAIR HFA 108 [phone] Base) MCG/ACT inhaler Inhale 2 puffs into the lungs 4 (four) times daily as needed for wheezing. 02/06/18  Yes Ghimire, Werner Lean, MD  tiotropium (SPIRIVA) 18 MCG inhalation capsule Place 18 mcg into inhaler and inhale daily.   Yes [provider]    Physical Exam:  Constitutional: Thin elderly female in some mild respiratory distress  Vitals:   03/04/18 2245 03/04/18 2300 03/04/18 2310 03/04/18 2315  BP: 133/86 128/83 128/83 133/81  Pulse: 100 98 (!) 102 98  Resp: (!) 25 (!) 29 20 (!) 28  Temp:      TempSrc:      SpO2: 99% 100%  98%  Weight:      Height:       Eyes: PERRL, lids and conjunctivae normal ENMT: Mucous membranes are moist. Posterior pharynx clear of any exudate or lesions.  Neck: normal, supple, no masses, no thyromegaly Respiratory: Mildly tachypneic with decreased overall aeration and positive expiratory wheeze present.  Patient talking in shortened sentences currently on 2 L nasal cannula oxygen with O2 saturation 94%. Cardiovascular: Regular  rate and rhythm, no murmurs / rubs / gallops. No extremity edema. 2+ pedal pulses. No carotid bruits.  Abdomen: no tenderness, no masses palpated. No hepatosplenomegaly. Bowel sounds positive.  Musculoskeletal: no clubbing / cyanosis. No joint deformity upper and lower extremities. Good ROM, no contractures. Normal muscle tone.  Skin: no rashes, lesions, ulcers. No induration Neurologic: CN 2-12 grossly intact. Sensation intact, DTR normal. Strength 5/5 in all 4.  Psychiatric: Normal judgment and insight. Alert and oriented x 3. Normal mood.     Labs on Admission: I have personally reviewed following labs and imaging studies  CBC: Recent Labs  Lab 03/04/18 2146  WBC 7.5  NEUTROABS 4.3  HGB 14.9  HCT 46.8*  MCV 95.1  PLT 264   Basic Metabolic Panel: Recent Labs  Lab  03/04/18 2146  NA 137  K 4.7  CL 104  CO2 25  GLUCOSE 130*  BUN 14  CREATININE 0.78  CALCIUM 9.3   GFR: Estimated Creatinine Clearance: 50.4 mL/min (by C-G formula based on SCr of 0.78 mg/dL). Liver Function Tests: Recent Labs  Lab 03/04/18 2146  AST 32  ALT 25  ALKPHOS 88  BILITOT 1.5*  PROT 6.4*  ALBUMIN 4.1   No results for input(s): LIPASE, AMYLASE in the last 168 hours. No results for input(s): AMMONIA in the last 168 hours. Coagulation Profile: No results for input(s): INR, PROTIME in the last 168 hours. Cardiac Enzymes: No results for input(s): CKTOTAL, CKMB, CKMBINDEX, TROPONINI in the last 168 hours. BNP (last 3 results) No results for input(s): PROBNP in the last 8760 hours. HbA1C: No results for input(s): HGBA1C in the last 72 hours. CBG: No results for input(s): GLUCAP in the last 168 hours. Lipid Profile: No results for input(s): CHOL, HDL, LDLCALC, TRIG, CHOLHDL, LDLDIRECT in the last 72 hours. Thyroid Function Tests: No results for input(s): TSH, T4TOTAL, FREET4, T3FREE, THYROIDAB in the last 72 hours. Anemia Panel: No results for input(s): VITAMINB12, FOLATE, FERRITIN, TIBC, IRON, RETICCTPCT in the last 72 hours. Urine analysis: No results found for: COLORURINE, APPEARANCEUR, LABSPEC, PHURINE, GLUCOSEU, HGBUR, BILIRUBINUR, KETONESUR, PROTEINUR, UROBILINOGEN, NITRITE, LEUKOCYTESUR Sepsis Labs: No results found for this or any previous visit (from the past 240 hour(s)).   Radiological Exams on Admission: Dg Chest Port 1 View  Result Date: 03/04/2018 CLINICAL DATA:  Increasing shortness of breath. History of COPD and CHF. EXAM: PORTABLE CHEST 1 VIEW COMPARISON:  9129 FINDINGS: Heart size and pulmonary vascularity are normal. Emphysematous changes in the lungs. Central interstitial pattern consistent with chronic bronchitis. No airspace disease or consolidation. No blunting of costophrenic angles. No pneumothorax. Mediastinal contours appear intact.  Calcification of the aorta. IMPRESSION: Emphysematous and chronic bronchitic changes in the lungs. No evidence of active pulmonary disease. Electronically Signed   By: Burman Nieves M.D.   On: 03/04/2018 22:06    EKG: Independently reviewed. Sinus tachycardia at 105 bpm QTc 605  Assessment/Plan Respiratory failure with hypoxia, COPD exacerbation: Acute on chronic.  Patient presents with acute worsening shortness of breath and cough over the last few days.  Patient has been trying to use inhalers and nebulizer without relief of symptoms.  On physical exam significant wheezing noted decreased aeration.  Chest x-ray noted emphysematous and chronic bronchitic changes.  Patient had been sent home on Spiriva, but this had not been continued by primary care doctor.  Patient reports some decrease in symptoms and patient to use rescue inhaler when she was on this medication.  She had not been able to  follow-up with pulmonology as of yet.  Prior to arrival in the emergency department tonight had received Solu-Medrol 125 mg,  2 g of magnesium sulfate, and DuoNeb breathing treatments. - Admit to a telemetry bed - COPD order set initiated - Continuous pulse oximetry with nasal cannula oxygen as needed - DuoNebs every 6 hours - Prednisone 40 mg daily - Discontinued azithromycin after QTC noted to be 600 - Mucinex - Tessalon Perles prn Cough - Will need to ensure patient has pulmonology follow-up - Consider restarting Spiriva and/or other maintenance medication  Prolonged QT interval: Acute. Initial QTc noted to be 605. - Recheck EKG - Limit any QT prolonging medications  Nonobstructive coronary artery disease: Patient had cardiac catheterization on 02/05/2018 which showed mild nonobstructive CAD involving the proximal LAD and mid RCA, EF using LV gram was 50 to 55%.  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  No longer on diuretics.  Initial BNP unremarkable and chest x-ray did not show signs of edema.. - Continue to monitor  Anxiety: stable. - Continue Xanax prn   Tobacco abuse: Patient reports quitting smoking 7 weeks ago.  - Encouraged continued cessation of tobacco use.   DVT prophylaxis: lovenox   Code Status: Full  Family Communication: Discussed plan of care with the patient family present at bedside Disposition Plan: Likely discharge home in 2 to 3 days Consults called: None Admission status: Inpatient  Clydie Braun MD [facility] Pager [phone]   If 7PM-7AM, please contact night-coverage [email or web address] Password [email or web address]  03/04/2018, 11:43 PM

## 2018-03-04 NOTE — Progress Notes (Signed)
Pt placed on BIPAP for increased SOB. Neb tx EMS started was ran thru BIPAP until completed. Pt appearing more comfortable at this time. RT will continue to monitor.

## 2018-03-04 NOTE — ED Provider Notes (Signed)
[facility] EMERGENCY DEPARTMENT Provider Note   CSN: [id number] Arrival date & time: 03/04/18  2135     History   Chief Complaint Chief Complaint  Patient presents with  . Shortness of Breath    HPI Vickie Bowman is a 67 y.o. female.  Patient with a known history of COPD.  Patient also has a known history of some mild congestive heart failure.  Patient states she did have increased trouble with her shortness of breath for the past 2 days.  He got severe this evening EMS was called.  Patient was tripoding.  Oxygen saturations were down in the 80s.  Patient was given nebulizer treatment eventually started on CPAP by them.  Upon arrival here patient was still breathing about 32 times a minute no wheezing noted.  Patient switched over to BiPAP.  Patient denied any chest pain.  Chart review shows that patient was seen after an admission about a month ago by cardiology and they seem to document the patient needed home oxygen.  Certainly with exertion she her oxygen sats went down into the 80s.  But at rest they were in the 90s.  Patient has not had a home oxygen set up yet.  Family members think that is coming out tomorrow.  EMS gave patient magnesium albuterol treatment with Atrovent and 125 mg of Solu-Medrol.  When patient arrived here we continued the albuterol treatment.     Past Medical History:  Diagnosis Date  . Anxiety   . CHF (congestive heart failure) (HCC)   . COPD (chronic obstructive pulmonary disease) (HCC)   . Lumbar herniated disc   . Tobacco abuse     Patient Active Problem List   Diagnosis Date Noted  . Elevated troponin   . COPD exacerbation (HCC) 02/03/2018  . Anxiety 02/03/2018  . Acute HFrEF (heart failure with reduced ejection fraction) (HCC) 02/03/2018  . Non-ST elevation (NSTEMI) myocardial infarction (HCC)   . Hyperlipidemia LDL goal <70   . Underweight due to inadequate caloric intake   . Tobacco abuse, in remission   .  Cardiomyopathy (HCC)   . Protein-calorie malnutrition, severe 02/02/2018  . Acute respiratory failure with hypoxia (HCC) 02/01/2018    Past Surgical History:  Procedure Laterality Date  . LEFT HEART CATH AND CORONARY ANGIOGRAPHY N/A 02/05/2018   Procedure: LEFT HEART CATH AND CORONARY ANGIOGRAPHY;  Surgeon: Yvonne Kendall, MD;  Location: [facility] INVASIVE CV LAB;  Service: Cardiovascular;  Laterality: N/A;     OB History   None      Home Medications    Prior to Admission medications   Medication Sig Start Date End Date Taking? Authorizing Provider  ALPRAZolam Prudy Feeler) 0.25 MG tablet Take 1 tablet by mouth 2 (two) times daily. 02/21/18  Yes [provider]  aspirin EC 81 MG EC tablet Take 1 tablet (81 mg total) by mouth daily. 02/06/18  Yes Ghimire, Werner Lean, MD  atorvastatin (LIPITOR) 40 MG tablet Take 1 tablet (40 mg total) by mouth daily at 6 PM. 02/06/18  Yes Ghimire, Werner Lean, MD  feeding supplement, ENSURE ENLIVE, (ENSURE ENLIVE) LIQD Take 237 mLs by mouth 2 (two) times daily between meals. 02/06/18  Yes Ghimire, Werner Lean, MD  HYDROcodone-acetaminophen (NORCO/VICODIN) 5-325 MG tablet Take 1 tablet by mouth 2 (two) times daily as needed for pain. 11/20/17  Yes [provider]  ipratropium-albuterol (DUONEB) 0.5-2.5 (3) MG/3ML SOLN Take 3 mLs by nebulization every 6 (six) hours as needed. 02/06/18  Yes Ghimire,  Werner Lean, MD  metoprolol succinate (TOPROL-XL) 25 MG 24 hr tablet Take 0.5 tablets (12.5 mg total) by mouth daily. 02/06/18  Yes Ghimire, Werner Lean, MD  PROAIR HFA [phone] Base) MCG/ACT inhaler Inhale 2 puffs into the lungs 4 (four) times daily as needed for wheezing. 02/06/18  Yes Ghimire, Werner Lean, MD  tiotropium (SPIRIVA) 18 MCG inhalation capsule Place 18 mcg into inhaler and inhale daily.   Yes [provider]    Family History Family History  Problem Relation Age of Onset  . Liver cancer Mother   . Breast cancer Sister   . CAD Neg Hx     Social  History Social History   Tobacco Use  . Smoking status: Former Smoker    Last attempt to quit: 01/07/2018    Years since quitting: 0.1  . Smokeless tobacco: Never Used  . Tobacco comment: Smoked since age 26, quit 12/2017  Substance Use Topics  . Alcohol use: Yes    Comment: Rare, glass of wine no more than every few weeks  . Drug use: Never     Allergies   Penicillins   Review of Systems Review of Systems  Constitutional: Negative for fever.  HENT: Negative for congestion.   Eyes: Negative for redness.  Respiratory: Positive for shortness of breath and wheezing. Negative for cough.   Cardiovascular: Negative for chest pain.  Gastrointestinal: Negative for abdominal pain.  Genitourinary: Negative for dysuria.  Musculoskeletal: Negative for back pain.  Skin: Negative for wound.  Neurological: Negative for headaches.  Hematological: Does not bruise/bleed easily.  Psychiatric/Behavioral: Negative for confusion.     Physical Exam Updated Vital Signs BP 128/83   Pulse 98   Temp 97.7 F (36.5 C) (Axillary)   Resp (!) 29   Ht 1.727 m (5\' 8" )   Wt 48.1 kg   SpO2 100%   BMI 16.12 kg/m   Physical Exam  Constitutional: She is oriented to person, place, and time. She appears well-developed and well-nourished. She appears distressed.  HENT:  Head: Normocephalic and atraumatic.  Mouth/Throat: Oropharynx is clear and moist.  Eyes: Pupils are equal, round, and reactive to light. Conjunctivae and EOM are normal.  Neck: Neck supple.  Cardiovascular: Regular rhythm.  Tachycardic  Pulmonary/Chest: She is in respiratory distress. She has no wheezes.  Abdominal: Soft. Bowel sounds are normal. There is no tenderness.  Musculoskeletal: Normal range of motion. She exhibits no edema.  Neurological: She is alert and oriented to person, place, and time. No cranial nerve deficit or sensory deficit. She exhibits normal muscle tone. Coordination normal.  Skin: Skin is warm.  Nursing  note and vitals reviewed.    ED Treatments / Results  Labs (all labs ordered are listed, but only abnormal results are displayed) Labs Reviewed  CBC WITH DIFFERENTIAL/PLATELET - Abnormal; Notable for the following components:      Result Value   HCT 46.8 (*)    All other components within normal limits  COMPREHENSIVE METABOLIC PANEL - Abnormal; Notable for the following components:   Glucose, Bld 130 (*)    Total Protein 6.4 (*)    Total Bilirubin 1.5 (*)    All other components within normal limits  BRAIN NATRIURETIC PEPTIDE  I-STAT TROPONIN, ED    EKG EKG Interpretation  Date/Time:  Sunday March 04 2018 21:41:44 EDT Ventricular Rate:  106 PR Interval:    QRS Duration: 84 QT Interval:  461 QTC Calculation: 613 R Axis:   83 Text Interpretation:  Sinus  tachycardia Biatrial enlargement Borderline right axis deviation Probable anteroseptal infarct, old Borderline repolarization abnormality Prolonged QT interval Confirmed by Vanetta Mulders [phone]) on 03/04/2018 9:45:10 PM   Radiology Dg Chest Port 1 View  Result Date: 03/04/2018 CLINICAL DATA:  Increasing shortness of breath. History of COPD and CHF. EXAM: PORTABLE CHEST 1 VIEW COMPARISON:  9129 FINDINGS: Heart size and pulmonary vascularity are normal. Emphysematous changes in the lungs. Central interstitial pattern consistent with chronic bronchitis. No airspace disease or consolidation. No blunting of costophrenic angles. No pneumothorax. Mediastinal contours appear intact. Calcification of the aorta. IMPRESSION: Emphysematous and chronic bronchitic changes in the lungs. No evidence of active pulmonary disease. Electronically Signed   By: Burman Nieves M.D.   On: 03/04/2018 22:06    Procedures Procedures (including critical care time)  CRITICAL CARE Performed by: Vanetta Mulders Total critical care time: 30 minutes Critical care time was exclusive of separately billable procedures and treating other  patients. Critical care was necessary to treat or prevent imminent or life-threatening deterioration. Critical care was time spent personally by me on the following activities: development of treatment plan with patient and/or surrogate as well as nursing, discussions with consultants, evaluation of patient's response to treatment, examination of patient, obtaining history from patient or surrogate, ordering and performing treatments and interventions, ordering and review of laboratory studies, ordering and review of radiographic studies, pulse oximetry and re-evaluation of patient's condition.   Medications Ordered in ED Medications  0.9 %  sodium chloride infusion ( Intravenous New Bag/Given 03/04/18 2218)     Initial Impression / Assessment and Plan / ED Course  I have reviewed the triage vital signs and the nursing notes.  Pertinent labs & imaging results that were available during my care of the patient were reviewed by me and considered in my medical decision making (see chart for details).     Patient still in respiratory distress upon arrival however no wheezing.  Switched over to BiPAP.  Continued on BiPAP for period of time patient started to feel more comfortable respiratory rate came down.  Patient removed from the BiPAP and switched over to 2 L nasal cannula oxygen.  Satting low 90 percentiles with dad and comfortable.  Chest x-ray without any acute findings.  Labs without any significant abnormality.  Troponin was normal.  BNP not elevated.  EKG just showed sinus tachycardia.  It will be important patient goes home with oxygen available at home.  It appears that she does best on 2 L of oxygen.  Final Clinical Impressions(s) / ED Diagnoses   Final diagnoses:  COPD exacerbation [facility])  Hypoxia    ED Discharge Orders    None       Vanetta Mulders, MD 03/05/18 0011

## 2018-03-05 ENCOUNTER — Other Ambulatory Visit: Payer: Self-pay

## 2018-03-05 ENCOUNTER — Telehealth: Payer: Self-pay | Admitting: Physician Assistant

## 2018-03-05 DIAGNOSIS — J441 Chronic obstructive pulmonary disease with (acute) exacerbation: Secondary | ICD-10-CM | POA: Diagnosis not present

## 2018-03-05 DIAGNOSIS — Z7982 Long term (current) use of aspirin: Secondary | ICD-10-CM | POA: Diagnosis not present

## 2018-03-05 DIAGNOSIS — F419 Anxiety disorder, unspecified: Secondary | ICD-10-CM | POA: Diagnosis not present

## 2018-03-05 DIAGNOSIS — Z681 Body mass index (BMI) 19 or less, adult: Secondary | ICD-10-CM | POA: Diagnosis not present

## 2018-03-05 DIAGNOSIS — Z87891 Personal history of nicotine dependence: Secondary | ICD-10-CM | POA: Diagnosis not present

## 2018-03-05 DIAGNOSIS — Z7951 Long term (current) use of inhaled steroids: Secondary | ICD-10-CM | POA: Diagnosis not present

## 2018-03-05 DIAGNOSIS — I2510 Atherosclerotic heart disease of native coronary artery without angina pectoris: Secondary | ICD-10-CM | POA: Diagnosis not present

## 2018-03-05 DIAGNOSIS — I252 Old myocardial infarction: Secondary | ICD-10-CM | POA: Diagnosis not present

## 2018-03-05 DIAGNOSIS — I10 Essential (primary) hypertension: Secondary | ICD-10-CM | POA: Diagnosis not present

## 2018-03-05 DIAGNOSIS — E46 Unspecified protein-calorie malnutrition: Secondary | ICD-10-CM | POA: Diagnosis not present

## 2018-03-05 DIAGNOSIS — J9601 Acute respiratory failure with hypoxia: Secondary | ICD-10-CM | POA: Diagnosis not present

## 2018-03-05 DIAGNOSIS — E785 Hyperlipidemia, unspecified: Secondary | ICD-10-CM | POA: Diagnosis not present

## 2018-03-05 DIAGNOSIS — R9431 Abnormal electrocardiogram [ECG] [EKG]: Secondary | ICD-10-CM | POA: Diagnosis not present

## 2018-03-05 DIAGNOSIS — I429 Cardiomyopathy, unspecified: Secondary | ICD-10-CM | POA: Diagnosis not present

## 2018-03-05 DIAGNOSIS — Z79899 Other long term (current) drug therapy: Secondary | ICD-10-CM | POA: Diagnosis not present

## 2018-03-05 DIAGNOSIS — R0902 Hypoxemia: Secondary | ICD-10-CM | POA: Diagnosis present

## 2018-03-05 DIAGNOSIS — Z880 Allergy status to penicillin: Secondary | ICD-10-CM | POA: Diagnosis not present

## 2018-03-05 LAB — BASIC METABOLIC PANEL
Anion gap: 7 (ref 5–15)
BUN: 12 mg/dL (ref 8–23)
CHLORIDE: 110 mmol/L (ref 98–111)
CO2: 25 mmol/L (ref 22–32)
Calcium: 8.8 mg/dL — ABNORMAL LOW (ref 8.9–10.3)
Creatinine, Ser: 0.75 mg/dL (ref 0.44–1.00)
GFR calc non Af Amer: >60 mL/min (ref >60)
Glucose, Bld: 154 mg/dL — ABNORMAL HIGH (ref 70–99)
POTASSIUM: 3.9 mmol/L (ref 3.5–5.1)
SODIUM: 142 mmol/L (ref 135–145)

## 2018-03-05 LAB — BRAIN NATRIURETIC PEPTIDE: B Natriuretic Peptide: 12.8 pg/mL (ref 0.0–100.0)

## 2018-03-05 LAB — CBC
HEMATOCRIT: 44.4 % (ref 36.0–46.0)
HEMOGLOBIN: 14 g/dL (ref 12.0–15.0)
MCH: 30.4 pg (ref 26.0–34.0)
MCHC: 31.5 g/dL (ref 30.0–36.0)
MCV: 96.3 fL (ref 80.0–100.0)
Platelets: 228 10*3/uL (ref 150–400)
RBC: 4.61 MIL/uL (ref 3.87–5.11)
RDW: 12.9 % (ref 11.5–15.5)
WBC: 5.7 10*3/uL (ref 4.0–10.5)
nRBC: 0 % (ref 0.0–0.2)

## 2018-03-05 MED ORDER — ATORVASTATIN CALCIUM 40 MG PO TABS
40.0000 mg | ORAL_TABLET | Freq: Every day | ORAL | Status: DC
  Administered 2018-03-05: 40 mg via ORAL
  Filled 2018-03-05: qty 1

## 2018-03-05 MED ORDER — ASPIRIN EC 81 MG PO TBEC
81.0000 mg | DELAYED_RELEASE_TABLET | Freq: Every day | ORAL | Status: DC
  Administered 2018-03-05 – 2018-03-06 (×2): 81 mg via ORAL
  Filled 2018-03-05 (×2): qty 1

## 2018-03-05 MED ORDER — IPRATROPIUM-ALBUTEROL 0.5-2.5 (3) MG/3ML IN SOLN
3.0000 mL | Freq: Four times a day (QID) | RESPIRATORY_TRACT | Status: DC
  Administered 2018-03-05: 3 mL via RESPIRATORY_TRACT

## 2018-03-05 MED ORDER — ALPRAZOLAM 0.25 MG PO TABS: 0.2500 mg | ORAL_TABLET | Freq: Two times a day (BID) | ORAL | Status: DC | PRN

## 2018-03-05 MED ORDER — ONDANSETRON HCL 4 MG/2ML IJ SOLN: 4.0000 mg | Freq: Four times a day (QID) | INTRAMUSCULAR | Status: DC | PRN

## 2018-03-05 MED ORDER — BENZONATATE 100 MG PO CAPS
200.0000 mg | ORAL_CAPSULE | ORAL | Status: AC
  Administered 2018-03-05: 200 mg via ORAL

## 2018-03-05 MED ORDER — ADULT MULTIVITAMIN W/MINERALS CH
1.0000 | ORAL_TABLET | Freq: Every day | ORAL | Status: DC
  Administered 2018-03-05 – 2018-03-06 (×2): 1 via ORAL
  Filled 2018-03-05 (×2): qty 1

## 2018-03-05 MED ORDER — PREDNISONE 20 MG PO TABS
40.0000 mg | ORAL_TABLET | Freq: Every day | ORAL | Status: DC
  Administered 2018-03-05 – 2018-03-06 (×2): 40 mg via ORAL
  Filled 2018-03-05 (×2): qty 2

## 2018-03-05 MED ORDER — BENZONATATE 100 MG PO CAPS
200.0000 mg | ORAL_CAPSULE | Freq: Three times a day (TID) | ORAL | Status: DC
  Administered 2018-03-05 – 2018-03-06 (×4): 200 mg via ORAL
  Filled 2018-03-05 (×4): qty 2

## 2018-03-05 MED ORDER — ONDANSETRON HCL 4 MG PO TABS: 4.0000 mg | ORAL_TABLET | Freq: Four times a day (QID) | ORAL | Status: DC | PRN

## 2018-03-05 MED ORDER — SODIUM CHLORIDE 0.9 % IV SOLN
500.0000 mg | INTRAVENOUS | Status: DC
  Filled 2018-03-05: qty 500

## 2018-03-05 MED ORDER — ACETAMINOPHEN 325 MG PO TABS: 650.0000 mg | ORAL_TABLET | Freq: Four times a day (QID) | ORAL | Status: DC | PRN

## 2018-03-05 MED ORDER — ENSURE ENLIVE PO LIQD
237.0000 mL | Freq: Two times a day (BID) | ORAL | Status: DC
  Administered 2018-03-05 – 2018-03-06 (×2): 237 mL via ORAL

## 2018-03-05 MED ORDER — IPRATROPIUM-ALBUTEROL 0.5-2.5 (3) MG/3ML IN SOLN
3.0000 mL | Freq: Three times a day (TID) | RESPIRATORY_TRACT | Status: DC
  Administered 2018-03-05 – 2018-03-06 (×5): 3 mL via RESPIRATORY_TRACT
  Filled 2018-03-05 (×5): qty 3

## 2018-03-05 MED ORDER — AZITHROMYCIN 250 MG PO TABS: 500.0000 mg | ORAL_TABLET | Freq: Every day | ORAL | Status: DC

## 2018-03-05 MED ORDER — SODIUM CHLORIDE 0.9% FLUSH
3.0000 mL | Freq: Two times a day (BID) | INTRAVENOUS | Status: DC
  Administered 2018-03-05 – 2018-03-06 (×3): 3 mL via INTRAVENOUS

## 2018-03-05 MED ORDER — ACETAMINOPHEN 650 MG RE SUPP: 650.0000 mg | Freq: Four times a day (QID) | RECTAL | Status: DC | PRN

## 2018-03-05 MED ORDER — GUAIFENESIN ER 600 MG PO TB12
600.0000 mg | ORAL_TABLET | Freq: Two times a day (BID) | ORAL | Status: DC
  Administered 2018-03-05 – 2018-03-06 (×3): 600 mg via ORAL
  Filled 2018-03-05 (×3): qty 1

## 2018-03-05 MED ORDER — IPRATROPIUM-ALBUTEROL 0.5-2.5 (3) MG/3ML IN SOLN
3.0000 mL | RESPIRATORY_TRACT | Status: DC | PRN
  Administered 2018-03-06 (×2): 3 mL via RESPIRATORY_TRACT
  Filled 2018-03-05 (×2): qty 3

## 2018-03-05 MED ORDER — BENZONATATE 100 MG PO CAPS
200.0000 mg | ORAL_CAPSULE | Freq: Three times a day (TID) | ORAL | Status: DC | PRN
  Filled 2018-03-05: qty 2

## 2018-03-05 MED ORDER — METOPROLOL SUCCINATE ER 25 MG PO TB24
12.5000 mg | ORAL_TABLET | Freq: Every day | ORAL | Status: DC
  Administered 2018-03-05 – 2018-03-06 (×2): 12.5 mg via ORAL
  Filled 2018-03-05 (×2): qty 1

## 2018-03-05 MED ORDER — ENOXAPARIN SODIUM 40 MG/0.4ML ~~LOC~~ SOLN
40.0000 mg | Freq: Every day | SUBCUTANEOUS | Status: DC
  Filled 2018-03-05 (×2): qty 0.4

## 2018-03-05 MED ORDER — ALPRAZOLAM 0.25 MG PO TABS
0.2500 mg | ORAL_TABLET | Freq: Two times a day (BID) | ORAL | Status: DC | PRN
  Administered 2018-03-05 – 2018-03-06 (×3): 0.25 mg via ORAL
  Filled 2018-03-05 (×3): qty 1

## 2018-03-05 NOTE — Care Management Note (Addendum)
Case Management Note  Patient Details  Name: DEVEN AUDI MRN: [id number] Date of Birth: Sep 26, 1950  Subjective/Objective:   From home with her brother and his wife, copd ex, will need home oxygen, HHRN,HHPT, HHOT, she chose [facility] for DME and [facility] services. Jermaine brought oxygen to patient's room and referral given to Lupita Leash for [facility] services. Soc will begin 24-48 hrs post dc.   10/15 Letha Cape RN, BSN - [facility]ita Leash with [facility] states patient has refused [facility] services.  She feels she does not need this since her sister will be helping her.                  Action/Plan: DC home when ready.   Expected Discharge Date:                  Expected Discharge Plan:  Home w Home Health Services  In-House Referral:     Discharge planning Services  CM Consult  Post Acute Care Choice:  Durable Medical Equipment, Home Health Choice offered to:  Patient  DME Arranged:  Oxygen DME Agency:  [facility]  HH Arranged:  RN, PT, OT, Disease Management HH Agency:  [facility]  Status of Service:  Completed, signed off  If discussed at Long Length of Stay Meetings, dates discussed:    Additional Comments:  Leone Haven, RN 03/05/2018, 4:50 PM

## 2018-03-05 NOTE — Progress Notes (Signed)
Pt continues to do well off of BIPAP. Order was D/C'd by ED physician, BIPAP removed from room at this time. RT will continue to monitor.

## 2018-03-05 NOTE — Progress Notes (Signed)
SATURATION QUALIFICATIONS: (This note is used to comply with regulatory documentation for home oxygen)  Patient Saturations on Room Air at Rest = 93%  Patient Saturations on Room Air while Ambulating =85%  Patient Saturations on 3 Liters of oxygen while Ambulating = 94%  With any activity pt displays with increased WOB/DOE. With ambulation pt's O2 on RA dropped to 83%  Ignacia Palma, OTR/L [facility] [employer] Pager [phone] Office [phone]

## 2018-03-05 NOTE — Progress Notes (Signed)
[facility] Progress Note  Patient: Vickie Bowman ZOX:[id number]   PCP: Aida Puffer, MD DOB: Aug 06, 1950   DOA: 03/04/2018   DOS: 03/05/2018   Date of Service: the patient was seen and examined on 03/05/2018  Brief hospital course: Pt. with PMH of COPD-emphysema, CAD, HLD, smoker; admitted on 03/04/2018, presented with complaint of shortness of breath, was found to have COPD exacerbation probably from viral bronchitis. Currently further plan is continue current care.  Subjective: Feeling better.  Still on 4 L of oxygen.  No chest pain.  Has some cough.  No nausea no vomiting.  Oral intake is better.  Assessment and Plan: 1.  COPD exacerbation. Acute hypoxic respiratory failure. Progressive worsening of breathing as well as cough. No fever no chills at home. No chest pain right now. Still has bilateral expiratory wheezing and bronchospasm as well as tachypneic and talking in short sentences. Continue duo nebs, continue prednisone.  Continue Mucinex.  Continue Tessalon Perles. Antibiotics were discontinued although I suspect that this is more likely a viral exacerbation and therefore would not introduce antibiotics again. Continue maintenance therapy will provide new prescriptions on discharge. Patient will likely require oxygen going home.  2.  Prolonged QTC. Repeat EKG shows QTC of 483.  Monitor.  3.  Nontoxic CAD. Continue aspirin and statin.  4.  Essential hypertension. On beta-blocker.  May require to change this therapy later, for now continue Diet: Cardiac diet DVT Prophylaxis: subcutaneous Heparin  Advance goals of care discussion: full code  Family Communication: no family was present at bedside, at the time of interview.   Disposition:  Discharge to home.  Consultants: none Procedures: none  Scheduled Meds: . aspirin EC  81 mg Oral Daily  . atorvastatin  40 mg Oral q1800  . benzonatate  200 mg Oral TID  . enoxaparin (LOVENOX) injection  40 mg  Subcutaneous Daily  . feeding supplement (ENSURE ENLIVE)  237 mL Oral BID BM  . guaiFENesin  600 mg Oral BID  . ipratropium-albuterol  3 mL Nebulization TID  . metoprolol succinate  12.5 mg Oral Daily  . multivitamin with minerals  1 tablet Oral Daily  . predniSONE  40 mg Oral Q breakfast  . sodium chloride flush  3 mL Intravenous Q12H   Continuous Infusions: PRN Meds: acetaminophen **OR** acetaminophen, ALPRAZolam, ipratropium-albuterol Antibiotics: Anti-infectives (From admission, onward)   Start     Dose/Rate Route Frequency Ordered Stop   03/05/18 2200  azithromycin (ZITHROMAX) tablet 500 mg  Status:  Discontinued     500 mg Oral Daily at bedtime 03/05/18 0022 03/05/18 0100   03/05/18 0030  azithromycin (ZITHROMAX) 500 mg in sodium chloride 0.9 % 250 mL IVPB  Status:  Discontinued     500 mg 250 mL/hr over 60 Minutes Intravenous Every 24 hours 03/05/18 0022 03/05/18 0100       Objective: Physical Exam: Vitals:   03/05/18 0745 03/05/18 0755 03/05/18 1343 03/05/18 1700  BP:  131/83  133/88  Pulse:  86  99  Resp:  14  20  Temp:  (!) 97.4 F (36.3 C)    TempSrc:  Oral    SpO2: 94% 100% 98% 94%  Weight:      Height:        Intake/Output Summary (Last 24 hours) at 03/05/2018 1805 Last data filed at 03/05/2018 0953 Gross per 24 hour  Intake 3 ml  Output -  Net 3 ml   Filed Weights   03/04/18 2142 03/05/18 0207  Weight:  48.1 kg 49.5 kg   General: Alert, Awake and Oriented to Time, Place and Person. Appear in mild distress, affect appropriate Eyes: PERRL, Conjunctiva normal ENT: Oral Mucosa clear moist. Neck: no JVD, no Abnormal Mass Or lumps Cardiovascular: S1 and S2 Present, no Murmur, Peripheral Pulses Present Respiratory: increased respiratory effort, Bilateral Air entry equal and Decreased, no use of accessory muscle, no Crackles, bilateral expiratory  wheezes Abdomen: Bowel Sound present, Soft and no tenderness, no hernia Skin: no redness, no Rash, no  induration Extremities: no Pedal edema, no calf tenderness Neurologic: Grossly no focal neuro deficit. Bilaterally Equal motor strength  Data Reviewed: CBC: Recent Labs  Lab 03/04/18 2146 03/05/18 0327  WBC 7.5 5.7  NEUTROABS 4.3  --   HGB 14.9 14.0  HCT 46.8* 44.4  MCV 95.1 96.3  PLT 264 228   Basic Metabolic Panel: Recent Labs  Lab 03/04/18 2146 03/05/18 0327  NA 137 142  K 4.7 3.9  CL 104 110  CO2 25 25  GLUCOSE 130* 154*  BUN 14 12  CREATININE 0.78 0.75  CALCIUM 9.3 8.8*   Liver Function Tests: Recent Labs  Lab 03/04/18 2146  AST 32  ALT 25  ALKPHOS 88  BILITOT 1.5*  PROT 6.4*  ALBUMIN 4.1   No results for input(s): LIPASE, AMYLASE in the last 168 hours. No results for input(s): AMMONIA in the last 168 hours. Coagulation Profile: No results for input(s): INR, PROTIME in the last 168 hours. Cardiac Enzymes: No results for input(s): CKTOTAL, CKMB, CKMBINDEX, TROPONINI in the last 168 hours. BNP (last 3 results) No results for input(s): PROBNP in the last 8760 hours. CBG: No results for input(s): GLUCAP in the last 168 hours. Studies: Dg Chest Port 1 View  Result Date: 03/04/2018 CLINICAL DATA:  Increasing shortness of breath. History of COPD and CHF. EXAM: PORTABLE CHEST 1 VIEW COMPARISON:  9129 FINDINGS: Heart size and pulmonary vascularity are normal. Emphysematous changes in the lungs. Central interstitial pattern consistent with chronic bronchitis. No airspace disease or consolidation. No blunting of costophrenic angles. No pneumothorax. Mediastinal contours appear intact. Calcification of the aorta. IMPRESSION: Emphysematous and chronic bronchitic changes in the lungs. No evidence of active pulmonary disease. Electronically Signed   By: Burman Nieves M.D.   On: 03/04/2018 22:06     Time spent: 35 minutes  Author: Lynden Oxford, MD [facility] Pager: [phone] 03/05/2018 6:05 PM  If 7PM-7AM, please contact night-coverage at  [email or web address], password [facility]

## 2018-03-05 NOTE — ED Notes (Signed)
Dr Katrinka Blazing disc the zithromycin drip  After it was checked off

## 2018-03-05 NOTE — ED Notes (Signed)
Report called to rn on 2w 

## 2018-03-05 NOTE — Telephone Encounter (Signed)
Spoke with Barbara Cower from [facility] who states he is needing a form for O2 signed by Azalee Course, PA. Received fax and returned call to inform that form has been signed and faxed back.

## 2018-03-05 NOTE — Progress Notes (Addendum)
Initial Nutrition Assessment  DOCUMENTATION CODES:   Underweight, Severe malnutrition in context of chronic illness  INTERVENTION:  Ensure Enlive BID. Each supplement provides 350 calories and 20 grams protein. Patient prefers vanilla or strawberry flavor.  MVI with minerals daily  NUTRITION DIAGNOSIS:   Severe Malnutrition related to chronic illness(COPD) as evidenced by moderate fat depletion, severe fat depletion, moderate muscle depletion, severe muscle depletion  GOAL:   Patient will meet greater than or equal to 90% of their needs   MONITOR:   PO intake, Supplement acceptance, Weight trends  REASON FOR ASSESSMENT:   Malnutrition Screening Tool   ASSESSMENT:   67 yo female with PMH of CAD and severe protein-calorie malnutrition admitted for SOB due to COPD exacerbation.   Student dietitian visited patient at bedside today. She reports her appetite has been good while in the hospital. PTA her appetite was not good "for a few weeks" because she didn't have supplemental oxygen and had shortness of breath. Normal intake at home includes yogurt, ham sandwiches, and Ensure supplements during the day, and steak for dinner or out to a restaurant. She reports being tired of chocolate Ensure and requests other flavors. No MVI use at home. She says she used to weigh 115-120 lbs; weight today is 109 lbs. She denies unintentional weight loss. Limited weight history available. Patient requests to know more about what she should be eating. Provided brief education on eating energy dense foods to increase caloric intake.    Filed Weights   03/04/18 2142 03/05/18 0207  Weight: 48.1 kg 49.5 kg    Patient was last seen by RD during previous admission 9/13; was identified as having severe malnutrition in context of chronic illness (COPD). Patient continues to meet criteria at this time due to her fat and muscle depletions.  Medications and labs reviewed.  NUTRITION - FOCUSED PHYSICAL  EXAM:    Most Recent Value  Orbital Region  Moderate depletion  Upper Arm Region  Severe depletion  Thoracic and Lumbar Region  Severe depletion  Buccal Region  Moderate depletion  Temple Region  Moderate depletion  Clavicle Bone Region  Moderate depletion  Clavicle and Acromion Bone Region  Moderate depletion  Scapular Bone Region  Moderate depletion  Dorsal Hand  Moderate depletion  Patellar Region  Severe depletion  Anterior Thigh Region  Severe depletion  Posterior Calf Region  Severe depletion  Edema (RD Assessment)  None  Hair  Reviewed  Eyes  Reviewed  Mouth  Reviewed [uses dentures]  Skin  Reviewed  Nails  Reviewed      Diet Order:   Diet Order            Diet Heart Room service appropriate? Yes; Fluid consistency: Thin  Diet effective now              EDUCATION NEEDS:   Education needs have been addressed  Skin:  Skin Assessment: Reviewed RN Assessment  Last BM:  10/12  Height:   Ht Readings from Last 1 Encounters:  03/05/18 5\' 7"  (1.702 m)    Weight:   Wt Readings from Last 1 Encounters:  03/05/18 49.5 kg    Ideal Body Weight:  61.4 kg  BMI:  Body mass index is 17.09 kg/m.  Estimated Nutritional Needs:   Kcal:  1500-1700  Protein:  75-85 grams  Fluid:  >/=1.5 L   Trustin Chapa, Dietetic Intern [phone]

## 2018-03-05 NOTE — Evaluation (Signed)
Occupational Therapy Evaluation Patient Details Name: Vickie Bowman MRN: [id number] DOB: 12-25-1950 Today's Date: 03/05/2018    History of Present Illness Pt is a 67 y/o female with a PMH significant for COPD/emphysema, CAD, who presents with SOB. She was recently hospitalized from 9/12-9/17 for COPD exacerbation with hypoxic respiratory failure. After d/c pt had worsening SOB and DOE while waiting for home O2 to get set up. In the ED, she was switched to BiPAP due to worsening respiratory status but was able to be weaned to nasal cannula. Pt admitted for COPD exacerbation with respiratory failure.    Clinical Impression   This 67 yo female admitted with above seen today for evaluation with pt being able to complete basic ADLs at setup/S level with VCs for managing SOB with purse lipped breathing and sitting rest breaks as well as managing O2 tubing. She will continue to benefit from acute OT with follow up HHOT.    Follow Up Recommendations  Home health OT;Supervision/Assistance - 24 hour    Equipment Recommendations  None recommended by OT       Precautions / Restrictions Precautions Precautions: Fall Precaution Comments: monitor O2 Restrictions Weight Bearing Restrictions: No      Mobility Bed Mobility Overal bed mobility: Independent                Transfers Overall transfer level: Needs assistance Equipment used: None Transfers: Sit to/from Stand Sit to Stand: Supervision         General transfer comment: Needs A to manage O2 tubing    Balance Overall balance assessment: Mild deficits observed, not formally tested                                         ADL either performed or assessed with clinical judgement   ADL Overall ADL's : Needs assistance/impaired Eating/Feeding: Independent;Sitting   Grooming: Wash/dry face;Wash/dry hands;Standing   Upper Body Bathing: Set up;Supervision/ safety;Sitting   Lower Body Bathing: Set up;Sit  to/from stand   Upper Body Dressing : Set up;Supervision/safety;Sitting   Lower Body Dressing: Set up;Supervision/safety;Sit to/from stand   Toilet Transfer: Regular Toilet;Grab bars;Min guard Toilet Transfer Details (indicate cue type and reason): A to manage O2 tubing Toileting- Clothing Manipulation and Hygiene: Supervision/safety;Sit to/from stand         General ADL Comments: We talked about her leaving her O2 on when she showers and to sit to shower on seat that she can get from brother to use at dtrs house. Educated on purse lipped breathing with repetitions of 5. Also talked about sitting to do tasks as much as possible v.standing to conserve energy     Vision Baseline Vision/History: No visual deficits              Pertinent Vitals/Pain Pain Assessment: No/denies pain     Hand Dominance Right   Extremity/Trunk Assessment Upper Extremity Assessment Upper Extremity Assessment: Overall WFL for tasks assessed           Communication Communication Communication: No difficulties   Cognition Arousal/Alertness: Awake/alert Behavior During Therapy: WFL for tasks assessed/performed Overall Cognitive Status: Within Functional Limits for tasks assessed  Home Living Family/patient expects to be discharged to:: Private residence Living Arrangements: Other relatives(brother) Available Help at Discharge: Family;Available 24 hours/day Type of Home: Mobile home Home Access: Stairs to enter Entrance Stairs-Number of Steps: 4-5 Entrance Stairs-Rails: Right;Left;Can reach both Home Layout: One level     Bathroom Shower/Tub: Walk-in shower         Home Equipment: Shower seat   Additional Comments: Will be staying at dtrs house for now. Has W/C and RW, and BSC she can borrow if needed      Prior Functioning/Environment Level of Independence: Independent                 OT Problem List:  Decreased activity tolerance;Cardiopulmonary status limiting activity      OT Treatment/Interventions: Self-care/ADL training;Patient/family education;Energy conservation    OT Goals(Current goals can be found in the care plan section) Acute Rehab OT Goals Patient Stated Goal: back to taking care of myself and my regular routine OT Goal Formulation: With patient Time For Goal Achievement: 03/19/18 Potential to Achieve Goals: Good  OT Frequency: Min 2X/week              AM-PAC PT "6 Clicks" Daily Activity     Outcome Measure Help from another person eating meals?: None Help from another person taking care of personal grooming?: A Little Help from another person toileting, which includes using toliet, bedpan, or urinal?: A Little Help from another person bathing (including washing, rinsing, drying)?: A Little Help from another person to put on and taking off regular upper body clothing?: A Little Help from another person to put on and taking off regular lower body clothing?: A Little 6 Click Score: 19   End of Session Equipment Utilized During Treatment: Oxygen Nurse Communication: (I documented O2 sats in PN)  Activity Tolerance: (pt had to rest serveral times while washhing up at sink to catch her breath) Patient left: in bed  OT Visit Diagnosis: Other (comment)(Cardio pulmonary issues)                Time: 1610-9604 OT Time Calculation (min): 47 min Charges:  OT General Charges $OT Visit: 1 Visit OT Evaluation $OT Eval Moderate Complexity: 1 Mod OT Treatments $Self Care/Home Management : 23-37 mins  Ignacia Palma, OTR/L [facility] [employer] Pager [phone] Office [phone]

## 2018-03-05 NOTE — Evaluation (Signed)
Physical Therapy Evaluation Patient Details Name: Vickie Bowman MRN: [id number] DOB: May 07, 1951 Today's Date: 03/05/2018   History of Present Illness  Pt is a 67 y/o female with a PMH significant for COPD/emphysema, CAD, who presents with SOB. She was recently hospitalized from 9/12-9/17 for COPD exacerbation with hypoxic respiratory failure. After d/c pt had worsening SOB and DOE while waiting for home O2 to get set up. In the ED, she was switched to BiPAP due to worsening respiratory status but was able to be weaned to nasal cannula. Pt admitted for COPD exacerbation with respiratory failure.   Clinical Impression  Pt admitted with above diagnosis. Pt currently with functional limitations due to the deficits listed below (see PT Problem List). At the time of PT eval pt reports feeling weak, but mobilized well. Tolerance for functional activity is low and limited distance as well as frequent standing rest breaks were required during gait training. O2 sats dropping as low as 76% on 3L/min supplemental O2, however improved quickly back to 90% with seated rest break. Pt will benefit from skilled PT to increase their independence and safety with mobility to allow discharge to the venue listed below.       Follow Up Recommendations Home health PT;Supervision for mobility/OOB    Equipment Recommendations  None recommended by PT    Recommendations for Other Services       Precautions / Restrictions Precautions Precautions: Fall Restrictions Weight Bearing Restrictions: No      Mobility  Bed Mobility Overal bed mobility: Modified Independent Bed Mobility: Supine to Sit     Supine to sit: Modified independent (Device/Increase time);HOB elevated     General bed mobility comments: HOB elevated and increased time required. Overall pt was able to transition to EOB without difficulty.   Transfers Overall transfer level: Needs assistance Equipment used: None Transfers: Sit to/from  Stand Sit to Stand: Supervision         General transfer comment: Pt was able to power-up to full stand without assistance. Slight unsteadiness noted but no assist required to recover.   Ambulation/Gait Ambulation/Gait assistance: Min guard;Min assist Gait Distance (Feet): 140 Feet Assistive device: None Gait Pattern/deviations: Step-through pattern;Decreased stride length;Trunk flexed;Narrow base of support Gait velocity: Decreased Gait velocity interpretation: 1.31 - 2.62 ft/sec, indicative of limited community ambulator General Gait Details: Slow and guarded. Pt was cued for pursed-lip breathing and sats dropping as low as 76% on 3L/min supplemental O2. Upon seated rest able to be improved back to 90%. Occasional unsteadiness noted as pt fatigued with min assist to recover. Grossly min guard otherwise.   Stairs            Wheelchair Mobility    Modified Rankin (Stroke Patients Only)       Balance Overall balance assessment: Mild deficits observed, not formally tested                                           Pertinent Vitals/Pain Pain Assessment: No/denies pain    Home Living Family/patient expects to be discharged to:: Private residence Living Arrangements: Other relatives(Brother) Available Help at Discharge: Family;Available 24 hours/day Type of Home: House Home Access: Stairs to enter Entrance Stairs-Rails: Right;Left;Can reach both Entrance Stairs-Number of Steps: 4-5 Home Layout: One level Home Equipment: None Additional Comments: Brother has w/c, RW, BSC from mother that she can use if needed  Prior Function Level of Independence: Independent               Hand Dominance        Extremity/Trunk Assessment   Upper Extremity Assessment Upper Extremity Assessment: Defer to OT evaluation    Lower Extremity Assessment Lower Extremity Assessment: Generalized weakness    Cervical / Trunk Assessment Cervical / Trunk  Assessment: Other exceptions Cervical / Trunk Exceptions: Forward head posture with rounded shoulders  Communication   Communication: No difficulties  Cognition Arousal/Alertness: Awake/alert Behavior During Therapy: WFL for tasks assessed/performed Overall Cognitive Status: Within Functional Limits for tasks assessed                                        General Comments      Exercises     Assessment/Plan    PT Assessment Patient needs continued PT services  PT Problem List Decreased strength;Decreased activity tolerance;Decreased balance;Decreased mobility;Decreased knowledge of use of DME;Decreased safety awareness;Decreased knowledge of precautions;Cardiopulmonary status limiting activity       PT Treatment Interventions DME instruction;Gait training;Stair training;Functional mobility training;Therapeutic activities;Therapeutic exercise;Neuromuscular re-education;Patient/family education    PT Goals (Current goals can be found in the Care Plan section)  Acute Rehab PT Goals Patient Stated Goal: Back to PLOF PT Goal Formulation: With patient Time For Goal Achievement: 03/12/18 Potential to Achieve Goals: Good    Frequency Min 3X/week   Barriers to discharge        Co-evaluation               AM-PAC PT "6 Clicks" Daily Activity  Outcome Measure Difficulty turning over in bed (including adjusting bedclothes, sheets and blankets)?: None Difficulty moving from lying on back to sitting on the side of the bed? : None Difficulty sitting down on and standing up from a chair with arms (e.g., wheelchair, bedside commode, etc,.)?: A Little Help needed moving to and from a bed to chair (including a wheelchair)?: A Little Help needed walking in hospital room?: A Little Help needed climbing 3-5 steps with a railing? : A Little 6 Click Score: 20    End of Session Equipment Utilized During Treatment: Gait belt Activity Tolerance: Patient tolerated  treatment well Patient left: in chair;with call bell/phone within reach Nurse Communication: Mobility status PT Visit Diagnosis: Unsteadiness on feet (R26.81);Difficulty in walking, not elsewhere classified (R26.2)    Time: 1610-9604 PT Time Calculation (min) (ACUTE ONLY): 26 min   Charges:   PT Evaluation $PT Eval Moderate Complexity: 1 Mod PT Treatments $Gait Training: 8-22 mins        Conni Slipper, PT, DPT [facility] Pager: [phone] Office: [phone]   Marylynn Pearson 03/05/2018, 10:43 AM

## 2018-03-06 DIAGNOSIS — J449 Chronic obstructive pulmonary disease, unspecified: Secondary | ICD-10-CM | POA: Diagnosis present

## 2018-03-06 DIAGNOSIS — J441 Chronic obstructive pulmonary disease with (acute) exacerbation: Secondary | ICD-10-CM | POA: Diagnosis not present

## 2018-03-06 DIAGNOSIS — R0902 Hypoxemia: Secondary | ICD-10-CM | POA: Diagnosis not present

## 2018-03-06 MED ORDER — GUAIFENESIN ER 600 MG PO TB12: 600.0000 mg | ORAL_TABLET | Freq: Two times a day (BID) | ORAL | 0 refills | Status: DC

## 2018-03-06 MED ORDER — IPRATROPIUM-ALBUTEROL 0.5-2.5 (3) MG/3ML IN SOLN: 3.0000 mL | Freq: Four times a day (QID) | RESPIRATORY_TRACT | 0 refills | Status: DC | PRN

## 2018-03-06 MED ORDER — TIOTROPIUM BROMIDE MONOHYDRATE 18 MCG IN CAPS: 18.0000 ug | ORAL_CAPSULE | Freq: Every day | RESPIRATORY_TRACT | 0 refills | Status: DC

## 2018-03-06 MED ORDER — MOMETASONE FURO-FORMOTEROL FUM 100-5 MCG/ACT IN AERO: 2.0000 | INHALATION_SPRAY | Freq: Two times a day (BID) | RESPIRATORY_TRACT | 0 refills | Status: DC

## 2018-03-06 MED ORDER — PREDNISONE 10 MG PO TABS: ORAL_TABLET | ORAL | 0 refills | Status: DC

## 2018-03-06 MED ORDER — BENZONATATE 100 MG PO CAPS: 100.0000 mg | ORAL_CAPSULE | Freq: Three times a day (TID) | ORAL | 0 refills | Status: AC

## 2018-03-06 MED FILL — MUCUS RELIEF 600 MG TB12: 600 | 15 days supply | Qty: 30 | Fill #0

## 2018-03-06 MED FILL — IPRAT-ALBUT 0.5-3(2.5) MG/3: 0.5-2.5 (3) | 30 days supply | Qty: 360 | Fill #0

## 2018-03-06 MED FILL — predniSONE 10 MG TABS: 10 | 12 days supply | Qty: 30 | Fill #0

## 2018-03-06 MED FILL — BENZONATATE 100 MG CAP: 100 | 4 days supply | Qty: 12 | Fill #0

## 2018-03-06 MED FILL — DULERA 100 MCG/5 MCG INH: 100-5 | 30 days supply | Qty: 13 | Fill #0

## 2018-03-06 MED FILL — SPIRIVA 18 MCG CP-HANDIHALE: 18 | 30 days supply | Qty: 30 | Fill #0

## 2018-03-06 NOTE — Progress Notes (Signed)
SATURATION QUALIFICATIONS: (This note is used to comply with regulatory documentation for home oxygen)  Patient Saturations on Room Air at Rest = 88%  Patient Saturations on Room Air while Ambulating = 80%  Patient Saturations on 3 Liters of oxygen while Ambulating = 91%  Please briefly explain why patient needs home oxygen:  Patient hypoxic and dyspneic without supplemental oxygen.  Sheran Lawless, [place] [facility] [phone] 03/06/2018

## 2018-03-06 NOTE — Discharge Summary (Signed)
[facility] Discharge Summary   Patient: Vickie Bowman UJW:[id number]   PCP: Aida Puffer, MD DOB: 04-15-1951   Date of admission: 03/04/2018   Date of discharge: 03/06/2018     Discharge Diagnoses:  Principal Problem:   COPD exacerbation (HCC) Active Problems:   Acute respiratory failure with hypoxia (HCC)   Anxiety   Tobacco abuse, in remission   CAD (coronary artery disease)   COPD (chronic obstructive pulmonary disease) (HCC)   Admitted From: home Disposition:  home  Recommendations for Outpatient Follow-up:  1. Please follow up with PCP in 1 week   Follow-up Information    [facility] - Dme Follow up.   Why:  oxygen Contact information: [address] [place] [address] [phone]        Health, [facility]-Home Follow up.   Specialty:  Home Health Services Why:  [facility], HHPT, HHOT Contact information: [address] [place] [address] [phone]        Aida Puffer, MD. Schedule an appointment as soon as possible for a visit in 1 week(s).   Specialty:  Family Medicine Contact information: [address] [place] [address] [phone]          Diet recommendation: cardiac diet  Activity: The patient is advised to gradually reintroduce usual activities.  Discharge Condition: good  Code Status: full code  History of present illness: As per the H and P dictated on admission, "Vickie Bowman is a 67 y.o. female with medical history significant of COPD/emphysema, nonobstructive CAD, HLD,  and tobacco abuse; who presents with shortness of breath.  She just recently been hospitalized from 9/12-9/17 for COPD exacerbation with hypoxic respiratory failure. During the hospitalization she had undergone cardiac catheterization on 02/05/2018 which showed mild nonobstructive CAD involving the proximal LAD and mid RCA, EF using LV gram was 50-55%.  After discharge patient reports completing steroids and was  taking Spiriva daily as advised.  However, after following up with her primary care provider she was not continued on the Spiriva.  She reports that she was told that she cannot continue on duonebs and Spiriva.  After running out of the Spiriva he noted that she had been utilizing the albuterol inhaler and DuoNeb nebulizer machine more often.  Patient followed up with cardiology on 10/11, and was noted to desat as low as 79% with ambulation for which patient was in the process of being set up with home oxygen.  She had not received home oxygen yet, and is scheduled to arrive tomorrow.  She also she had not yet received a pulmonology appointment. Over the last few days patient had worsening shortness of breath unable to move even a few steps without being extremely winded.  She complains of associated symptoms of a dry cough, wheezing, insomnia, and fatigue.  She reports using her albuterol inhaler multiple times throughout the day as well as nebulizer without improvement.  Denies any recent sick contacts, fever, chills, nausea, vomiting, chest pain.  Tried Robitussin without relief.  Due to her symptoms she had been laying in bed much of the day.  She does report that she quit smoking cold [place] 7 weeks ago.  Upon EMS arrival patient was noted to be tripoding and hypoxic.  Patient had been placed on CPAP, given 125 mg of Solu-Medrol, 2 g magnesium sulfate, 10 mg of albuterol, and 1 mg of Atrovent in route.    ED Course: Upon admission into the emergency department  patient was seen to be afebrile, pulse 98-111, and respirations 20-36.  Due to significant respiratory distress patient was switched to BiPAP with improvement of respirations.  She was able to be weaned to 2 L nasal cannula oxygen maintaining oxygenation after some time.  Labs were relatively unremarkable occluding CBC, BMP, BNP, and troponin.  Chest x-ray showed only chronic emphysema and bronchiectatic changes.  Patient was given additional  breathing treatment and [facility] was called to admit for COPD exacerbation."  Hospital Course:  Summary of her active problems in the hospital is as following. 1.  COPD exacerbation. Acute hypoxic respiratory failure. Progressive worsening of breathing as well as cough. No fever no chills at home. No chest pain right now. Still has bilateral expiratory wheezing and bronchospasm as well as tachypneic and talking in short sentences. Continue duo nebs, continue prednisone.  Continue Mucinex.  Continue Tessalon Perles. Antibiotics were discontinued although I suspect that this is more likely a viral exacerbation and therefore would not introduce antibiotics again. Continue maintenance therapy will provide new prescriptions on discharge. Patient will require oxygen going home.  2.  Prolonged QTC. Repeat EKG shows QTC of 483.  Monitor.  3.  Nontoxic CAD. Continue aspirin and statin.  4.  Essential hypertension. On beta-blocker.  May require to change this therapy later, for now continue  All other chronic medical condition were stable during the hospitalization.  Patient was seeen by physical therapy, who recommended home health, which was arranged by Child psychotherapist and case Production designer, theatre/television/film. On the day of the discharge the patient's vitals were stable , and no other acute medical condition were reported by patient. the patient was felt safe to be discharge at home with familu.  Consultants: none Procedures: none  DISCHARGE MEDICATION: Allergies as of 03/06/2018      Reactions   Penicillins Swelling   Has patient had a PCN reaction causing immediate rash, facial/tongue/throat swelling, SOB or lightheadedness with hypotension: Yes Has patient had a PCN reaction causing severe rash involving mucus membranes or skin necrosis: No Has patient had a PCN reaction that required hospitalization: No Has patient had a PCN reaction occurring within the last 10 years: No If all of the above answers are "NO",  then may proceed with Cephalosporin use.      Medication List    TAKE these medications   ALPRAZolam 0.25 MG tablet Commonly known as:  XANAX Take 1 tablet by mouth 2 (two) times daily.   aspirin 81 MG EC tablet Take 1 tablet (81 mg total) by mouth daily.   atorvastatin 40 MG tablet Commonly known as:  LIPITOR Take 1 tablet (40 mg total) by mouth daily at 6 PM.   benzonatate 100 MG capsule Commonly known as:  TESSALON Take 1 capsule (100 mg total) by mouth 3 (three) times daily for 4 days.   feeding supplement (ENSURE ENLIVE) Liqd Take 237 mLs by mouth 2 (two) times daily between meals.   guaiFENesin 600 MG 12 hr tablet Commonly known as:  MUCINEX Take 1 tablet (600 mg total) by mouth 2 (two) times daily.   HYDROcodone-acetaminophen 5-325 MG tablet Commonly known as:  NORCO/VICODIN Take 1 tablet by mouth 2 (two) times daily as needed for pain.   ipratropium-albuterol 0.5-2.5 (3) MG/3ML Soln Commonly known as:  DUONEB Take 3 mLs by nebulization every 6 (six) hours as needed.   metoprolol succinate 25 MG 24 hr tablet Commonly known as:  TOPROL-XL Take 0.5 tablets (12.5 mg total) by mouth daily.  mometasone-formoterol 100-5 MCG/ACT Aero Commonly known as:  DULERA Inhale 2 puffs into the lungs 2 (two) times daily.   predniSONE 10 MG tablet Commonly known as:  DELTASONE Take 40mg  daily for 3days,Take 30mg  daily for 3days,Take 20mg  daily for 3days,Take 10mg  daily for 3days, then stop.   PROAIR HFA 108 (90 Base) MCG/ACT inhaler Generic drug:  albuterol Inhale 2 puffs into the lungs 4 (four) times daily as needed for wheezing.   tiotropium 18 MCG inhalation capsule Commonly known as:  SPIRIVA Place 1 capsule (18 mcg total) into inhaler and inhale daily. What changed:  when to take this      Allergies  Allergen Reactions  . Penicillins Swelling    Has patient had a PCN reaction causing immediate rash, facial/tongue/throat swelling, SOB or lightheadedness with  hypotension: Yes Has patient had a PCN reaction causing severe rash involving mucus membranes or skin necrosis: No Has patient had a PCN reaction that required hospitalization: No Has patient had a PCN reaction occurring within the last 10 years: No If all of the above answers are "NO", then may proceed with Cephalosporin use.     Discharge Exam: Filed Weights   03/04/18 2142 03/05/18 0207  Weight: 48.1 kg 49.5 kg   Vitals:   03/06/18 1124 03/06/18 1338  BP:    Pulse:    Resp:    Temp:    SpO2: 93% 92%   General: Appear in mild distress, no Rash; Oral Mucosa moist. Cardiovascular: S1 and S2 Present, no Murmur, no JVD Respiratory: Bilateral Air entry present and Clear to Auscultation, no Crackles, no wheezes Abdomen: Bowel Sound present, Soft and no tenderness Extremities: no Pedal edema, no calf tenderness Neurology: Grossly no focal neuro deficit.  The results of significant diagnostics from this hospitalization (including imaging, microbiology, ancillary and laboratory) are listed below for reference.    Significant Diagnostic Studies: Dg Chest Port 1 View  Result Date: 03/04/2018 CLINICAL DATA:  Increasing shortness of breath. History of COPD and CHF. EXAM: PORTABLE CHEST 1 VIEW COMPARISON:  9129 FINDINGS: Heart size and pulmonary vascularity are normal. Emphysematous changes in the lungs. Central interstitial pattern consistent with chronic bronchitis. No airspace disease or consolidation. No blunting of costophrenic angles. No pneumothorax. Mediastinal contours appear intact. Calcification of the aorta. IMPRESSION: Emphysematous and chronic bronchitic changes in the lungs. No evidence of active pulmonary disease. Electronically Signed   By: Burman Nieves M.D.   On: 03/04/2018 22:06    Microbiology: No results found for this or any previous visit (from the past 240 hour(s)).   Labs: CBC: Recent Labs  Lab 03/04/18 2146 03/05/18 0327  WBC 7.5 5.7  NEUTROABS 4.3  --    HGB 14.9 14.0  HCT 46.8* 44.4  MCV 95.1 96.3  PLT 264 228   Basic Metabolic Panel: Recent Labs  Lab 03/04/18 2146 03/05/18 0327  NA 137 142  K 4.7 3.9  CL 104 110  CO2 25 25  GLUCOSE 130* 154*  BUN 14 12  CREATININE 0.78 0.75  CALCIUM 9.3 8.8*   Liver Function Tests: Recent Labs  Lab 03/04/18 2146  AST 32  ALT 25  ALKPHOS 88  BILITOT 1.5*  PROT 6.4*  ALBUMIN 4.1   No results for input(s): LIPASE, AMYLASE in the last 168 hours. No results for input(s): AMMONIA in the last 168 hours. Cardiac Enzymes: No results for input(s): CKTOTAL, CKMB, CKMBINDEX, TROPONINI in the last 168 hours. BNP (last 3 results) Recent Labs    02/03/18  2956 03/04/18 2146  BNP 689.2* 12.8   CBG: No results for input(s): GLUCAP in the last 168 hours. Time spent: 35 minutes  Signed:  Lynden Oxford  [facility] 03/06/2018   , 11:13 PM

## 2018-03-06 NOTE — Telephone Encounter (Signed)
Vickie Bowman received form yesterday from [facility] and completed/signed and faxed back.

## 2018-03-06 NOTE — Progress Notes (Signed)
Physical Therapy Treatment Patient Details Name: Vickie Bowman MRN: [id number] DOB: 02/18/1951 Today's Date: 03/06/2018    History of Present Illness Pt is a 67 y/o female with a PMH significant for COPD/emphysema, CAD, who presents with SOB. She was recently hospitalized from 9/12-9/17 for COPD exacerbation with hypoxic respiratory failure. After d/c pt had worsening SOB and DOE while waiting for home O2 to get set up. In the ED, she was switched to BiPAP due to worsening respiratory status but was able to be weaned to nasal cannula. Pt admitted for COPD exacerbation with respiratory failure.     PT Comments    Patient progressing slowly and somewhat self limited due to anxiety.  Asking for breathing treatment after ambulation this session.  She will obviously need home  O2 so HHPT indicated due to general weakness and needing instruction on safe management of O2 at home.  Feel stable to d/c when medically ready as reports will have daughter with her at home.     Follow Up Recommendations  Home health PT     Equipment Recommendations  None recommended by PT    Recommendations for Other Services       Precautions / Restrictions Precautions Precautions: Fall Precaution Comments: monitor O2    Mobility  Bed Mobility Overal bed mobility: Independent                Transfers     Transfers: Sit to/from Stand Sit to Stand: Supervision         General transfer comment: set up for O2 management  Ambulation/Gait Ambulation/Gait assistance: Min guard Gait Distance (Feet): 160 Feet Assistive device: None Gait Pattern/deviations: Step-through pattern;Decreased stride length;Trunk flexed     General Gait Details: occasional touching sink, surfaces in room, minguard for safety in hallway; on RA in room SpO2 dropped to 80%, on 3L O2 SpO2 maintained at 91%   Stairs             Wheelchair Mobility    Modified Rankin (Stroke Patients Only)       Balance  Overall balance assessment: Mild deficits observed, not formally tested                                          Cognition Arousal/Alertness: Awake/alert Behavior During Therapy: Anxious Overall Cognitive Status: Within Functional Limits for tasks assessed                                        Exercises      General Comments        Pertinent Vitals/Pain      Home Living                      Prior Function            PT Goals (current goals can now be found in the care plan section) Progress towards PT goals: Progressing toward goals    Frequency    Min 3X/week      PT Plan Current plan remains appropriate    Co-evaluation              AM-PAC PT "6 Clicks" Daily Activity  Outcome Measure  Difficulty turning over in bed (including adjusting bedclothes, sheets and blankets)?: None Difficulty  moving from lying on back to sitting on the side of the bed? : None Difficulty sitting down on and standing up from a chair with arms (e.g., wheelchair, bedside commode, etc,.)?: A Little Help needed moving to and from a bed to chair (including a wheelchair)?: A Little Help needed walking in hospital room?: A Little Help needed climbing 3-5 steps with a railing? : A Little 6 Click Score: 20    End of Session Equipment Utilized During Treatment: Gait belt;Oxygen Activity Tolerance: Patient tolerated treatment well Patient left: with call bell/phone within reach;in bed   PT Visit Diagnosis: Unsteadiness on feet (R26.81);Muscle weakness (generalized) (M62.81)     Time: 1100-1116 PT Time Calculation (min) (ACUTE ONLY): 16 min  Charges:  $Gait Training: 8-22 mins                     Sheran Lawless, [facility] [phone] 03/06/2018    Elray Mcgregor 03/06/2018, 11:24 AM

## 2018-03-15 ENCOUNTER — Institutional Professional Consult (permissible substitution): Payer: Medicare Other | Admitting: Pulmonary Disease

## 2018-03-27 ENCOUNTER — Encounter: Payer: Self-pay | Admitting: Pulmonary Disease

## 2018-03-27 ENCOUNTER — Ambulatory Visit (INDEPENDENT_AMBULATORY_CARE_PROVIDER_SITE_OTHER): Payer: Medicare Other | Admitting: Pulmonary Disease

## 2018-03-27 VITALS — BP 108/70 | HR 71 | Ht 67.0 in | Wt 113.4 lb

## 2018-03-27 DIAGNOSIS — R0602 Shortness of breath: Secondary | ICD-10-CM | POA: Diagnosis not present

## 2018-03-27 DIAGNOSIS — J449 Chronic obstructive pulmonary disease, unspecified: Secondary | ICD-10-CM | POA: Diagnosis not present

## 2018-03-27 DIAGNOSIS — J9611 Chronic respiratory failure with hypoxia: Secondary | ICD-10-CM | POA: Diagnosis not present

## 2018-03-27 DIAGNOSIS — Z720 Tobacco use: Secondary | ICD-10-CM | POA: Diagnosis not present

## 2018-03-27 MED ORDER — PROAIR HFA 108 (90 BASE) MCG/ACT IN AERS: 2.0000 | INHALATION_SPRAY | Freq: Four times a day (QID) | RESPIRATORY_TRACT | 5 refills | Status: DC | PRN

## 2018-03-27 MED ORDER — IPRATROPIUM-ALBUTEROL 0.5-2.5 (3) MG/3ML IN SOLN: 3.0000 mL | Freq: Four times a day (QID) | RESPIRATORY_TRACT | 0 refills | Status: DC | PRN

## 2018-03-27 MED ORDER — MOMETASONE FURO-FORMOTEROL FUM 100-5 MCG/ACT IN AERO: 2.0000 | INHALATION_SPRAY | Freq: Two times a day (BID) | RESPIRATORY_TRACT | 0 refills | Status: DC

## 2018-03-27 MED ORDER — VITAMIN D 50 MCG (2000 UT) PO CAPS: 2000.0000 [IU] | ORAL_CAPSULE | Freq: Every day | ORAL | 5 refills | Status: AC

## 2018-03-27 MED ORDER — TIOTROPIUM BROMIDE MONOHYDRATE 18 MCG IN CAPS: 18.0000 ug | ORAL_CAPSULE | Freq: Every day | RESPIRATORY_TRACT | 5 refills | Status: DC

## 2018-03-27 NOTE — Progress Notes (Signed)
Synopsis: Referred in November 2019 for COPD by Aida Puffer, MD  Subjective:   PATIENT ID: Vickie Bowman GENDER: female DOB: 1950/06/27, MRN: [id number]  Chief Complaint  Patient presents with  . Consult    COPD, hospitilized twice this year, quit smoking august 28th, recently started on oxygen, uses 3L pulsed.     PMH of CHF, tobacco abuse, quit smoking Aug 2019. Smoked since age 67 years.  Patient was admitted to the hospital in September 2019 as well as in October 2019 for COPD exacerbations.  The patient's most recent hospitalization for COPD she developed hypoxemic respiratory failure requiring nasal cannula supplementation.  Initial echocardiogram revealed a reduced ejection fraction and she was subsequently taken for a left heart cath in September 2019 which revealed nonobstructive CAD.  The patient was treated in the emergency room with CPAP, steroids, magnesium, albuterol.  Hospital course was relatively uncomplicated and the patient was discharged home on oxygen therapy.  Since her hospitalization she has been overall been doing well.  She does have significant dyspnea on exertion.  She feels like she has slowly been recovering.  She does acknowledge that she has been able to quit smoking.  She is currently on 3 L pulse from a POC.  She states that she has been trying to eat.  She does know that her BMI is low.  Patient denies chest pain, hemoptysis, daily sputum production.    Past Medical History:  Diagnosis Date  . Anxiety   . CHF (congestive heart failure) (HCC)   . COPD (chronic obstructive pulmonary disease) (HCC)   . Lumbar herniated disc   . Tobacco abuse      Family History  Problem Relation Age of Onset  . Liver cancer Mother   . Breast cancer Sister   . CAD Neg Hx      Past Surgical History:  Procedure Laterality Date  . LEFT HEART CATH AND CORONARY ANGIOGRAPHY N/A 02/05/2018   Procedure: LEFT HEART CATH AND CORONARY ANGIOGRAPHY;  Surgeon: Yvonne Kendall, MD;  Location: [facility];  Service: Cardiovascular;  Laterality: N/A;    Social History   Socioeconomic History  . Marital status: Widowed    Spouse name: Not on file  . Number of children: Not on file  . Years of education: Not on file  . Highest education level: Not on file  Occupational History  . Not on file  Social Needs  . Financial resource strain: Not on file  . Food insecurity:    Worry: Not on file    Inability: Not on file  . Transportation needs:    Medical: Not on file    Non-medical: Not on file  Tobacco Use  . Smoking status: Former Smoker    Last attempt to quit: 01/07/2018    Years since quitting: 0.2  . Smokeless tobacco: Never Used  . Tobacco comment: Smoked since age 8, quit 12/2017  Substance and Sexual Activity  . Alcohol use: Yes    Comment: Rare, glass of wine no more than every few weeks  . Drug use: Never  . Sexual activity: Not Currently  Lifestyle  . Physical activity:    Days per week: Not on file    Minutes per session: Not on file  . Stress: Not on file  Relationships  . Social connections:    Talks on phone: Not on file    Gets together: Not on file    Attends religious service: Not on file  Active member of club or organization: Not on file    Attends meetings of clubs or organizations: Not on file    Relationship status: Not on file  . Intimate partner violence:    Fear of current or ex partner: Not on file    Emotionally abused: Not on file    Physically abused: Not on file    Forced sexual activity: Not on file  Other Topics Concern  . Not on file  Social History Narrative  . Not on file     Allergies  Allergen Reactions  . Penicillins Swelling    Has patient had a PCN reaction causing immediate rash, facial/tongue/throat swelling, SOB or lightheadedness with hypotension: Yes Has patient had a PCN reaction causing severe rash involving mucus membranes or skin necrosis: No Has patient had a PCN  reaction that required hospitalization: No Has patient had a PCN reaction occurring within the last 10 years: No If all of the above answers are "NO", then may proceed with Cephalosporin use.      Outpatient Medications Prior to Visit  Medication Sig Dispense Refill  . ALPRAZolam (XANAX) 0.25 MG tablet Take 1 tablet by mouth 2 (two) times daily.  0  . aspirin EC 81 MG EC tablet Take 1 tablet (81 mg total) by mouth daily. 30 tablet 0  . atorvastatin (LIPITOR) 40 MG tablet Take 1 tablet (40 mg total) by mouth daily at 6 PM. 30 tablet 0  . feeding supplement, ENSURE ENLIVE, (ENSURE ENLIVE) LIQD Take 237 mLs by mouth 2 (two) times daily between meals. 60 Bottle 0  . guaiFENesin (MUCINEX) 600 MG 12 hr tablet Take 1 tablet (600 mg total) by mouth 2 (two) times daily. 30 tablet 0  . HYDROcodone-acetaminophen (NORCO/VICODIN) 5-325 MG tablet Take 1 tablet by mouth 2 (two) times daily as needed for pain.  0  . ipratropium-albuterol (DUONEB) 0.5-2.5 (3) MG/3ML SOLN Take 3 mLs by nebulization every 6 (six) hours as needed. 360 mL 0  . metoprolol succinate (TOPROL-XL) 25 MG 24 hr tablet Take 0.5 tablets (12.5 mg total) by mouth daily. 60 tablet 0  . mometasone-formoterol (DULERA) 100-5 MCG/ACT AERO Inhale 2 puffs into the lungs 2 (two) times daily. 1 Inhaler 0  . predniSONE (DELTASONE) 10 MG tablet Take 40mg  daily for 3days,Take 30mg  daily for 3days,Take 20mg  daily for 3days,Take 10mg  daily for 3days, then stop. 30 tablet 0  . PROAIR HFA 108 (90 Base) MCG/ACT inhaler Inhale 2 puffs into the lungs 4 (four) times daily as needed for wheezing. 1 Inhaler 0  . tiotropium (SPIRIVA) 18 MCG inhalation capsule Place 1 capsule (18 mcg total) into inhaler and inhale daily. 30 capsule 0   No facility-administered medications prior to visit.     Review of Systems  Constitutional: Negative for chills, fever, malaise/fatigue and weight loss.  HENT: Negative for hearing loss, sore throat and tinnitus.   Eyes: Negative  for blurred vision and double vision.  Respiratory: Positive for cough and shortness of breath. Negative for hemoptysis, sputum production, wheezing and stridor.   Cardiovascular: Negative for chest pain, palpitations, orthopnea, leg swelling and PND.  Gastrointestinal: Negative for abdominal pain, constipation, diarrhea, heartburn, nausea and vomiting.  Genitourinary: Negative for dysuria, hematuria and urgency.  Musculoskeletal: Negative for joint pain and myalgias.  Skin: Negative for itching and rash.  Neurological: Negative for dizziness, tingling, weakness and headaches.  Endo/Heme/Allergies: Negative for environmental allergies. Does not bruise/bleed easily.  Psychiatric/Behavioral: Negative for depression. The patient is not nervous/anxious  and does not have insomnia.   All other systems reviewed and are negative.    Objective:  Physical Exam  Constitutional: She is oriented to person, place, and time. No distress.  Somewhat cachectic, low BMI  HENT:  Head: Normocephalic and atraumatic.  Mouth/Throat: Oropharynx is clear and moist. No oropharyngeal exudate.  Eyes: Pupils are equal, round, and reactive to light. Conjunctivae and EOM are normal.  Neck: No JVD present. No tracheal deviation present.  Loss of supraclavicular fat  Cardiovascular: Normal rate, regular rhythm, S1 normal, S2 normal and intact distal pulses.  Distant heart tones  Pulmonary/Chest: No accessory muscle usage or stridor. No tachypnea. She has decreased breath sounds (throughout all lung fields). She has no wheezes. She has no rhonchi. She has no rales.  Increased AP chest diameter  Abdominal: Soft. Bowel sounds are normal. She exhibits no distension. There is no tenderness.  Musculoskeletal: She exhibits deformity (muscle wasting ). She exhibits no edema.  Neurological: She is alert and oriented to person, place, and time.  Skin: Skin is warm and dry. Capillary refill takes less than 2 seconds. No rash  noted.  Psychiatric: She has a normal mood and affect. Her behavior is normal.  Vitals reviewed.    Vitals:   03/27/18 1557 03/27/18 1558  BP:  108/70  Pulse: 71   SpO2: 97%   Weight:  113 lb 6.4 oz (51.4 kg)  Height:  5\' 7"  (1.702 m)   97% on 3 L pulse POC BMI Readings from Last 3 Encounters:  03/05/18 17.09 kg/m  03/02/18 17.04 kg/m  02/04/18 16.37 kg/m   Wt Readings from Last 3 Encounters:  03/05/18 109 lb 2 oz (49.5 kg)  03/02/18 108 lb 12.8 oz (49.4 kg)  02/04/18 104 lb 8 oz (47.4 kg)     CBC    Component Value Date/Time   WBC 5.7 03/05/2018 0327   RBC 4.61 03/05/2018 0327   HGB 14.0 03/05/2018 0327   HCT 44.4 03/05/2018 0327   PLT 228 03/05/2018 0327   MCV 96.3 03/05/2018 0327   MCH 30.4 03/05/2018 0327   MCHC 31.5 03/05/2018 0327   RDW 12.9 03/05/2018 0327   LYMPHSABS 2.3 03/04/2018 2146   MONOABS 0.7 03/04/2018 2146   EOSABS 0.2 03/04/2018 2146   BASOSABS 0.1 03/04/2018 2146    Chest Imaging: 03/04/2018 chest x-ray: Emphysematous lung changes The patient's images have been independently reviewed by me.    Pulmonary Functions Testing Results: No flowsheet data found.  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 2. Normal left ventricular filling pressure. 3. Low normal left ventricular filling pressure with subtle apical hypokinesis (LVEF 50-55%).    Assessment & Plan:   Chronic obstructive pulmonary disease, unspecified COPD type (HCC) - Plan: Pulmonary function test, 6 minute walk  SOB (shortness of breath) - Plan: Pulmonary function test, 6 minute walk  Tobacco abuse  Chronic hypoxemic respiratory failure (HCC)  Discussion:  This is a 67 year old female with a history of long-term tobacco abuse since age 19, quit prior to  hospitalization in October.  Without having a recent 6-minute walk test and based on current mMRC of 3/4.  Her current bode index based on a BMI of less than 21, current BMI 17.8, her bode index would be an estimate of 7-8 points.  Which would suggest a high probability  of death related to COPD within the next 4 years.  Patient was counseled on smoking abstinence.  She needs to continue her current oxygen therapy.  She can continue current inhaler regimen to include Dulera and Spiriva.  She does feel like these are working for her.  She does like the Spiriva HandiHaler.  She has tried the Spiriva Respimat in the past and prefers to HandiHaler.  We will give her refills of her albuterol nebs as well as albuterol HFA.  Additionally patient should start an over-the-counter vitamin D supplement.  2000 units daily  Recommend full PFTs with 6-minute walk test.  Once these are completed I believe she will have benefit with referral to pulmonary rehab.  Patient should qualify for pulmonary rehab pending PFTs if not may be better suited for cardiopulmonary rehab as her most recent echo with a depressed ejection fraction.  Can discuss syndrome and lung cancer screening CT program at next office visit.  Per patient's current physical condition related to her COPD.  This may preclude her from lung cancer screening.  Return to clinic in 3 months.  Or, as needed if symptoms worsen.  Greater than 50% of this patient's 60-minute office visit was spent discussing the above treatment approach, abstinence from cigarette smoking, importance of maintaining weight, severity of current illness, prognosis of advanced COPD and importance of enrollment in pulmonary rehabilitation.   Current Outpatient Medications:  .  ALPRAZolam (XANAX) 0.25 MG tablet, Take 1 tablet by mouth 2 (two) times daily., Disp: , Rfl: 0 .  aspirin EC 81 MG EC tablet, Take 1 tablet (81 mg total) by mouth daily., Disp: 30 tablet, Rfl: 0 .   atorvastatin (LIPITOR) 40 MG tablet, Take 1 tablet (40 mg total) by mouth daily at 6 PM., Disp: 30 tablet, Rfl: 0 .  feeding supplement, ENSURE ENLIVE, (ENSURE ENLIVE) LIQD, Take 237 mLs by mouth 2 (two) times daily between meals., Disp: 60 Bottle, Rfl: 0 .  guaiFENesin (MUCINEX) 600 MG 12 hr tablet, Take 1 tablet (600 mg total) by mouth 2 (two) times daily., Disp: 30 tablet, Rfl: 0 .  HYDROcodone-acetaminophen (NORCO/VICODIN) 5-325 MG tablet, Take 1 tablet by mouth 2 (two) times daily as needed for pain., Disp: , Rfl: 0 .  ipratropium-albuterol (DUONEB) 0.5-2.5 (3) MG/3ML SOLN, Take 3 mLs by nebulization every 6 (six) hours as needed., Disp: 360 mL, Rfl: 0 .  metoprolol succinate (TOPROL-XL) 25 MG 24 hr tablet, Take 0.5 tablets (12.5 mg total) by mouth daily., Disp: 60 tablet, Rfl: 0 .  mometasone-formoterol (DULERA) 100-5 MCG/ACT AERO, Inhale 2 puffs into the lungs 2 (two) times daily., Disp: 1 Inhaler, Rfl: 0 .  predniSONE (DELTASONE) 10 MG tablet, Take 40mg  daily for 3days,Take 30mg  daily for 3days,Take 20mg  daily for 3days,Take 10mg  daily for 3days, then stop., Disp: 30 tablet, Rfl: 0 .  PROAIR HFA 108 (90 Base) MCG/ACT inhaler, Inhale 2 puffs into the lungs 4 (four) times daily as needed for wheezing., Disp: 1 Inhaler, Rfl: 0 .  tiotropium (SPIRIVA) 18 MCG inhalation capsule, Place 1 capsule (18 mcg total) into inhaler and inhale daily., Disp: 30 capsule, Rfl: 0   [name], DO [facility] 03/27/2018 8:50 AM

## 2018-03-27 NOTE — Patient Instructions (Addendum)
Thank you for visiting Dr. Tonia Brooms at [facility]. Today we recommend the following: Orders Placed This Encounter  Procedures  . Pulmonary function test  . 6 minute walk   PFT as soon as possible.   We are moving our office in November. The new address will be: [address] [employer] [address] Phone: [phone]

## 2018-03-30 ENCOUNTER — Ambulatory Visit (INDEPENDENT_AMBULATORY_CARE_PROVIDER_SITE_OTHER): Payer: Medicare Other | Admitting: Pulmonary Disease

## 2018-03-30 DIAGNOSIS — J449 Chronic obstructive pulmonary disease, unspecified: Secondary | ICD-10-CM | POA: Diagnosis not present

## 2018-03-30 DIAGNOSIS — R0602 Shortness of breath: Secondary | ICD-10-CM

## 2018-03-30 LAB — PULMONARY FUNCTION TEST
DL/VA % PRED: 59 %
DL/VA: 2.09 ml/min/mmHg/L
DLCO UNC % PRED: 78 %
DLCO UNC: 10.54 ml/min/mmHg
FEF 25-75 POST: 0.57 L/s
FEF 25-75 PRE: 0.4 L/s
FEF2575-%Change-Post: 41 %
FEF2575-%PRED-POST: 37 %
FEF2575-%Pred-Pre: 26 %
FEV1-%Change-Post: 26 %
FEV1-%Pred-Post: 69 %
FEV1-%Pred-Pre: 55 %
FEV1-POST: 1.1 L
FEV1-Pre: 0.87 L
FEV1FVC-%Change-Post: 15 %
FEV1FVC-%PRED-PRE: 54 %
FEV6-%CHANGE-POST: 9 %
FEV6-%PRED-POST: 113 %
FEV6-%Pred-Pre: 102 %
FEV6-Post: 2.27 L
FEV6-Pre: 2.07 L
FEV6FVC-%CHANGE-POST: 0 %
FEV6FVC-%PRED-POST: 103 %
FEV6FVC-%Pred-Pre: 102 %
FVC-%Change-Post: 9 %
FVC-%PRED-POST: 109 %
FVC-%Pred-Pre: 100 %
FVC-Post: 2.31 L
FVC-Pre: 2.11 L
POST FEV1/FVC RATIO: 48 %
Post FEV6/FVC ratio: 98 %
Pre FEV1/FVC ratio: 41 %
Pre FEV6/FVC Ratio: 98 %
RV % pred: 341 %
RV: 6.04 L
TLC % PRED: 213 %
TLC: 8.24 L

## 2018-03-30 NOTE — Progress Notes (Signed)
PFT completed today.  

## 2018-04-05 ENCOUNTER — Telehealth: Payer: Self-pay

## 2018-04-05 DIAGNOSIS — J449 Chronic obstructive pulmonary disease, unspecified: Secondary | ICD-10-CM

## 2018-04-05 NOTE — Telephone Encounter (Signed)
Order for pulm rehab placed. Nothing further is needed at this time.

## 2018-04-06 ENCOUNTER — Telehealth (HOSPITAL_COMMUNITY): Payer: Self-pay

## 2018-04-06 NOTE — Telephone Encounter (Signed)
Pt's insurance is active and [facility]  $20.00 Co-pay, deductible amount of $0.00/$0.00, out of pocket amount $6,700/$0.00, no co-insurance, and no pre-authorization is required. [facility] [id number]

## 2018-04-06 NOTE — Telephone Encounter (Signed)
Attempted to contact patient in regards to PR - lm on vm °

## 2018-04-12 ENCOUNTER — Telehealth (HOSPITAL_COMMUNITY): Payer: Self-pay

## 2018-04-12 NOTE — Telephone Encounter (Signed)
2nd attempt to contact patient in regards to PR - lm on vm. Sending letter.

## 2018-04-17 ENCOUNTER — Telehealth (HOSPITAL_COMMUNITY): Payer: Self-pay

## 2018-04-17 NOTE — Telephone Encounter (Signed)
3rd attempt to contact pt in regards to PR, LMTCB

## 2018-04-24 LAB — LIPID PANEL
CHOL/HDL RATIO: 1.9 ratio (ref 0.0–4.4)
Cholesterol, Total: 143 mg/dL (ref 100–199)
HDL: 77 mg/dL (ref >39)
LDL Calculated: 52 mg/dL (ref 0–99)
TRIGLYCERIDES: 70 mg/dL (ref 0–149)
VLDL CHOLESTEROL CAL: 14 mg/dL (ref 5–40)

## 2018-04-24 LAB — HEPATIC FUNCTION PANEL
ALT: 185 IU/L — AB (ref 0–32)
AST: 97 IU/L — ABNORMAL HIGH (ref 0–40)
Albumin: 4.3 g/dL (ref 3.6–4.8)
Alkaline Phosphatase: 177 IU/L — ABNORMAL HIGH (ref 39–117)
BILIRUBIN, DIRECT: 0.14 mg/dL (ref 0.00–0.40)
Bilirubin Total: 0.5 mg/dL (ref 0.0–1.2)
TOTAL PROTEIN: 6.4 g/dL (ref 6.0–8.5)

## 2018-04-30 ENCOUNTER — Telehealth: Payer: Self-pay

## 2018-04-30 DIAGNOSIS — I2510 Atherosclerotic heart disease of native coronary artery without angina pectoris: Secondary | ICD-10-CM

## 2018-04-30 DIAGNOSIS — E785 Hyperlipidemia, unspecified: Secondary | ICD-10-CM

## 2018-04-30 NOTE — Telephone Encounter (Signed)
-----   Message from [place]ao M[place], [place] sent at 04/27/2018 12:28 PM EST ----- Cholesterol very well controlled, but liver enzyme is elevated, recommend stop lipitor, repeat Liver function test in 2 weeks.

## 2018-05-01 ENCOUNTER — Other Ambulatory Visit: Payer: Self-pay

## 2018-05-01 MED ORDER — MOMETASONE FURO-FORMOTEROL FUM 100-5 MCG/ACT IN AERO: 2.0000 | INHALATION_SPRAY | Freq: Two times a day (BID) | RESPIRATORY_TRACT | 5 refills | Status: DC

## 2018-05-01 NOTE — Telephone Encounter (Signed)
No response from pt in regards to PR.  Closed referral 

## 2018-05-02 ENCOUNTER — Other Ambulatory Visit (HOSPITAL_COMMUNITY): Payer: Medicare Other

## 2018-05-14 ENCOUNTER — Other Ambulatory Visit: Payer: Self-pay

## 2018-05-14 DIAGNOSIS — I5021 Acute systolic (congestive) heart failure: Secondary | ICD-10-CM

## 2018-05-14 DIAGNOSIS — I2510 Atherosclerotic heart disease of native coronary artery without angina pectoris: Secondary | ICD-10-CM

## 2018-05-14 LAB — HEPATIC FUNCTION PANEL
ALBUMIN: 4.1 g/dL (ref 3.6–4.8)
ALK PHOS: 113 IU/L (ref 39–117)
ALT: 28 IU/L (ref 0–32)
AST: 18 IU/L (ref 0–40)
BILIRUBIN TOTAL: 0.6 mg/dL (ref 0.0–1.2)
BILIRUBIN, DIRECT: 0.16 mg/dL (ref 0.00–0.40)
Total Protein: 5.8 g/dL — ABNORMAL LOW (ref 6.0–8.5)

## 2018-05-14 NOTE — Progress Notes (Signed)
Hepatic  

## 2018-05-15 ENCOUNTER — Encounter: Payer: Self-pay | Admitting: *Deleted

## 2018-05-15 NOTE — Progress Notes (Signed)
Liver function normalized. 

## 2018-05-30 ENCOUNTER — Other Ambulatory Visit: Payer: Self-pay

## 2018-05-30 ENCOUNTER — Ambulatory Visit (HOSPITAL_COMMUNITY): Payer: Medicare Other | Attending: Cardiology

## 2018-05-30 ENCOUNTER — Ambulatory Visit: Payer: Medicare Other | Admitting: Pulmonary Disease

## 2018-05-30 ENCOUNTER — Ambulatory Visit: Payer: Medicare Other

## 2018-05-30 DIAGNOSIS — I5181 Takotsubo syndrome: Secondary | ICD-10-CM | POA: Diagnosis not present

## 2018-05-30 DIAGNOSIS — E785 Hyperlipidemia, unspecified: Secondary | ICD-10-CM | POA: Diagnosis present

## 2018-05-30 DIAGNOSIS — F419 Anxiety disorder, unspecified: Secondary | ICD-10-CM | POA: Insufficient documentation

## 2018-05-30 DIAGNOSIS — J449 Chronic obstructive pulmonary disease, unspecified: Secondary | ICD-10-CM | POA: Insufficient documentation

## 2018-05-30 DIAGNOSIS — F17201 Nicotine dependence, unspecified, in remission: Secondary | ICD-10-CM | POA: Insufficient documentation

## 2018-05-31 NOTE — Progress Notes (Signed)
Normal pumping function of heart, moderate leakage in mitral valve which does not need to be fixed.

## 2018-06-06 ENCOUNTER — Encounter: Payer: Self-pay | Admitting: Pulmonary Disease

## 2018-06-06 ENCOUNTER — Ambulatory Visit (INDEPENDENT_AMBULATORY_CARE_PROVIDER_SITE_OTHER): Payer: Medicare Other | Admitting: *Deleted

## 2018-06-06 ENCOUNTER — Ambulatory Visit (INDEPENDENT_AMBULATORY_CARE_PROVIDER_SITE_OTHER): Payer: Medicare Other | Admitting: Pulmonary Disease

## 2018-06-06 VITALS — BP 122/58 | HR 96 | Ht 68.0 in | Wt 122.6 lb

## 2018-06-06 DIAGNOSIS — J449 Chronic obstructive pulmonary disease, unspecified: Secondary | ICD-10-CM

## 2018-06-06 DIAGNOSIS — R0602 Shortness of breath: Secondary | ICD-10-CM | POA: Diagnosis not present

## 2018-06-06 DIAGNOSIS — Z720 Tobacco use: Secondary | ICD-10-CM | POA: Diagnosis not present

## 2018-06-06 DIAGNOSIS — J9611 Chronic respiratory failure with hypoxia: Secondary | ICD-10-CM | POA: Diagnosis not present

## 2018-06-06 DIAGNOSIS — R062 Wheezing: Secondary | ICD-10-CM | POA: Diagnosis not present

## 2018-06-06 MED ORDER — BUDESONIDE-FORMOTEROL FUMARATE 160-4.5 MCG/ACT IN AERO: 2.0000 | INHALATION_SPRAY | Freq: Two times a day (BID) | RESPIRATORY_TRACT | 0 refills | Status: DC

## 2018-06-06 MED ORDER — BENZONATATE 100 MG PO CAPS: 100.0000 mg | ORAL_CAPSULE | Freq: Three times a day (TID) | ORAL | 0 refills | Status: DC | PRN

## 2018-06-06 MED ORDER — AEROCHAMBER MV MISC: 0 refills | Status: AC

## 2018-06-06 MED ORDER — PREDNISONE 20 MG PO TABS: 40.0000 mg | ORAL_TABLET | Freq: Every day | ORAL | 0 refills | Status: AC

## 2018-06-06 MED ORDER — TIOTROPIUM BROMIDE MONOHYDRATE 2.5 MCG/ACT IN AERS: 2.0000 | INHALATION_SPRAY | Freq: Every day | RESPIRATORY_TRACT | 0 refills | Status: DC

## 2018-06-06 NOTE — Patient Instructions (Addendum)
Thank you for visiting Dr. Tonia Brooms at [facility]. Today we recommend the following:  Orders Placed This Encounter  Procedures  . DG Bone Density   Meds ordered this encounter  Medications  . predniSONE (DELTASONE) 20 MG tablet    Sig: Take 2 tablets (40 mg total) by mouth daily with breakfast for 5 days.    Dispense:  10 tablet    Refill:  0  . budesonide-formoterol (SYMBICORT) 160-4.5 MCG/ACT inhaler    Sig: Inhale 2 puffs into the lungs every 12 (twelve) hours.    Dispense:  1 Inhaler    Refill:  0    Order Specific Question:   Lot Number?    Answer:   [id number]    Order Specific Question:   Expiration Date?    Answer:   06/07/2019    Order Specific Question:   Manufacturer?    Answer:   [employer] [[id number]]    Order Specific Question:   Quantity    Answer:   1  . Tiotropium Bromide Monohydrate (SPIRIVA RESPIMAT) 2.5 MCG/ACT AERS    Sig: Inhale 2 puffs into the lungs daily.    Dispense:  1 Inhaler    Refill:  0    Order Specific Question:   Lot Number?    Answer:   [id number]    Order Specific Question:   Expiration Date?    Answer:   07/07/2020    Order Specific Question:   Quantity    Answer:   1    FOLLOW UP APPOINTMENTS:  Please schedule a follow up appointment with Kandice Robinsons, NP for enrollment into the [facility].   Return in about 2 weeks (around 06/20/2018). to see NP to make sure you are getting better and like new inhaler regimen.   ROV to see me in 3 months.

## 2018-06-06 NOTE — Addendum Note (Signed)
Addended by: Kerin Ransom on: 06/06/2018 05:16 PM   Modules accepted: Orders

## 2018-06-06 NOTE — Progress Notes (Signed)
SIX MIN WALK 06/06/2018  Medications Proair at 3pm, Aspirin 81mg  at 7:30, Vitamin D 2000 units, metoprolol 12.5mg , Dulera 100, Spiriva at 10am  Supplimental Oxygen during Test? (L/min) Yes  O2 Flow Rate 3  Type Continuous  Laps 9  Partial Lap (in Meters) 0  Baseline BP (sitting) 114/70  Baseline Heartrate 91  Baseline Dyspnea (Borg Scale) 3  Baseline Fatigue (Borg Scale) 3  Baseline SPO2 98  BP (sitting) 130/80  Heartrate 115  Dyspnea (Borg Scale) 9  Fatigue (Borg Scale) 4  SPO2 95  BP (sitting) 122/70  Heartrate 101  SPO2 98  Stopped or Paused before Six Minutes No  Distance Completed 306  Tech Comments: Pt walked at a normal pace without stopping during the walk. Pt denied any complaints.

## 2018-06-06 NOTE — Progress Notes (Signed)
Synopsis: Referred in November 2019 for COPD by Aida PufferLittle, James, MD  Subjective:   PATIENT ID: Vickie GillesSylvia J Bowman GENDER: female DOB: [birth year], MRN: [id number]  Chief Complaint  Patient presents with  . Follow-up    6MW today. States she has been congested for 1 week. Dry cough. States she has been more SOB. States she feels congestino in her chest. She would like a refill of Tessalon.     PMH of CHF, tobacco abuse, quit smoking Aug 2019. Smoked since age 68 years.  Patient was admitted to the hospital in September 2019 as well as in October 2019 for COPD exacerbations.  The patient's most recent hospitalization for COPD she developed hypoxemic respiratory failure requiring nasal cannula supplementation.  Initial echocardiogram revealed a reduced ejection fraction and she was subsequently taken for a left heart cath in September 2019 which revealed nonobstructive CAD.  The patient was treated in the emergency room with CPAP, steroids, magnesium, albuterol.  Hospital course was relatively uncomplicated and the patient was discharged home on oxygen therapy.  Since her hospitalization she has been overall been doing well.  She does have significant dyspnea on exertion.  She feels like she has slowly been recovering.  She does acknowledge that she has been able to quit smoking.  She is currently on 3 L pulse from a POC.  She states that she has been trying to eat.  She does know that her BMI is low.  Patient denies chest pain, hemoptysis, daily sputum production.  OV 06/06/2018: She feels like she is a little congested. This has been getting worse throughout the past week. Cough is much worse. Not really bringing anything up at this time. She trys but it feels stuck.  Patient denies any fevers.  She has woke up in the middle the night with ongoing cough for the past several nights.  She does get up in the middle of the night and feel like her chest is more tight.  She has been using her albuterol more over  the past couple of days.  Does not have any significant sputum production.    Past Medical History:  Diagnosis Date  . Anxiety   . CHF (congestive heart failure) (HCC)   . COPD (chronic obstructive pulmonary disease) (HCC)   . Lumbar herniated disc   . Tobacco abuse      Family History  Problem Relation Age of Onset  . Liver cancer Mother   . Breast cancer Sister   . CAD Neg Hx      Past Surgical History:  Procedure Laterality Date  . LEFT HEART CATH AND CORONARY ANGIOGRAPHY N/A 02/05/2018   Procedure: LEFT HEART CATH AND CORONARY ANGIOGRAPHY;  Surgeon: Yvonne KendallEnd, Christopher, MD;  Location: [facility];  Service: Cardiovascular;  Laterality: N/A;    Social History   Socioeconomic History  . Marital status: Widowed    Spouse name: Not on file  . Number of children: Not on file  . Years of education: Not on file  . Highest education level: Not on file  Occupational History  . Not on file  Social Needs  . Financial resource strain: Not on file  . Food insecurity:    Worry: Not on file    Inability: Not on file  . Transportation needs:    Medical: Not on file    Non-medical: Not on file  Tobacco Use  . Smoking status: Former Smoker    Last attempt to quit: 01/07/2018  Years since quitting: 0.4  . Smokeless tobacco: Never Used  . Tobacco comment: Smoked since age 82, quit 12/2017  Substance and Sexual Activity  . Alcohol use: Yes    Comment: Rare, glass of wine no more than every few weeks  . Drug use: Never  . Sexual activity: Not Currently  Lifestyle  . Physical activity:    Days per week: Not on file    Minutes per session: Not on file  . Stress: Not on file  Relationships  . Social connections:    Talks on phone: Not on file    Gets together: Not on file    Attends religious service: Not on file    Active member of club or organization: Not on file    Attends meetings of clubs or organizations: Not on file    Relationship status: Not on file  .  Intimate partner violence:    Fear of current or ex partner: Not on file    Emotionally abused: Not on file    Physically abused: Not on file    Forced sexual activity: Not on file  Other Topics Concern  . Not on file  Social History Narrative  . Not on file     Allergies  Allergen Reactions  . Penicillins Swelling    Has patient had a PCN reaction causing immediate rash, facial/tongue/throat swelling, SOB or lightheadedness with hypotension: Yes Has patient had a PCN reaction causing severe rash involving mucus membranes or skin necrosis: No Has patient had a PCN reaction that required hospitalization: No Has patient had a PCN reaction occurring within the last 10 years: No If all of the above answers are "NO", then may proceed with Cephalosporin use.      Outpatient Medications Prior to Visit  Medication Sig Dispense Refill  . ALPRAZolam (XANAX) 0.25 MG tablet Take 1 tablet by mouth 2 (two) times daily.  0  . aspirin EC 81 MG EC tablet Take 1 tablet (81 mg total) by mouth daily. 30 tablet 0  . atorvastatin (LIPITOR) 40 MG tablet Take 1 tablet (40 mg total) by mouth daily at 6 PM. 30 tablet 0  . benzonatate (TESSALON) 100 MG capsule Take 100 mg by mouth 3 (three) times daily as needed for cough.    . Cholecalciferol (VITAMIN D) 2000 units CAPS Take 1 capsule (2,000 Units total) by mouth daily. 30 capsule 5  . feeding supplement, ENSURE ENLIVE, (ENSURE ENLIVE) LIQD Take 237 mLs by mouth 2 (two) times daily between meals. 60 Bottle 0  . guaiFENesin (MUCINEX) 600 MG 12 hr tablet Take 1 tablet (600 mg total) by mouth 2 (two) times daily. 30 tablet 0  . HYDROcodone-acetaminophen (NORCO/VICODIN) 5-325 MG tablet Take 1 tablet by mouth 2 (two) times daily as needed for pain.  0  . ipratropium-albuterol (DUONEB) 0.5-2.5 (3) MG/3ML SOLN Take 3 mLs by nebulization every 6 (six) hours as needed. 360 mL 0  . metoprolol succinate (TOPROL-XL) 25 MG 24 hr tablet Take 0.5 tablets (12.5 mg total) by  mouth daily. 60 tablet 0  . mometasone-formoterol (DULERA) 100-5 MCG/ACT AERO Inhale 2 puffs into the lungs 2 (two) times daily. 1 Inhaler 5  . PROAIR HFA 108 (90 Base) MCG/ACT inhaler Inhale 2 puffs into the lungs 4 (four) times daily as needed for wheezing. 1 Inhaler 5  . tiotropium (SPIRIVA) 18 MCG inhalation capsule Place 1 capsule (18 mcg total) into inhaler and inhale daily. 30 capsule 5   No facility-administered medications prior  to visit.     Review of Systems  Constitutional: Negative for chills, fever, malaise/fatigue and weight loss.  HENT: Negative for hearing loss, sore throat and tinnitus.   Eyes: Negative for blurred vision and double vision.  Respiratory: Positive for cough, shortness of breath and wheezing. Negative for hemoptysis, sputum production and stridor.   Cardiovascular: Negative for chest pain, palpitations, orthopnea, leg swelling and PND.  Gastrointestinal: Negative for abdominal pain, constipation, diarrhea, heartburn, nausea and vomiting.  Genitourinary: Negative for dysuria, hematuria and urgency.  Musculoskeletal: Negative for joint pain and myalgias.  Skin: Negative for itching and rash.  Neurological: Negative for dizziness, tingling, weakness and headaches.  Endo/Heme/Allergies: Negative for environmental allergies. Does not bruise/bleed easily.  Psychiatric/Behavioral: Negative for depression. The patient is not nervous/anxious and does not have insomnia.   All other systems reviewed and are negative.    Objective:  Physical Exam Vitals signs reviewed.  Constitutional:      General: She is not in acute distress.    Appearance: She is well-developed.  HENT:     Head: Normocephalic and atraumatic.     Mouth/Throat:     Pharynx: No oropharyngeal exudate.  Eyes:     Conjunctiva/sclera: Conjunctivae normal.     Pupils: Pupils are equal, round, and reactive to light.  Neck:     Vascular: No JVD.     Trachea: No tracheal deviation.     Comments:  Loss of supraclavicular fat Cardiovascular:     Rate and Rhythm: Normal rate and regular rhythm.     Heart sounds: S1 normal and S2 normal.     Comments: Distant heart tones Pulmonary:     Effort: No tachypnea or accessory muscle usage.     Breath sounds: No stridor. Decreased breath sounds (throughout all lung fields) and wheezing present. No rhonchi or rales.  Abdominal:     General: Bowel sounds are normal. There is no distension.     Palpations: Abdomen is soft.     Tenderness: There is no abdominal tenderness.  Musculoskeletal:        General: Deformity (muscle wasting ) present.  Skin:    General: Skin is warm and dry.     Capillary Refill: Capillary refill takes less than 2 seconds.     Findings: No rash.  Neurological:     Mental Status: She is alert and oriented to person, place, and time.  Psychiatric:        Behavior: Behavior normal.      Vitals:   06/06/18 1607  BP: (!) 122/58  Pulse: 94  SpO2: 98%  Weight: 122 lb 9.6 oz (55.6 kg)  Height: 5\' 8"  (1.727 m)   98% on 3 L pulse POC BMI Readings from Last 3 Encounters:  06/06/18 18.64 kg/m  03/27/18 17.76 kg/m  03/05/18 17.09 kg/m   Wt Readings from Last 3 Encounters:  06/06/18 122 lb 9.6 oz (55.6 kg)  03/27/18 113 lb 6.4 oz (51.4 kg)  03/05/18 109 lb 2 oz (49.5 kg)     CBC    Component Value Date/Time   WBC 5.7 03/05/2018 0327   RBC 4.61 03/05/2018 0327   HGB 14.0 03/05/2018 0327   HCT 44.4 03/05/2018 0327   PLT 228 03/05/2018 0327   MCV 96.3 03/05/2018 0327   MCH 30.4 03/05/2018 0327   MCHC 31.5 03/05/2018 0327   RDW 12.9 03/05/2018 0327   LYMPHSABS 2.3 03/04/2018 2146   MONOABS 0.7 03/04/2018 2146   EOSABS 0.2 03/04/2018 2146  BASOSABS 0.1 03/04/2018 2146    Chest Imaging: 03/04/2018 chest x-ray: Emphysematous lung changes The patient's images have been independently reviewed by me.    Pulmonary Functions Testing Results: PFT Results Latest Ref Rng & Units 03/30/2018  FVC-Pre L 2.11   FVC-Predicted Pre % 100  FVC-Post L 2.31  FVC-Predicted Post % 109  Pre FEV1/FVC % % 41  Post FEV1/FCV % % 48  FEV1-Pre L 0.87  FEV1-Predicted Pre % 55  FEV1-Post L 1.10  DLCO UNC% % 78  DLCO COR %Predicted % 59  TLC L 8.24  TLC % Predicted % 213  RV % Predicted % 341   6MWT: SIX MIN WALK 06/06/2018  Medications Proair at 3pm, Aspirin 81mg  at 7:30, Vitamin D 2000 units, metoprolol 12.5mg , Dulera 100, Spiriva 18mcg at 10am  Supplimental Oxygen during Test? (L/min) Yes  O2 Flow Rate 3  Type Continuous  Laps 9  Partial Lap (in Meters) 0  Baseline BP (sitting) 114/70  Baseline Heartrate 91  Baseline Dyspnea (Borg Scale) 3  Baseline Fatigue (Borg Scale) 3  Baseline SPO2 98  BP (sitting) 130/80  Heartrate 115  Dyspnea (Borg Scale) 9  Fatigue (Borg Scale) 4  SPO2 95  BP (sitting) 122/70  Heartrate 101  SPO2 98  Stopped or Paused before Six Minutes No  Distance Completed 306  Tech Comments: Pt walked at a normal pace without stopping during the walk. Pt denied any complaints.    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 2. Normal left ventricular filling pressure. 3. Low normal left ventricular filling pressure with subtle apical hypokinesis (LVEF 50-55%).    Assessment & Plan:   Moderate COPD (chronic obstructive pulmonary disease) (HCC) - Plan: DG Bone Density  Tobacco abuse - Plan: Ambulatory Referral for Lung Cancer Scre  Chronic hypoxemic respiratory failure (HCC)  Wheezing  Discussion:  This is a [age] year old female with a long-term tobacco abuse history since age 68.  She quit prior to her hospitalization in October.  She does have a low BMI of 18.  She has chronic hypoxemic respiratory failure requiring 2 L nasal cannula.  Currently maintained on  Dulera and Spiriva HandiHaler.  She is using daily vitamin D supplementation.  She does feel little more congested and tight in the chest today.  We will give her 5 days of prednisone 40 mg daily. We will complete a inspiratory flow rate today in the office and see if she can handle use of Trelegy inhaler. Unfortunately her inspiratory flow rate was less than 50 L. Therefore we will make recommendations to change her current inhaler regimen. We will start her on Symbicort 160, 2 puffs twice daily We will start Spiriva Respimat 2.5, 2 puffs once daily She needs to stop her current Dulera and Spiriva HandiHaler. Can continue albuterol as needed for shortness of breath and wheezing. She should continue her vitamin D supplementation. We will also screen her for osteoporosis with a DEXA scan.  She needs to be enrolled in our low-dose lung cancer screening program.  She would like to have a handicap sticker.  I recommended she bring the [facility] from [facility] office and I would be happy to sign this.  Patient to return in 2 weeks to ensure that she is improving with her current cough and congestion and see how she is tolerating her  new inhaler regimen.  She can be seen by one of our nurse practitioners for this.   Patient to follow-up with me in 3 months.   Current Outpatient Medications:  .  ALPRAZolam (XANAX) 0.25 MG tablet, Take 1 tablet by mouth 2 (two) times daily., Disp: , Rfl: 0 .  aspirin EC 81 MG EC tablet, Take 1 tablet (81 mg total) by mouth daily., Disp: 30 tablet, Rfl: 0 .  atorvastatin (LIPITOR) 40 MG tablet, Take 1 tablet (40 mg total) by mouth daily at 6 PM., Disp: 30 tablet, Rfl: 0 .  benzonatate (TESSALON) 100 MG capsule, Take 100 mg by mouth 3 (three) times daily as needed for cough., Disp: , Rfl:  .  Cholecalciferol (VITAMIN D) 2000 units CAPS, Take 1 capsule (2,000 Units total) by mouth daily., Disp: 30 capsule, Rfl: 5 .  feeding supplement, ENSURE ENLIVE, (ENSURE ENLIVE)  LIQD, Take 237 mLs by mouth 2 (two) times daily between meals., Disp: 60 Bottle, Rfl: 0 .  guaiFENesin (MUCINEX) 600 MG 12 hr tablet, Take 1 tablet (600 mg total) by mouth 2 (two) times daily., Disp: 30 tablet, Rfl: 0 .  HYDROcodone-acetaminophen (NORCO/VICODIN) 5-325 MG tablet, Take 1 tablet by mouth 2 (two) times daily as needed for pain., Disp: , Rfl: 0 .  ipratropium-albuterol (DUONEB) 0.5-2.5 (3) MG/3ML SOLN, Take 3 mLs by nebulization every 6 (six) hours as needed., Disp: 360 mL, Rfl: 0 .  metoprolol succinate (TOPROL-XL) 25 MG 24 hr tablet, Take 0.5 tablets (12.5 mg total) by mouth daily., Disp: 60 tablet, Rfl: 0 .  mometasone-formoterol (DULERA) 100-5 MCG/ACT AERO, Inhale 2 puffs into the lungs 2 (two) times daily., Disp: 1 Inhaler, Rfl: 5 .  PROAIR HFA 108 (90 Base) MCG/ACT inhaler, Inhale 2 puffs into the lungs 4 (four) times daily as needed for wheezing., Disp: 1 Inhaler, Rfl: 5 .  tiotropium (SPIRIVA) 18 MCG inhalation capsule, Place 1 capsule (18 mcg total) into inhaler and inhale daily., Disp: 30 capsule, Rfl: 5   [name], DO [facility] 06/06/2018 4:29 PM

## 2018-06-07 ENCOUNTER — Ambulatory Visit (INDEPENDENT_AMBULATORY_CARE_PROVIDER_SITE_OTHER): Payer: Medicare Other | Admitting: Internal Medicine

## 2018-06-07 ENCOUNTER — Encounter: Payer: Self-pay | Admitting: Internal Medicine

## 2018-06-07 ENCOUNTER — Telehealth: Payer: Self-pay | Admitting: Pulmonary Disease

## 2018-06-07 VITALS — BP 118/62 | HR 86 | Ht 68.0 in | Wt 123.8 lb

## 2018-06-07 DIAGNOSIS — I2510 Atherosclerotic heart disease of native coronary artery without angina pectoris: Secondary | ICD-10-CM

## 2018-06-07 DIAGNOSIS — I5021 Acute systolic (congestive) heart failure: Secondary | ICD-10-CM

## 2018-06-07 DIAGNOSIS — J449 Chronic obstructive pulmonary disease, unspecified: Secondary | ICD-10-CM

## 2018-06-07 DIAGNOSIS — E785 Hyperlipidemia, unspecified: Secondary | ICD-10-CM | POA: Diagnosis not present

## 2018-06-07 NOTE — Progress Notes (Signed)
OFFICE NOTE  Chief Complaint:  Hospital follow-up  Primary Care Physician: Aida PufferLittle, James, MD  HPI:  Vickie Bowman is a 68 y.o. female with a past medial history significant for hospitalization in September for acute dyspnea.  She was admitted for COPD exacerbation was found to have elevated troponin of 1.55.  Cardiology was consulted and echo showed an EF 35 to 40%.  I recommended heart catheterization for her.  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  LVEF at the time was up to 50 to 55%.  Was subsequently seen by Azalee CourseHao Meng, PA-C, and follow-up and a limited echo was ordered.  She was noted to be on atorvastatin 40 mg daily.  A fasting lipid profile LFTs were also ordered.  Recently the labs resulted and she did have marked improvement in her lipid profile with a total cholesterol 143 and LDL of 52, decreased from 161129.  Unfortunately, she had an acute rise in AST and ALT to 97 and 185 respectively from 32 and 25.  This suggest there may be underlying steatohepatitis.  She remains asymptomatic with respect to that.  She is followed in pulmonary has an upcoming appointment in 2 weeks.  She is noted to be on oxygen.  Otherwise besides shortness of breath denies any chest pain.  PMHx:  Past Medical History:  Diagnosis Date  . Anxiety   . CHF (congestive heart failure) (HCC)   . COPD (chronic obstructive pulmonary disease) (HCC)   . Lumbar herniated disc   . Tobacco abuse     Past Surgical History:  Procedure Laterality Date  . LEFT HEART CATH AND CORONARY ANGIOGRAPHY N/A 02/05/2018   Procedure: LEFT HEART CATH AND CORONARY ANGIOGRAPHY;  Surgeon: Yvonne KendallEnd, Christopher, MD;  Location: [facility];  Service: Cardiovascular;  Laterality: N/A;    FAMHx:  Family History  Problem Relation Age of Onset  . Liver cancer Mother   . Breast cancer Sister   . CAD Neg Hx     SOCHx:   reports that she quit smoking about 4  months ago. She has never used smokeless tobacco. She reports current alcohol use. She reports that she does not use drugs.  ALLERGIES:  Allergies  Allergen Reactions  . Penicillins Swelling    Has patient had a PCN reaction causing immediate rash, facial/tongue/throat swelling, SOB or lightheadedness with hypotension: Yes Has patient had a PCN reaction causing severe rash involving mucus membranes or skin necrosis: No Has patient had a PCN reaction that required hospitalization: No Has patient had a PCN reaction occurring within the last 10 years: No If all of the above answers are "NO", then may proceed with Cephalosporin use.     ROS: Pertinent items noted in HPI and remainder of comprehensive ROS otherwise negative.  HOME MEDS: Current Outpatient Medications on File Prior to Visit  Medication Sig Dispense Refill  . ALPRAZolam (XANAX) 0.25 MG tablet Take 1 tablet by mouth 2 (two) times daily.  0  . aspirin EC 81 MG EC tablet Take 1 tablet (81 mg total) by mouth daily. 30 tablet 0  . benzonatate (TESSALON) 100 MG capsule Take 1 capsule (100 mg total) by mouth 3 (three) times daily as needed for cough. 60 capsule 0  . budesonide-formoterol (SYMBICORT) 160-4.5 MCG/ACT inhaler Inhale 2 puffs into the lungs every 12 (twelve) hours. 1 Inhaler 0  . Cholecalciferol (VITAMIN D) 2000 units CAPS Take 1 capsule (2,000  Units total) by mouth daily. 30 capsule 5  . feeding supplement, ENSURE ENLIVE, (ENSURE ENLIVE) LIQD Take 237 mLs by mouth 2 (two) times daily between meals. 60 Bottle 0  . guaiFENesin (MUCINEX) 600 MG 12 hr tablet Take 1 tablet (600 mg total) by mouth 2 (two) times daily. 30 tablet 0  . HYDROcodone-acetaminophen (NORCO/VICODIN) 5-325 MG tablet Take 1 tablet by mouth 2 (two) times daily as needed for pain.  0  . ipratropium-albuterol (DUONEB) 0.5-2.5 (3) MG/3ML SOLN Take 3 mLs by nebulization every 6 (six) hours as needed. 360 mL 0  . metoprolol succinate (TOPROL-XL) 25 MG 24 hr  tablet Take 0.5 tablets (12.5 mg total) by mouth daily. 60 tablet 0  . mometasone-formoterol (DULERA) 100-5 MCG/ACT AERO Inhale 2 puffs into the lungs 2 (two) times daily. 1 Inhaler 5  . predniSONE (DELTASONE) 20 MG tablet Take 2 tablets (40 mg total) by mouth daily with breakfast for 5 days. 10 tablet 0  . PROAIR HFA 108 (90 Base) MCG/ACT inhaler Inhale 2 puffs into the lungs 4 (four) times daily as needed for wheezing. 1 Inhaler 5  . Spacer/Aero-Holding Chambers (AEROCHAMBER MV) inhaler Use as instructed 1 each 0  . tiotropium (SPIRIVA) 18 MCG inhalation capsule Place 1 capsule (18 mcg total) into inhaler and inhale daily. 30 capsule 5  . Tiotropium Bromide Monohydrate (SPIRIVA RESPIMAT) 2.5 MCG/ACT AERS Inhale 2 puffs into the lungs daily. 1 Inhaler 0   No current facility-administered medications on file prior to visit.     LABS/IMAGING: No results found for this or any previous visit (from the past 48 hour(s)). No results found.  LIPID PANEL:    Component Value Date/Time   CHOL 143 04/24/2018 1010   TRIG 70 04/24/2018 1010   HDL 77 04/24/2018 1010   CHOLHDL 1.9 04/24/2018 1010   CHOLHDL 3.0 02/02/2018 0624   VLDL 9 02/02/2018 0624   LDLCALC 52 04/24/2018 1010     WEIGHTS: Wt Readings from Last 3 Encounters:  06/07/18 123 lb 12.8 oz (56.2 kg)  06/06/18 122 lb 9.6 oz (55.6 kg)  03/27/18 113 lb 6.4 oz (51.4 kg)    VITALS: BP 118/62   Pulse 86   Ht 5\' 8"  (1.727 m)   Wt 123 lb 12.8 oz (56.2 kg)   BMI 18.82 kg/m   EXAM: General appearance: alert, no distress and On oxygen Neck: no carotid bruit, no JVD and thyroid not enlarged, symmetric, no tenderness/mass/nodules Lungs: diminished breath sounds bilaterally and rhonchi bilaterally Heart: regular rate and rhythm, S1, S2 normal, no murmur, click, rub or gallop Abdomen: soft, non-tender; bowel sounds normal; no masses,  no organomegaly and Scaphoid Extremities: extremities normal, atraumatic, no cyanosis or edema Pulses:  2+ and symmetric Skin: Skin color, texture, turgor normal. No rashes or lesions Neurologic: Grossly normal : Pleasant  EKG: Normal sinus rhythm at 86, possible left atrial enlargement- personally reviewed  ASSESSMENT: 1. Recent acute systolic congestive heart failure, LVEF 35 to 40%-improved to 55 to 60% (05/2018) 2. Mild nonobstructive coronary disease (01/2018) 3. COPD on oxygen  4. Elevated LFTs on statin  PLAN: 1.   Vickie Bowman has had improvement of LV function to normal.  She appears euvolemic on exam.  She is struggling mostly with COPD.  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  She only had mild nonobstructive coronary disease on cath.  She did have elevated LFTs on statin, suspicious for underlying steatohepatitis.  For the time being as her lipid profile was not  significantly abnormal and she had minimal coronary disease, we will keep her off of statin therapy.  Follow-up in 1 year.  Chrystie Nose, MD, [facility], FACP  [place]  [facility]  Medical Director of the Advanced Lipid Disorders &  Cardiovascular Risk Reduction Clinic Diplomate of the American Board of Clinical Lipidology Attending Cardiologist  Direct Dial: [phone]  Fax: [phone]  Website:  [email or web address]  06/07/2018, 5:02 PM

## 2018-06-07 NOTE — Patient Instructions (Signed)
Medication Instructions:  OK to remain off atorvastatin If you need a refill on your cardiac medications before your next appointment, please call your pharmacy.   Follow-Up: At [facility], you and your health needs are our priority.  As part of our continuing mission to provide you with exceptional heart care, we have created designated Provider Care Teams.  These Care Teams include your primary Cardiologist (physician) and Advanced Practice Providers (APPs -  Physician Assistants and Nurse Practitioners) who all work together to provide you with the care you need, when you need it. You will need a follow up appointment in 12 months.  Please call our office 2 months in advance to schedule this appointment.  You may see Chrystie Nose, MD or one of the following Advanced Practice Providers on your designated Care Team: [employer], [place] . Micah Flesher, PA-C  Any Other Special Instructions Will Be Listed Below (If Applicable).

## 2018-06-07 NOTE — Telephone Encounter (Signed)
Called and spoke with Patient.  Patient was unsure about Symbicort and Spiriva instructions.  Symbicort 2 puffs twice a day (12 hours apart), and Spiriva 2 puffs daily, instructions given.  Patient stated understanding.  Nothing further at this time.  Per Dr. Tonia Brooms 06/06/18, AVS-   Tiotropium Bromide Monohydrate (SPIRIVA RESPIMAT) 2.5 MCG/ACT AERS     Sig: Inhale 2 puffs into the lungs daily   budesonide-formoterol (SYMBICORT) 160-4.5 MCG/ACT inhaler     Sig: Inhale 2 puffs into the lungs every 12 (twelve) hours

## 2018-06-21 ENCOUNTER — Ambulatory Visit (INDEPENDENT_AMBULATORY_CARE_PROVIDER_SITE_OTHER): Payer: Medicare Other | Admitting: Pulmonary Disease

## 2018-06-21 ENCOUNTER — Encounter: Payer: Self-pay | Admitting: Pulmonary Disease

## 2018-06-21 VITALS — BP 100/60 | HR 74 | Ht 67.0 in | Wt 129.4 lb

## 2018-06-21 DIAGNOSIS — R636 Underweight: Secondary | ICD-10-CM | POA: Diagnosis not present

## 2018-06-21 DIAGNOSIS — J449 Chronic obstructive pulmonary disease, unspecified: Secondary | ICD-10-CM | POA: Diagnosis not present

## 2018-06-21 DIAGNOSIS — J9611 Chronic respiratory failure with hypoxia: Secondary | ICD-10-CM | POA: Insufficient documentation

## 2018-06-21 DIAGNOSIS — Z23 Encounter for immunization: Secondary | ICD-10-CM

## 2018-06-21 DIAGNOSIS — F17201 Nicotine dependence, unspecified, in remission: Secondary | ICD-10-CM

## 2018-06-21 MED ORDER — TIOTROPIUM BROMIDE MONOHYDRATE 2.5 MCG/ACT IN AERS: 2.0000 | INHALATION_SPRAY | Freq: Every day | RESPIRATORY_TRACT | 3 refills | Status: DC

## 2018-06-21 MED ORDER — TIOTROPIUM BROMIDE MONOHYDRATE 2.5 MCG/ACT IN AERS: 2.0000 | INHALATION_SPRAY | Freq: Every day | RESPIRATORY_TRACT | 0 refills | Status: DC

## 2018-06-21 MED ORDER — BUDESONIDE-FORMOTEROL FUMARATE 160-4.5 MCG/ACT IN AERO: 2.0000 | INHALATION_SPRAY | Freq: Two times a day (BID) | RESPIRATORY_TRACT | 0 refills | Status: DC

## 2018-06-21 MED ORDER — BUDESONIDE-FORMOTEROL FUMARATE 160-4.5 MCG/ACT IN AERO: 2.0000 | INHALATION_SPRAY | Freq: Two times a day (BID) | RESPIRATORY_TRACT | 3 refills | Status: DC

## 2018-06-21 NOTE — Assessment & Plan Note (Signed)
Plan: Continue oxygen therapy as prescribed Maintain oxygen saturations greater than 88 to 90% Contact our office if you are unable to maintain oxygen saturations at goal

## 2018-06-21 NOTE — Assessment & Plan Note (Signed)
>>  ASSESSMENT AND PLAN FOR CHRONIC OBSTRUCTIVE PULMONARY DISEASE (HCC) WRITTEN ON 06/21/2018 12:22 PM BY MACK, BRIAN P, NP  Assessment: Former smoker 75-pack-year smoking history Currently maintained on Symbicort 160 and Spiriva 2.5 Improved shortness of breath and wheezing since last office visit Thin cachectic female mMRC 2 Lungs clear to auscultation today Barrel chest  Plan: Symbicort 160 twice a day okay to use without spacer Spiriva 2.5 If patient continues to struggle with inhalers may need to consider Stiolto Respimat with inhaled budesonide Follow-up with Dr. Tonia Brooms in April/2020 Follow-up with lung cancer screening program to obtain low-dose CT screening and shared decision making with Kandice Robinsons, NP Pneumovax 23 today Discussed importance of pulmonary rehab, patient declined due to financial cost Complete bone density scan as ordered

## 2018-06-21 NOTE — Assessment & Plan Note (Addendum)
Assessment: Former smoker 75-pack-year smoking history Currently maintained on Symbicort 160 and Spiriva 2.5 Improved shortness of breath and wheezing since last office visit Thin cachectic female mMRC 2 Lungs clear to auscultation today Barrel chest  Plan: Symbicort 160 twice a day okay to use without spacer Spiriva 2.5 If patient continues to struggle with inhalers may need to consider Stiolto Respimat with inhaled budesonide Follow-up with Dr. Tonia Brooms in April/2020 Follow-up with lung cancer screening program to obtain low-dose CT screening and shared decision making with Kandice Robinsons, NP Pneumovax 23 today Discussed importance of pulmonary rehab, patient declined due to financial cost Complete bone density scan as ordered

## 2018-06-21 NOTE — Progress Notes (Signed)
@Patient  ID: Vickie Bowman, female    DOB: [birth year], 68 y.o.   MRN: [id number]  Chief Complaint  Patient presents with  . Follow-up    COPD    Referring provider: Aida Puffer, MD  HPI:  68 year old female former smoker (75-pack-year smoking history) followed in our office for COPD with emphysema  PMH: Anxiety, protein calorie malnutrition Smoker/ Smoking History: Former smoker.  Quit August/2019.  75-pack-year smoking history. Maintenance: Symbicort 160, Spiriva 2.5 Pt of: Dr. Tonia Brooms   06/21/2018  - Visit   68 year old female former smoker presenting to our office today for a two-week follow-up visit.  Patient reports she is been maintained on Symbicort 160 and Spiriva 2.5.  The patient likes Spiriva 2.5 but does not like using her Symbicort 160 with her spacer.  She feels that the spacer impairs her getting the medication.  Patient feels that her breathing is doing much better since last office visit.  She uses her DuoNeb nebulized medication about once a week and uses her rescue inhaler about once daily or every other day..  Patient would like to stop using the spacer and just use Symbicort 160 by itself.  Patient continues to want to try Trelegy Ellipta and Anoro Ellipta.  Will review why she cannot do that due to her low inspiratory pull at last office visit.  Patient was also referred to the lung cancer screening program but this has not been scheduled yet for a shared decision making with Kandice Robinsons, NP.  Patient was also referred to pulmonary rehab which she did not answer when they were calling and so the referral has been closed.  Patient is unsure should be able to afford pulmonary rehab due to the $20 co-pay for each office visit.  Patient also has concerns regarding her bone density scan and wants to make sure that she has an order to complete this.  MMRC - Breathlessness Score 2 - on level ground, I walk slower than people of the same age because of breathlessness, or have  to stop for breathe when walking to my own pace    Tests:   Chest Imaging: 03/04/2018 chest x-ray: Emphysematous lung changes The patient's images have been independently reviewed by me.    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 2. Normal left ventricular filling pressure. Low normal left ventricular filling pressure with subtle apical hypokinesis (LVEF 50-55%).  SIX MIN WALK 06/06/2018  Medications Proair at 3pm, Aspirin 81mg  at 7:30, Vitamin D 2000 units, metoprolol 12.5mg , Dulera 100, Spiriva at 10am  Supplimental Oxygen during Test? (L/min) Yes  O2 Flow Rate 3  Type Continuous  Laps 9  Partial Lap (in Meters) 0  Baseline BP (sitting) 114/70  Baseline Heartrate 91  Baseline Dyspnea (Borg Scale) 3  Baseline Fatigue (Borg Scale) 3  Baseline SPO2 98  BP (sitting) 130/80  Heartrate 115  Dyspnea (Borg Scale) 9  Fatigue (Borg Scale) 4  SPO2 95  BP (sitting) 122/70  Heartrate 101  SPO2 98  Stopped or Paused before Six Minutes No  Distance Completed 306  Tech Comments: Pt walked at a normal pace without stopping during the walk. Pt denied any complaints.     FENO:  No results found for: NITRICOXIDE  PFT: PFT Results Latest Ref Rng & Units 03/30/2018  FVC-Pre L 2.11  FVC-Predicted Pre % 100  FVC-Post L 2.31  FVC-Predicted Post % 109  Pre FEV1/FVC % % 41  Post FEV1/FCV % % 48  FEV1-Pre L 0.87  FEV1-Predicted Pre % 55  FEV1-Post L 1.10  DLCO UNC% % 78  DLCO COR %Predicted % 59  TLC L 8.24  TLC % Predicted % 213  RV % Predicted % 341    Imaging: No results found.    Specialty Problems      Pulmonary Problems   Acute respiratory failure with hypoxia (HCC)   COPD exacerbation (HCC)   COPD (chronic obstructive pulmonary disease) (HCC)   Chronic respiratory failure with  hypoxia (HCC)      Allergies  Allergen Reactions  . Penicillins Swelling    Has patient had a PCN reaction causing immediate rash, facial/tongue/throat swelling, SOB or lightheadedness with hypotension: Yes Has patient had a PCN reaction causing severe rash involving mucus membranes or skin necrosis: No Has patient had a PCN reaction that required hospitalization: No Has patient had a PCN reaction occurring within the last 10 years: No If all of the above answers are "NO", then may proceed with Cephalosporin use.     Immunization History  Administered Date(s) Administered  . Influenza-Unspecified 03/21/2018  . Pneumococcal Polysaccharide-23 06/21/2018   Pneumovax 23 today in office  Past Medical History:  Diagnosis Date  . Anxiety   . CHF (congestive heart failure) (HCC)   . COPD (chronic obstructive pulmonary disease) (HCC)   . Lumbar herniated disc   . Tobacco abuse     Tobacco History: Social History   Tobacco Use  Smoking Status Former Smoker  . Packs/day: 1.50  . Years: 50.00  . Pack years: 75.00  . Start date: 05/24/1967  . Last attempt to quit: 01/07/2018  . Years since quitting: 0.4  Smokeless Tobacco Never Used  Tobacco Comment   Smoked since age 55, quit 12/2017   Counseling given: Yes Comment: Smoked since age 64, quit 12/2017  I will follow-up on the referral placed to the lung cancer screening program discussed the importance of patient completing the screening today.  Patient is willing to schedule shared decision making.  Outpatient Encounter Medications as of 06/21/2018  Medication Sig  . ALPRAZolam (XANAX) 0.25 MG tablet Take 1 tablet by mouth 2 (two) times daily.  Marland Kitchen aspirin EC 81 MG EC tablet Take 1 tablet (81 mg total) by mouth daily.  . benzonatate (TESSALON) 100 MG capsule Take 1 capsule (100 mg total) by mouth 3 (three) times daily as needed for cough.  . budesonide-formoterol (SYMBICORT) 160-4.5 MCG/ACT inhaler Inhale 2 puffs into the lungs  every 12 (twelve) hours.  . Cholecalciferol (VITAMIN D) 2000 units CAPS Take 1 capsule (2,000 Units total) by mouth daily.  . feeding supplement, ENSURE ENLIVE, (ENSURE ENLIVE) LIQD Take 237 mLs by mouth 2 (two) times daily between meals.  Marland Kitchen guaiFENesin (MUCINEX) 600 MG 12 hr tablet Take 1 tablet (600 mg total) by mouth 2 (two) times daily.  Marland Kitchen HYDROcodone-acetaminophen (NORCO/VICODIN) 5-325 MG tablet Take 1 tablet by mouth 2 (two) times daily as needed for pain.  . metoprolol succinate (TOPROL-XL) 25 MG 24 hr tablet Take 0.5 tablets (12.5 mg total) by mouth daily.  Marland Kitchen PROAIR HFA 108 (90 Base) MCG/ACT inhaler Inhale 2 puffs into the lungs 4 (four) times daily as needed for wheezing.  Marland Kitchen Spacer/Aero-Holding Chambers (AEROCHAMBER MV) inhaler Use as instructed  . Tiotropium Bromide Monohydrate (SPIRIVA RESPIMAT) 2.5 MCG/ACT AERS Inhale 2 puffs into the lungs daily.  . [DISCONTINUED] ipratropium-albuterol (DUONEB) 0.5-2.5 (  3) MG/3ML SOLN Take 3 mLs by nebulization every 6 (six) hours as needed.  . budesonide-formoterol (SYMBICORT) 160-4.5 MCG/ACT inhaler Inhale 2 puffs into the lungs 2 (two) times daily.  . [EXPIRED] predniSONE (DELTASONE) 20 MG tablet Take 2 tablets (40 mg total) by mouth daily with breakfast for 5 days.  . Tiotropium Bromide Monohydrate (SPIRIVA RESPIMAT) 2.5 MCG/ACT AERS Inhale 2 puffs into the lungs daily.  . [DISCONTINUED] mometasone-formoterol (DULERA) 100-5 MCG/ACT AERO Inhale 2 puffs into the lungs 2 (two) times daily. (Patient not taking: Reported on 06/21/2018)  . [DISCONTINUED] tiotropium (SPIRIVA) 18 MCG inhalation capsule Place 1 capsule (18 mcg total) into inhaler and inhale daily. (Patient not taking: Reported on 06/21/2018)   No facility-administered encounter medications on file as of 06/21/2018.      Review of Systems  Review of Systems  Constitutional: Positive for fatigue (Occasional fatigue).  HENT: Negative for congestion, sinus pressure and sinus pain.     Respiratory: Positive for shortness of breath (Baseline). Negative for cough, chest tightness and wheezing.   Cardiovascular: Negative for chest pain and palpitations.  Gastrointestinal: Negative for diarrhea, nausea and vomiting.  Musculoskeletal: Negative for arthralgias.  Allergic/Immunologic: Negative for environmental allergies.  Psychiatric/Behavioral: Negative for dysphoric mood. The patient is not nervous/anxious.      Physical Exam  BP 100/60 (BP Location: Left Arm, Cuff Size: Normal)   Pulse 74   Ht 5\' 7"  (1.702 m)   Wt 129 lb 6.4 oz (58.7 kg)   SpO2 94%   BMI 20.27 kg/m   Wt Readings from Last 5 Encounters:  06/21/18 129 lb 6.4 oz (58.7 kg)  06/07/18 123 lb 12.8 oz (56.2 kg)  06/06/18 122 lb 9.6 oz (55.6 kg)  03/27/18 113 lb 6.4 oz (51.4 kg)  03/05/18 109 lb 2 oz (49.5 kg)   2 L via POC  Physical Exam  Constitutional: She is oriented to person, place, and time and well-developed, well-nourished, and in no distress. Vital signs are normal. She appears cachectic. No distress.  + Thin elderly female  HENT:  Head: Normocephalic and atraumatic.  Right Ear: Hearing, tympanic membrane, external ear and ear canal normal.  Left Ear: Hearing, tympanic membrane, external ear and ear canal normal.  Nose: Mucosal edema present. Right sinus exhibits no maxillary sinus tenderness and no frontal sinus tenderness. Left sinus exhibits no maxillary sinus tenderness and no frontal sinus tenderness.  Mouth/Throat: Uvula is midline and oropharynx is clear and moist. She has dentures. No oropharyngeal exudate.  Eyes: Pupils are equal, round, and reactive to light.  Neck: Normal range of motion. Neck supple. No JVD present.  Cardiovascular: Normal rate, regular rhythm and normal heart sounds.  Pulmonary/Chest: Effort normal and breath sounds normal. No accessory muscle usage. No respiratory distress. She has no decreased breath sounds. She has no wheezes. She has no rhonchi.  Abdominal:  Soft. Bowel sounds are normal. There is no abdominal tenderness.  Musculoskeletal: Normal range of motion.        General: No edema.  Lymphadenopathy:    She has no cervical adenopathy.  Neurological: She is alert and oriented to person, place, and time. Gait normal.  Skin: Skin is warm and dry. She is not diaphoretic. No erythema.  Psychiatric: Mood, memory, affect and judgment normal.  Nursing note and vitals reviewed.     Lab Results:  CBC    Component Value Date/Time   WBC 5.7 03/05/2018 0327   RBC 4.61 03/05/2018 0327   HGB 14.0 03/05/2018 0327  HCT 44.4 03/05/2018 0327   PLT 228 03/05/2018 0327   MCV 96.3 03/05/2018 0327   MCH 30.4 03/05/2018 0327   MCHC 31.5 03/05/2018 0327   RDW 12.9 03/05/2018 0327   LYMPHSABS 2.3 03/04/2018 2146   MONOABS 0.7 03/04/2018 2146   EOSABS 0.2 03/04/2018 2146   BASOSABS 0.1 03/04/2018 2146    BMET    Component Value Date/Time   NA 142 03/05/2018 0327   K 3.9 03/05/2018 0327   CL 110 03/05/2018 0327   CO2 25 03/05/2018 0327   GLUCOSE 154 (H) 03/05/2018 0327   BUN 12 03/05/2018 0327   CREATININE 0.75 03/05/2018 0327   CALCIUM 8.8 (L) 03/05/2018 0327   GFRNONAA >60 03/05/2018 0327   GFRAA >60 03/05/2018 0327    BNP    Component Value Date/Time   BNP 12.8 03/04/2018 2146    ProBNP No results found for: PROBNP    Assessment & Plan:    COPD (chronic obstructive pulmonary disease) (HCC) Assessment: Former smoker 75-pack-year smoking history Currently maintained on Symbicort 160 and Spiriva 2.5 Improved shortness of breath and wheezing since last office visit Thin cachectic female mMRC 2 Lungs clear to auscultation today Barrel chest  Plan: Symbicort 160 twice a day okay to use without spacer Spiriva 2.5 If patient continues to struggle with inhalers may need to consider Stiolto Respimat with inhaled budesonide Follow-up with Dr. Tonia Brooms in April/2020 Follow-up with lung cancer screening program to obtain  low-dose CT screening and shared decision making with Kandice Robinsons, NP Pneumovax 23 today Discussed importance of pulmonary rehab, patient declined due to financial cost Complete bone density scan as ordered  Tobacco abuse, in remission Assessment: BMI 20.2 75-pack-year smoking history Current open referral to lung cancer screening program  Plan: Complete lung cancer screening shared decision making visit Continue to not smoke Pneumovax 23 today  Underweight due to inadequate caloric intake Plan: Continue Ensure  Chronic respiratory failure with hypoxia (HCC) Plan: Continue oxygen therapy as prescribed Maintain oxygen saturations greater than 88 to 90% Contact our office if you are unable to maintain oxygen saturations at goal     Coral Ceo, NP 06/21/2018   This appointment was 35 minutes long with over 50% of the time in direct face-to-face patient care, assessment, plan of care, and follow-up.

## 2018-06-21 NOTE — Assessment & Plan Note (Signed)
Assessment: BMI 20.2 75-pack-year smoking history Current open referral to lung cancer screening program  Plan: Complete lung cancer screening shared decision making visit Continue to not smoke Pneumovax 23 today

## 2018-06-21 NOTE — Assessment & Plan Note (Signed)
Plan: Continue Ensure

## 2018-06-21 NOTE — Patient Instructions (Addendum)
Continue Symbicort 160 >>> 2 puffs in the morning right when you wake up, rinse out your mouth after use, 12 hours later 2 puffs, rinse after use >>> Take this daily, no matter what >>> This is not a rescue inhaler   Spiriva Respimat 2.5 >>> 2 puffs daily >>> Do this every day >>>This is not a rescue inhaler  Only use your albuterol as a rescue medication to be used if you can't catch your breath by resting or doing a relaxed purse lip breathing pattern.  - The less you use it, the better it will work when you need it. - Ok to use up to 2 puffs  every 4 hours if you must but call for immediate appointment if use goes up over your usual need - Don't leave home without it !!  (think of it like the spare tire for your car)   Can also use DuoNeb nebulized medications every 8 hours as needed for shortness of breath and wheezing  We recommended that you complete pulmonary rehab you declined this at this time due to financial concerns >>>Contact our office if you change your mind and you are willing to enroll in pulmonary rehab  Pneumovax 23 vaccine today  Complete 06/27/2018 bone density screening as ordered by Dr. Lillia Mountain our office if you feel your inhaler is not working and schedule an office visit and we could consider switching your inhaler regimen around to either nebulized medications or Stiolto with a nebulized steroid   We will refer you today to her lung cancer screening program >>>This is based off of your 75 pack-year smoking history >>> This is a recommendation from the [place] preventative services task force (USPSTF) >>>The USPSTF recommends annual screening for lung cancer with low-dose computed tomography (LDCT) in adults aged 52 to 80 years who have a 30 pack-year smoking history and currently smoke or have quit within the past 15 years. Screening should be discontinued once a person has not smoked for 15 years or develops a health problem that substantially limits life  expectancy or the ability or willingness to have curative lung surgery.   Our office will call you and set up an appointment with Kandice Robinsons (Nurse Practitioner) who leads this program.  This appointment takes place in our office.  After completing this meeting with Kandice Robinsons NP you will get a low-dose CT as the screening >>>We will call you with those results    Keep scheduled follow-up with Dr. Tonia Brooms in April/2020     It is flu season:   >>>Remember to be washing your hands regularly, using hand sanitizer, be careful to use around herself with has contact with people who are sick will increase her chances of getting sick yourself. >>> Best ways to protect herself from the flu: Receive the yearly flu vaccine, practice good hand hygiene washing with soap and also using hand sanitizer when available, eat a nutritious meals, get adequate rest, hydrate appropriately   Please contact the office if your symptoms worsen or you have concerns that you are not improving.   Thank you for choosing [facility] for your healthcare, and for allowing [place] to partner with you on your healthcare journey. I am thankful to be able to provide care to you today.   Elisha Headland FNP-C    Chronic Obstructive Pulmonary Disease Chronic obstructive pulmonary disease (COPD) is a long-term (chronic) lung problem. When you have COPD, it is hard for air to get in  and out of your lungs. Usually the condition gets worse over time, and your lungs will never return to normal. There are things you can do to keep yourself as healthy as possible.  Your doctor may treat your condition with: ? Medicines. ? Oxygen. ? Lung surgery.  Your doctor may also recommend: ? Rehabilitation. This includes steps to make your body work better. It may involve a team of specialists. ? Quitting smoking, if you smoke. ? Exercise and changes to your diet. ? Comfort measures (palliative care). Follow these instructions at  home: Medicines  Take over-the-counter and prescription medicines only as told by your doctor.  Talk to your doctor before taking any cough or allergy medicines. You may need to avoid medicines that cause your lungs to be dry. Lifestyle  If you smoke, stop. Smoking makes the problem worse. If you need help quitting, ask your doctor.  Avoid being around things that make your breathing worse. This may include smoke, chemicals, and fumes.  Stay active, but remember to rest as well.  Learn and use tips on how to relax.  Make sure you get enough sleep. Most adults need at least 7 hours of sleep every night.  Eat healthy foods. Eat smaller meals more often. Rest before meals. Controlled breathing Learn and use tips on how to control your breathing as told by your doctor. Try:  Breathing in (inhaling) through your nose for 1 second. Then, pucker your lips and breath out (exhale) through your lips for 2 seconds.  Putting one hand on your belly (abdomen). Breathe in slowly through your nose for 1 second. Your hand on your belly should move out. Pucker your lips and breathe out slowly through your lips. Your hand on your belly should move in as you breathe out.  Controlled coughing Learn and use controlled coughing to clear mucus from your lungs. Follow these steps: 1. Lean your head a little forward. 2. Breathe in deeply. 3. Try to hold your breath for 3 seconds. 4. Keep your mouth slightly open while coughing 2 times. 5. Spit any mucus out into a tissue. 6. Rest and do the steps again 1 or 2 times as needed. General instructions  Make sure you get all the shots (vaccines) that your doctor recommends. Ask your doctor about a flu shot and a pneumonia shot.  Use oxygen therapy and pulmonary rehabilitation if told by your doctor. If you need home oxygen therapy, ask your doctor if you should buy a tool to measure your oxygen level (oximeter).  Make a COPD action plan with your doctor.  This helps you to know what to do if you feel worse than usual.  Manage any other conditions you have as told by your doctor.  Avoid going outside when it is very hot, cold, or humid.  Avoid people who have a sickness you can catch (contagious).  Keep all follow-up visits as told by your doctor. This is important. Contact a doctor if:  You cough up more mucus than usual.  There is a change in the color or thickness of the mucus.  It is harder to breathe than usual.  Your breathing is faster than usual.  You have trouble sleeping.  You need to use your medicines more often than usual.  You have trouble doing your normal activities such as getting dressed or walking around the house. Get help right away if:  You have shortness of breath while resting.  You have shortness of breath that stops  you from: ? Being able to talk. ? Doing normal activities.  Your chest hurts for longer than 5 minutes.  Your skin color is more blue than usual.  Your pulse oximeter shows that you have low oxygen for longer than 5 minutes.  You have a fever.  You feel too tired to breathe normally. Summary  Chronic obstructive pulmonary disease (COPD) is a long-term lung problem.  The way your lungs work will never return to normal. Usually the condition gets worse over time. There are things you can do to keep yourself as healthy as possible.  Take over-the-counter and prescription medicines only as told by your doctor.  If you smoke, stop. Smoking makes the problem worse. This information is not intended to replace advice given to you by your health care provider. Make sure you discuss any questions you have with your health care provider. Document Released: 10/26/2007 Document Revised: 06/13/2016 Document Reviewed: 06/13/2016 Elsevier Interactive Patient Education  2019 Else[employer]    Pneumococcal Vaccine, Polyvalent solution for injection What is this medicine? PNEUMOCOCCAL  VACCINE, POLYVALENT (NEU mo KOK al vak SEEN, pol ee VEY luhnt) is a vaccine to prevent pneumococcus bacteria infection. These bacteria are a major cause of ear infections, Strep throat infections, and serious pneumonia, meningitis, or blood infections worldwide. These vaccines help the body to produce antibodies (protective substances) that help your body defend against these bacteria. This vaccine is recommended for people 40 years of age and older with health problems. It is also recommended for all adults over 48 years old. This vaccine will not treat an infection. This medicine may be used for other purposes; ask your health care provider or pharmacist if you have questions. COMMON BRAND NAME(S): Pneumovax 23 What should I tell my health care provider before I take this medicine? They need to know if you have any of these conditions: -bleeding problems -bone marrow or organ transplant -cancer, Hodgkin's disease -fever -infection -immune system problems -low platelet count in the blood -seizures -an unusual or allergic reaction to pneumococcal vaccine, diphtheria toxoid, other vaccines, latex, other medicines, foods, dyes, or preservatives -pregnant or trying to get pregnant -breast-feeding How should I use this medicine? This vaccine is for injection into a muscle or under the skin. It is given by a health care professional. A copy of Vaccine Information Statements will be given before each vaccination. Read this sheet carefully each time. The sheet may change frequently. Talk to your pediatrician regarding the use of this medicine in children. While this drug may be prescribed for children as young as 60 years of age for selected conditions, precautions do apply. Overdosage: If you think you have taken too much of this medicine contact a poison control center or emergency room at once. NOTE: This medicine is only for you. Do not share this medicine with others. What if I miss a dose? It is  important not to miss your dose. Call your doctor or health care professional if you are unable to keep an appointment. What may interact with this medicine? -medicines for cancer chemotherapy -medicines that suppress your immune function -medicines that treat or prevent blood clots like warfarin, enoxaparin, and dalteparin -steroid medicines like prednisone or cortisone This list may not describe all possible interactions. Give your health care provider a list of all the medicines, herbs, non-prescription drugs, or dietary supplements you use. Also tell them if you smoke, drink alcohol, or use illegal drugs. Some items may interact with your medicine. What should  I watch for while using this medicine? Mild fever and pain should go away in 3 days or less. Report any unusual symptoms to your doctor or health care professional. What side effects may I notice from receiving this medicine? Side effects that you should report to your doctor or health care professional as soon as possible: -allergic reactions like skin rash, itching or hives, swelling of the face, lips, or tongue -breathing problems -confused -fever over 102 degrees F -pain, tingling, numbness in the hands or feet -seizures -unusual bleeding or bruising -unusual muscle weakness Side effects that usually do not require medical attention (report to your doctor or health care professional if they continue or are bothersome): -aches and pains -diarrhea -fever of 102 degrees F or less -headache -irritable -loss of appetite -pain, tender at site where injected -trouble sleeping This list may not describe all possible side effects. Call your doctor for medical advice about side effects. You may report side effects to FDA at 1-800-FDA-1088. Where should I keep my medicine? This does not apply. This vaccine is given in a clinic, pharmacy, doctor's office, or other health care setting and will not be stored at home. NOTE: This sheet  is a summary. It may not cover all possible information. If you have questions about this medicine, talk to your doctor, pharmacist, or health care provider.  2019 Elsevier/Gold Standard (2007-12-14 14:32:37)

## 2018-06-23 NOTE — Progress Notes (Signed)
PCCM: Agree. Thanks for seeing her.  [name], DO [facility] 06/23/2018 6:39 PM

## 2018-06-27 ENCOUNTER — Ambulatory Visit (INDEPENDENT_AMBULATORY_CARE_PROVIDER_SITE_OTHER)
Admission: RE | Admit: 2018-06-27 | Discharge: 2018-06-27 | Disposition: A | Discharge status: Home / Self Care | Payer: Medicare Other | Source: Ambulatory Visit | Attending: Family Medicine | Admitting: Family Medicine

## 2018-06-27 DIAGNOSIS — J449 Chronic obstructive pulmonary disease, unspecified: Secondary | ICD-10-CM | POA: Diagnosis not present

## 2018-06-29 ENCOUNTER — Telehealth: Payer: Self-pay | Admitting: Acute Care

## 2018-06-29 DIAGNOSIS — Z87891 Personal history of nicotine dependence: Secondary | ICD-10-CM

## 2018-06-29 DIAGNOSIS — Z122 Encounter for screening for malignant neoplasm of respiratory organs: Secondary | ICD-10-CM

## 2018-07-03 NOTE — Telephone Encounter (Signed)
Spoke with pt and scheduled [facility] 07/23/18 4:00 CT ordered Nothing further needed

## 2018-07-05 ENCOUNTER — Telehealth: Payer: Self-pay | Admitting: Acute Care

## 2018-07-05 ENCOUNTER — Telehealth: Payer: Self-pay | Admitting: Pulmonary Disease

## 2018-07-05 NOTE — Telephone Encounter (Signed)
Notes recorded by [name], DO on 07/05/2018 at 4:21 PM EST [place], you can let her know that she has osteopenia in her lumbar spine and left femur and numbers consistent with osteoporosis of the right hip. She should take an OTC calcium and vitamin D supplement and follow up with her PCP or an endocrinologist to discuss osteoporosis treatments.   Thanks,  BLI  Patient is aware and nothing further is needed at this time.

## 2018-07-05 NOTE — Telephone Encounter (Signed)
Spoke with pt and rescheduled [facility] 07/25/18 11:30 CT will be rescheduled Nothing further needed

## 2018-07-23 ENCOUNTER — Encounter: Payer: Medicare Other | Admitting: Acute Care

## 2018-07-23 ENCOUNTER — Ambulatory Visit: Payer: Medicare Other

## 2018-07-25 ENCOUNTER — Encounter: Payer: Self-pay | Admitting: Acute Care

## 2018-07-25 ENCOUNTER — Ambulatory Visit (INDEPENDENT_AMBULATORY_CARE_PROVIDER_SITE_OTHER): Payer: Medicare Other | Admitting: Acute Care

## 2018-07-25 ENCOUNTER — Ambulatory Visit (INDEPENDENT_AMBULATORY_CARE_PROVIDER_SITE_OTHER)
Admission: RE | Admit: 2018-07-25 | Discharge: 2018-07-25 | Disposition: A | Discharge status: Home / Self Care | Payer: Medicare Other | Source: Ambulatory Visit | Attending: Acute Care | Admitting: Acute Care

## 2018-07-25 VITALS — BP 112/68 | HR 81 | Ht 67.0 in | Wt 128.6 lb

## 2018-07-25 DIAGNOSIS — Z87891 Personal history of nicotine dependence: Secondary | ICD-10-CM

## 2018-07-25 DIAGNOSIS — Z122 Encounter for screening for malignant neoplasm of respiratory organs: Secondary | ICD-10-CM

## 2018-07-25 NOTE — Progress Notes (Signed)
Shared Decision Making Visit Lung Cancer Screening Program [phone])   Eligibility:  Age 68 y.o.  Pack Years Smoking History Calculation 53 pack year smoker (# packs/per year x # years smoked)  Recent History of coughing up blood  no  Unexplained weight loss? no ( >Than 15 pounds within the last 6 months )  Prior History Lung / other cancer no (Diagnosis within the last 5 years already requiring surveillance chest CT Scans).  Smoking Status Former Smoker  Former Smokers: Years since quit:< 1 year  Quit Date: 2019  Visit Components:  Discussion included one or more decision making aids. yes  Discussion included risk/benefits of screening. yes  Discussion included potential follow up diagnostic testing for abnormal scans. yes  Discussion included meaning and risk of over diagnosis. yes  Discussion included meaning and risk of False Positives. yes  Discussion included meaning of total radiation exposure. yes  Counseling Included:  Importance of adherence to annual lung cancer LDCT screening. yes  Impact of comorbidities on ability to participate in the program. yes  Ability and willingness to under diagnostic treatment. yes  Smoking Cessation Counseling:  Current Smokers:   Discussed importance of smoking cessation. NA  Information about tobacco cessation classes and interventions provided to patient. yes  Patient provided with "ticket" for LDCT Scan. yes  Symptomatic Patient. no  Counseling  Diagnosis Code: Tobacco Use Z72.0  Asymptomatic Patient yes  Counseling (Intermediate counseling: > three minutes counseling) [id number]  Former Smokers:   Discussed the importance of maintaining cigarette abstinence. yes  Diagnosis Code: Personal History of Nicotine Dependence. P95.093  Information about tobacco cessation classes and interventions provided to patient. Yes  Patient provided with "ticket" for LDCT Scan. yes  Written Order for Lung Cancer Screening  with LDCT placed in Epic. Yes (CT Chest Lung Cancer Screening Low Dose W/O CM) [id number] Z12.2-Screening of respiratory organs Z87.891-Personal history of nicotine dependence  I spent 25 minutes of face to face time with Ms. Fullenwider discussing the risks and benefits of lung cancer screening. We viewed a power point together that explained in detail the above noted topics. We took the time to pause the power point at intervals to allow for questions to be asked and answered to ensure understanding. We discussed that she had taken the single most powerful action possible to decrease her risk of developing lung cancer when she quit smoking. I counseled her to remain smoke free, and to contact me if she ever had the desire to smoke again so that I can provide resources and tools to help support the effort to remain smoke free. We discussed the time and location of the scan, and that either  Abigail Miyamoto RN or I will call with the results within  24-48 hours of receiving them. She has my card and contact information in the event she needs to speak with me, in addition to a copy of the power point we reviewed as a resource. She verbalized understanding of all of the above and had no further questions upon leaving the office.     I explained to the patient that there has been a high incidence of coronary artery disease noted on these exams. I explained that this is a non-gated exam therefore degree or severity cannot be determined. This patient is not on statin therapy. I have asked the patient to follow-up with their PCP regarding any incidental finding of coronary artery disease and management with diet or medication as they feel is clinically indicated.  The patient verbalized understanding of the above and had no further questions.     Bevelyn Ngo, NP 07/25/2018 11:20 AM

## 2018-07-26 ENCOUNTER — Other Ambulatory Visit: Payer: Self-pay | Admitting: Acute Care

## 2018-07-26 DIAGNOSIS — Z87891 Personal history of nicotine dependence: Secondary | ICD-10-CM

## 2018-07-26 DIAGNOSIS — Z122 Encounter for screening for malignant neoplasm of respiratory organs: Secondary | ICD-10-CM

## 2018-09-12 ENCOUNTER — Ambulatory Visit: Payer: Medicare Other | Admitting: Pulmonary Disease

## 2018-10-25 ENCOUNTER — Other Ambulatory Visit: Payer: Self-pay | Admitting: Pulmonary Disease

## 2018-11-28 ENCOUNTER — Other Ambulatory Visit: Payer: Self-pay | Admitting: Pulmonary Disease

## 2018-11-29 ENCOUNTER — Encounter: Payer: Self-pay | Admitting: Pulmonary Disease

## 2018-11-29 ENCOUNTER — Other Ambulatory Visit: Payer: Self-pay

## 2018-11-29 ENCOUNTER — Ambulatory Visit (INDEPENDENT_AMBULATORY_CARE_PROVIDER_SITE_OTHER): Payer: Medicare Other | Admitting: Pulmonary Disease

## 2018-11-29 VITALS — BP 132/78 | HR 96 | Ht 67.0 in | Wt 140.6 lb

## 2018-11-29 DIAGNOSIS — Z87891 Personal history of nicotine dependence: Secondary | ICD-10-CM

## 2018-11-29 DIAGNOSIS — J9611 Chronic respiratory failure with hypoxia: Secondary | ICD-10-CM

## 2018-11-29 DIAGNOSIS — F17201 Nicotine dependence, unspecified, in remission: Secondary | ICD-10-CM

## 2018-11-29 DIAGNOSIS — J479 Bronchiectasis, uncomplicated: Secondary | ICD-10-CM

## 2018-11-29 DIAGNOSIS — J449 Chronic obstructive pulmonary disease, unspecified: Secondary | ICD-10-CM

## 2018-11-29 MED ORDER — SPIRIVA RESPIMAT 2.5 MCG/ACT IN AERS: 2.0000 | INHALATION_SPRAY | Freq: Every day | RESPIRATORY_TRACT | 0 refills | Status: DC

## 2018-11-29 MED ORDER — SYMBICORT 160-4.5 MCG/ACT IN AERO: 2.0000 | INHALATION_SPRAY | Freq: Two times a day (BID) | RESPIRATORY_TRACT | 2 refills | Status: DC

## 2018-11-29 MED ORDER — BUDESONIDE-FORMOTEROL FUMARATE 160-4.5 MCG/ACT IN AERO: 2.0000 | INHALATION_SPRAY | Freq: Two times a day (BID) | RESPIRATORY_TRACT | 0 refills | Status: DC

## 2018-11-29 NOTE — Progress Notes (Signed)
Synopsis: Referred in November 2019 for COPD by Tamsen Roers, MD  Subjective:   PATIENT ID: Vickie Bowman GENDER: female DOB: 1950-10-08, MRN: [id number]  Chief Complaint  Patient presents with  . Follow-up    F/U re: COPD. She is currently using 2L pulsed. Currently using albuterol, spiriva, and symbicort.     PMH of CHF, tobacco abuse, quit smoking Aug 2019. Smoked since age 68 years.  Patient was admitted to the hospital in September 2019 as well as in October 2019 for COPD exacerbations.  The patient's most recent hospitalization for COPD she developed hypoxemic respiratory failure requiring nasal cannula supplementation.  Initial echocardiogram revealed a reduced ejection fraction and she was subsequently taken for a left heart cath in September 2019 which revealed nonobstructive CAD.  The patient was treated in the emergency room with CPAP, steroids, magnesium, albuterol.  Hospital course was relatively uncomplicated and the patient was discharged home on oxygen therapy.  Since her hospitalization she has been overall been doing well.  She does have significant dyspnea on exertion.  She feels like she has slowly been recovering.  She does acknowledge that she has been able to quit smoking.  She is currently on 3 L pulse from a POC.  She states that she has been trying to eat.  She does know that her BMI is low.  Patient denies chest pain, hemoptysis, daily sputum production.  OV 06/06/2018: She feels like she is a little congested. This has been getting worse throughout the past week. Cough is much worse. Not really bringing anything up at this time. She trys but it feels stuck.  Patient denies any fevers.  She has woke up in the middle the night with ongoing cough for the past several nights.  She does get up in the middle of the night and feel like her chest is more tight.  She has been using her albuterol more over the past couple of days.  Does not have any significant sputum  production.  OV 11/29/2018: Here today for follow-up for management of severe COPD.  She has oxygen dependent respiratory failure on oxygen concentrator today in the office at 3 L.  Patient currently managed well on Symbicort and Spiriva Respimat.  She likes her inhaler regimen.  She does notice that has made her breathe better.  Today in the office we discussed her goals of care.  She states that she remembers distinctly seeing her mother received cardiopulmonary resuscitation.  She would not ever want this to occur to her.  She understands that at some point she may get to the point where she is unable to breathe.  And would not want to be placed on mechanical life support.  We discussed all of this to include risk, benefits and alternatives in detail today in the office.  She is elected to proceed with filling out durable DNR forms today in the office.  As for her ongoing respiratory symptoms she wants to continue the use of her inhalers as long as possible.  We discussed the alternatives of using nebulized therapy as well.  She states that they also has trouble with her current POC machine.  She discussed this with her provider DME provider.  They need a new prescription to help with replacing the old one.    Past Medical History:  Diagnosis Date  . Anxiety   . CHF (congestive heart failure) ([place])   . COPD (chronic obstructive pulmonary disease) ([place])   . Lumbar herniated  disc   . Tobacco abuse      Family History  Problem Relation Age of Onset  . Liver cancer Mother   . Breast cancer Sister   . CAD Neg Hx      Past Surgical History:  Procedure Laterality Date  . LEFT HEART CATH AND CORONARY ANGIOGRAPHY N/A 02/05/2018   Procedure: LEFT HEART CATH AND CORONARY ANGIOGRAPHY;  Surgeon: Nelva Bush, MD;  Location: [facility];  Service: Cardiovascular;  Laterality: N/A;    Social History   Socioeconomic History  . Marital status: Widowed    Spouse name: Not on file  . Number  of children: Not on file  . Years of education: Not on file  . Highest education level: Not on file  Occupational History  . Not on file  Social Needs  . Financial resource strain: Not on file  . Food insecurity    Worry: Not on file    Inability: Not on file  . Transportation needs    Medical: Not on file    Non-medical: Not on file  Tobacco Use  . Smoking status: Former Smoker    Packs/day: 1.50    Years: 53.00    Pack years: 79.50    Start date: 05/24/1967    Quit date: 01/07/2018    Years since quitting: 0.8  . Smokeless tobacco: Never Used  . Tobacco comment: Smoked since age 61, quit 12/2017  Substance and Sexual Activity  . Alcohol use: Yes    Comment: Rare, glass of wine no more than every few weeks  . Drug use: Never  . Sexual activity: Not Currently  Lifestyle  . Physical activity    Days per week: Not on file    Minutes per session: Not on file  . Stress: Not on file  Relationships  . Social Herbalist on phone: Not on file    Gets together: Not on file    Attends religious service: Not on file    Active member of club or organization: Not on file    Attends meetings of clubs or organizations: Not on file    Relationship status: Not on file  . Intimate partner violence    Fear of current or ex partner: Not on file    Emotionally abused: Not on file    Physically abused: Not on file    Forced sexual activity: Not on file  Other Topics Concern  . Not on file  Social History Narrative  . Not on file     Allergies  Allergen Reactions  . Penicillins Swelling    Has patient had a PCN reaction causing immediate rash, facial/tongue/throat swelling, SOB or lightheadedness with hypotension: Yes Has patient had a PCN reaction causing severe rash involving mucus membranes or skin necrosis: No Has patient had a PCN reaction that required hospitalization: No Has patient had a PCN reaction occurring within the last 10 years: No If all of the above answers  are "NO", then may proceed with Cephalosporin use.      Outpatient Medications Prior to Visit  Medication Sig Dispense Refill  . ALPRAZolam (XANAX) 0.25 MG tablet Take 1 tablet by mouth 2 (two) times daily.  0  . aspirin EC 81 MG EC tablet Take 1 tablet (81 mg total) by mouth daily. 30 tablet 0  . benzonatate (TESSALON) 100 MG capsule Take 1 capsule (100 mg total) by mouth 3 (three) times daily as needed for cough. 60 capsule 0  .  budesonide-formoterol (SYMBICORT) 160-4.5 MCG/ACT inhaler Inhale 2 puffs into the lungs every 12 (twelve) hours. 1 Inhaler 0  . Cholecalciferol (VITAMIN D) 2000 units CAPS Take 1 capsule (2,000 Units total) by mouth daily. 30 capsule 5  . feeding supplement, ENSURE ENLIVE, (ENSURE ENLIVE) LIQD Take 237 mLs by mouth 2 (two) times daily between meals. 60 Bottle 0  . guaiFENesin (MUCINEX) 600 MG 12 hr tablet Take 1 tablet (600 mg total) by mouth 2 (two) times daily. 30 tablet 0  . HYDROcodone-acetaminophen (NORCO/VICODIN) 5-325 MG tablet Take 1 tablet by mouth 2 (two) times daily as needed for pain.  0  . metoprolol succinate (TOPROL-XL) 25 MG 24 hr tablet Take 0.5 tablets (12.5 mg total) by mouth daily. 60 tablet 0  . PROAIR HFA 108 (90 Base) MCG/ACT inhaler Inhale 2 puffs into the lungs 4 (four) times daily as needed for wheezing. 1 Inhaler 5  . Spacer/Aero-Holding Chambers (AEROCHAMBER MV) inhaler Use as instructed 1 each 0  . SPIRIVA RESPIMAT 2.5 MCG/ACT AERS INHALE 2 SPRAY(S) BY MOUTH ONCE DAILY 4 g 3  . SYMBICORT 160-4.5 MCG/ACT inhaler Inhale 2 puffs into the lungs 2 (two) times daily. 3 Inhaler 2  . Tiotropium Bromide Monohydrate (SPIRIVA RESPIMAT) 2.5 MCG/ACT AERS Inhale 2 puffs into the lungs daily. 1 Inhaler 0   No facility-administered medications prior to visit.     Review of Systems  Constitutional: Negative for chills, fever, malaise/fatigue and weight loss.  HENT: Negative for hearing loss, sore throat and tinnitus.   Eyes: Negative for blurred  vision and double vision.  Respiratory: Positive for shortness of breath. Negative for cough, hemoptysis, sputum production, wheezing and stridor.   Cardiovascular: Negative for chest pain, palpitations, orthopnea, leg swelling and PND.  Gastrointestinal: Negative for abdominal pain, constipation, diarrhea, heartburn, nausea and vomiting.  Genitourinary: Negative for dysuria, hematuria and urgency.  Musculoskeletal: Negative for joint pain and myalgias.  Skin: Negative for itching and rash.  Neurological: Negative for dizziness, tingling, weakness and headaches.  Endo/Heme/Allergies: Negative for environmental allergies. Does not bruise/bleed easily.  Psychiatric/Behavioral: Negative for depression. The patient is not nervous/anxious and does not have insomnia.   All other systems reviewed and are negative.    Objective:  Physical Exam Vitals signs reviewed.  Constitutional:      General: She is not in acute distress.    Appearance: She is well-developed.  HENT:     Head: Normocephalic and atraumatic.     Mouth/Throat:     Pharynx: No oropharyngeal exudate.  Eyes:     Conjunctiva/sclera: Conjunctivae normal.     Pupils: Pupils are equal, round, and reactive to light.  Neck:     Vascular: No JVD.     Trachea: No tracheal deviation.     Comments: Loss of supraclavicular fat Cardiovascular:     Rate and Rhythm: Normal rate and regular rhythm.     Heart sounds: S1 normal and S2 normal.     Comments: Distant heart tones Pulmonary:     Effort: No tachypnea or accessory muscle usage.     Breath sounds: No stridor. Decreased breath sounds (throughout all lung fields) present. No wheezing, rhonchi or rales.  Abdominal:     General: Bowel sounds are normal. There is no distension.     Palpations: Abdomen is soft.     Tenderness: There is no abdominal tenderness.  Musculoskeletal:        General: Deformity (muscle wasting ) present.  Skin:    General: Skin is  warm and dry.      Capillary Refill: Capillary refill takes less than 2 seconds.     Findings: No rash.  Neurological:     Mental Status: She is alert and oriented to person, place, and time.  Psychiatric:        Behavior: Behavior normal.      Vitals:   11/29/18 1433  BP: 132/78  Pulse: 96  SpO2: 99%  Weight: 140 lb 9.6 oz (63.8 kg)  Height: _0  (1.702 m)   99% on 3 L pulse POC BMI Readings from Last 3 Encounters:  11/29/18 22.02 kg/m  07/25/18 20.14 kg/m  06/21/18 20.27 kg/m   Wt Readings from Last 3 Encounters:  11/29/18 140 lb 9.6 oz (63.8 kg)  07/25/18 128 lb 9.6 oz (58.3 kg)  06/21/18 129 lb 6.4 oz (58.7 kg)     CBC    Component Value Date/Time   WBC 5.7 03/05/2018 0327   RBC 4.61 03/05/2018 0327   HGB 14.0 03/05/2018 0327   HCT 44.4 03/05/2018 0327   PLT 228 03/05/2018 0327   MCV 96.3 03/05/2018 0327   MCH 30.4 03/05/2018 0327   MCHC 31.5 03/05/2018 0327   RDW 12.9 03/05/2018 0327   LYMPHSABS 2.3 03/04/2018 2146   MONOABS 0.7 03/04/2018 2146   EOSABS 0.2 03/04/2018 2146   BASOSABS 0.1 03/04/2018 2146    Chest Imaging: 03/04/2018 chest x-ray: Emphysematous lung changes The patient's images have been independently reviewed by me.    Low-dose lung cancer screening CT completed in March 2020: Lung RADS 1, considered negative recommending annual 39-monthfollow-up.  This is already been ordered. Additionally there was some findings of bronchiectasis with no lower lobe evidence of mucoid impaction possible underlying atypical infection such as MAC.  The patient's images have been independently reviewed by me.    Pulmonary Functions Testing Results: PFT Results Latest Ref Rng & Units 03/30/2018  FVC-Pre L 2.11  FVC-Predicted Pre % 100  FVC-Post L 2.31  FVC-Predicted Post % 109  Pre FEV1/FVC % % 41  Post FEV1/FCV % % 48  FEV1-Pre L 0.87  FEV1-Predicted Pre % 55  FEV1-Post L 1.10  DLCO UNC% % 78  DLCO COR %Predicted % 59  TLC L 8.24  TLC % Predicted % 213  RV  % Predicted % 341   6MWT: SIX MIN WALK 06/06/2018  Medications Proair at 3pm, Aspirin 882mat 7:30, Vitamin D 2000 units, metoprolol 12.40m63mDulera 100, Spiriva 51m16mt 10am  Supplimental Oxygen during Test? (L/min) Yes  O2 Flow Rate 3  Type Continuous  Laps 9  Partial Lap (in Meters) 0  Baseline BP (sitting) 114/70  Baseline Heartrate 91  Baseline Dyspnea (Borg Scale) 3  Baseline Fatigue (Borg Scale) 3  Baseline SPO2 98  BP (sitting) 130/80  Heartrate 115  Dyspnea (Borg Scale) 9  Fatigue (Borg Scale) 4  SPO2 95  BP (sitting) 122/70  Heartrate 101  SPO2 98  Stopped or Paused before Six Minutes No  Distance Completed 306  Tech Comments: Pt walked at a normal pace without stopping during the walk. Pt denied any complaints.    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 2. Normal left ventricular filling pressure. 3. Low normal left ventricular filling pressure with subtle apical hypokinesis (LVEF 50-55%).    Assessment & Plan:  ICD-10-CM   1. Moderate COPD (chronic obstructive pulmonary disease) ([place])  J44.9 Ambulatory Referral for DME  2. Tobacco abuse, in remission  F17.201   3. Chronic hypoxemic respiratory failure ([facility])  J96.11   4. Stage 2 moderate COPD by GOLD classification ([place])  J44.9   5. Former smoker  Z87.891   61. Bronchiectasis without complication ([place])  D53.2     Discussion:  This is a 68 year old female with a long-term history of tobacco abuse since the age of 1.  She quit smoking back in October 2019.  She has a low BMI.  She does state that she has been watching her weight carefully and trying to stay level.  She has chronic hypoxemic respiratory failure on POC.  At this time she needs to continue using her daily vitamin D  supplementation. She can continue her current inhaler regimen to include Symbicort and Spiriva Respimat. We discussed the risk benefits and alternatives of proceeding with full nebulized therapy.  At this time she feels like that would be too much work for her.  She feels like the inhalers are working fine.  If we need to we will switch this in the future.  She is agreeable to that.  She has been enrolled in our lung cancer screening program.  She does have a repeat cancer screening that has already been ordered.  We gave her a new prescription to the DME supplier to help with a replacement POC.  Today in the office we discussed risk benefits and alternatives of becoming a durable DNR and completing a [place] form.  After our discussion in the office today she has made a decision to elect to fill these forms out.  We have done this for her.  A copy will be attached to her medical record.  She understands that she has a poor outcome if she was to need mechanical life support with her degree of lung disease.  18 minutes of her office visit was spent discussing and filling out the forms regarding her advanced care planning.  Greater than 50% of this patient's 5-minute of visit was spent face-to-face discussing above recommendations and treatment plan in addition to discussing advanced care planning as outlined above.    Current Outpatient Medications:  .  ALPRAZolam (XANAX) 0.25 MG tablet, Take 1 tablet by mouth 2 (two) times daily., Disp: , Rfl: 0 .  aspirin EC 81 MG EC tablet, Take 1 tablet (81 mg total) by mouth daily., Disp: 30 tablet, Rfl: 0 .  benzonatate (TESSALON) 100 MG capsule, Take 1 capsule (100 mg total) by mouth 3 (three) times daily as needed for cough., Disp: 60 capsule, Rfl: 0 .  budesonide-formoterol (SYMBICORT) 160-4.5 MCG/ACT inhaler, Inhale 2 puffs into the lungs every 12 (twelve) hours., Disp: 1 Inhaler, Rfl: 0 .  Cholecalciferol (VITAMIN D) 2000 units CAPS, Take 1  capsule (2,000 Units total) by mouth daily., Disp: 30 capsule, Rfl: 5 .  feeding supplement, ENSURE ENLIVE, (ENSURE ENLIVE) LIQD, Take 237 mLs by mouth 2 (two) times daily between meals., Disp: 60 Bottle, Rfl: 0 .  guaiFENesin (MUCINEX) 600 MG 12 hr tablet, Take 1 tablet (600 mg total) by mouth 2 (two) times daily., Disp: 30 tablet, Rfl: 0 .  HYDROcodone-acetaminophen (NORCO/VICODIN) 5-325 MG tablet, Take 1 tablet by mouth 2 (two) times daily as needed for pain., Disp: , Rfl: 0 .  metoprolol succinate (TOPROL-XL) 25 MG 24 hr tablet, Take 0.5 tablets (12.5 mg total) by mouth daily., Disp:  60 tablet, Rfl: 0 .  PROAIR HFA 108 (90 Base) MCG/ACT inhaler, Inhale 2 puffs into the lungs 4 (four) times daily as needed for wheezing., Disp: 1 Inhaler, Rfl: 5 .  Spacer/Aero-Holding Chambers (AEROCHAMBER MV) inhaler, Use as instructed, Disp: 1 each, Rfl: 0 .  SPIRIVA RESPIMAT 2.5 MCG/ACT AERS, INHALE 2 SPRAY(S) BY MOUTH ONCE DAILY, Disp: 4 g, Rfl: 3 .  SYMBICORT 160-4.5 MCG/ACT inhaler, Inhale 2 puffs into the lungs 2 (two) times daily., Disp: 3 Inhaler, Rfl: 2 .  Tiotropium Bromide Monohydrate (SPIRIVA RESPIMAT) 2.5 MCG/ACT AERS, Inhale 2 puffs into the lungs daily., Disp: 1 Inhaler, Rfl: 0   Garner Nash, DO [facility] 11/29/2018 3:04 PM

## 2018-11-29 NOTE — Addendum Note (Signed)
Addended by: Vivia Ewing on: 11/29/2018 03:14 PM   Modules accepted: Orders

## 2018-11-29 NOTE — Patient Instructions (Addendum)
Thank you for visiting Dr. Valeta Harms at [facility]. Today we recommend the following: Orders Placed This Encounter  Procedures  . Ambulatory Referral for DME   Continue your current inhaler regimen.  Please place your durable DNR form on the backside of your front door or your refrigerator.  Please consider purchasing a DNR medical bracelet or necklace.  If at some point you would like to switch to full nebulized medications please let our office know.  We can consider this in the future.  Please remember to wash her hands, weight 6 feet apart and wear a mask to reduce your risk of COVID-19.  Return in about 6 months (around 06/01/2019), or if symptoms worsen or fail to improve.

## 2018-12-19 ENCOUNTER — Ambulatory Visit: Payer: Medicare Other | Admitting: Pulmonary Disease

## 2019-03-14 ENCOUNTER — Telehealth: Payer: Self-pay | Admitting: Pulmonary Disease

## 2019-03-14 MED ORDER — SPIRIVA RESPIMAT 2.5 MCG/ACT IN AERS: 2.0000 | INHALATION_SPRAY | Freq: Every day | RESPIRATORY_TRACT | 1 refills | Status: DC

## 2019-03-14 NOTE — Telephone Encounter (Signed)
Pt called back regarding Spireva.  Pharmacy told pt to have 3 mth supply called in.  [phone] or [phone].

## 2019-03-14 NOTE — Telephone Encounter (Signed)
Rx was sent  Pt aware  Nothing further needed

## 2019-04-15 ENCOUNTER — Encounter: Payer: Self-pay | Admitting: Internal Medicine

## 2019-06-17 ENCOUNTER — Encounter: Payer: Self-pay | Admitting: Internal Medicine

## 2019-06-17 ENCOUNTER — Telehealth: Payer: Self-pay | Admitting: Internal Medicine

## 2019-06-17 ENCOUNTER — Ambulatory Visit (INDEPENDENT_AMBULATORY_CARE_PROVIDER_SITE_OTHER): Payer: Medicare Other | Admitting: Internal Medicine

## 2019-06-17 ENCOUNTER — Other Ambulatory Visit: Payer: Self-pay

## 2019-06-17 VITALS — BP 122/78 | HR 86 | Ht 68.0 in | Wt 151.2 lb

## 2019-06-17 DIAGNOSIS — J449 Chronic obstructive pulmonary disease, unspecified: Secondary | ICD-10-CM

## 2019-06-17 DIAGNOSIS — I5181 Takotsubo syndrome: Secondary | ICD-10-CM

## 2019-06-17 DIAGNOSIS — E782 Mixed hyperlipidemia: Secondary | ICD-10-CM

## 2019-06-17 DIAGNOSIS — I2510 Atherosclerotic heart disease of native coronary artery without angina pectoris: Secondary | ICD-10-CM | POA: Diagnosis not present

## 2019-06-17 IMAGING — CT CT CHEST LUNG CANCER SCREENING LOW DOSE W/O CM
1 of 3 series · 10 of 30 positions shown, 13 images · non-contrast
Comparison: Chest radiograph 03/04/2018.  No prior CT.

CLINICAL DATA: Ex-smoker, quitting last year. Fifty-three pack-year
history.

EXAM:
CT CHEST WITHOUT CONTRAST LOW-DOSE FOR LUNG CANCER SCREENING
TECHNIQUE: Multidetector CT imaging of the chest was performed following the
standard protocol without IV contrast.

[ct lung segmentation data · axial · 0.60mm/px · z∈[-306,-306]mm · 10 of 338 frames shown]
[frame 1/338  mediastinal]
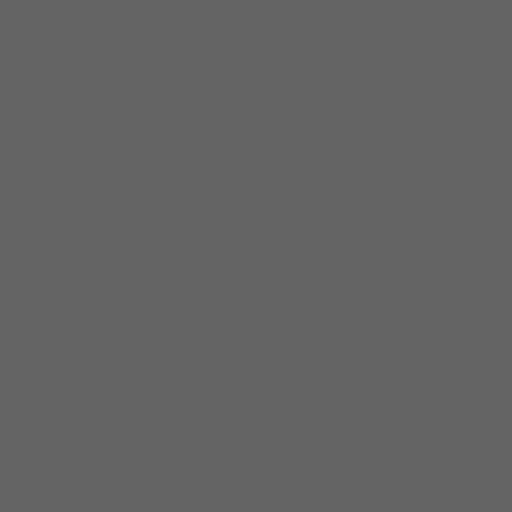
[frame 1/338  lung]
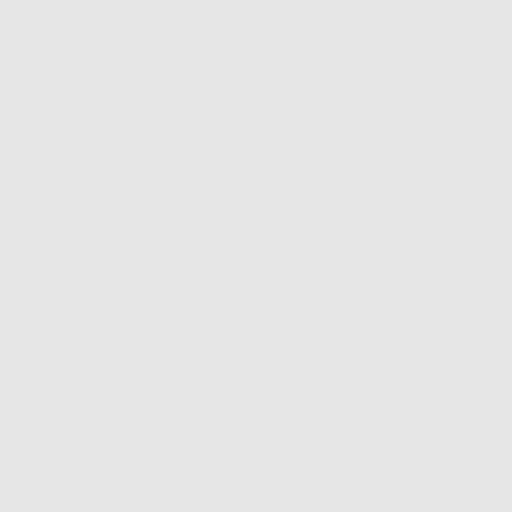
[frame 38/338  lung]
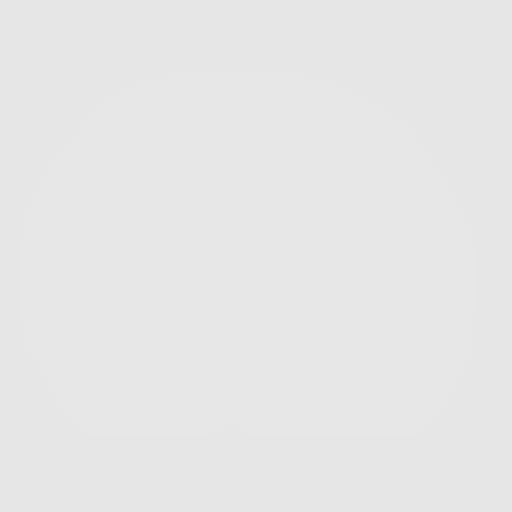
[frame 75/338  lung]
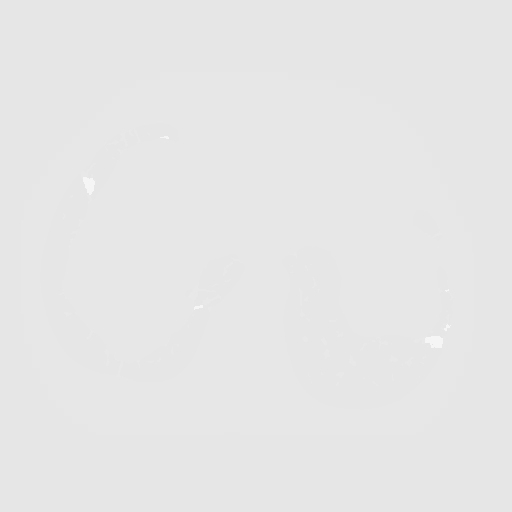
[frame 113/338  lung]
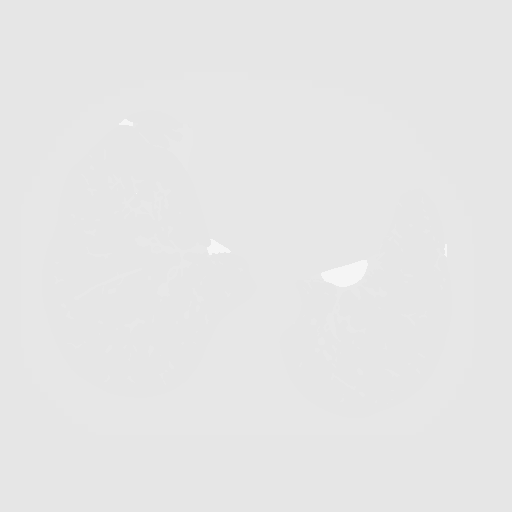
[frame 150/338  mediastinal]
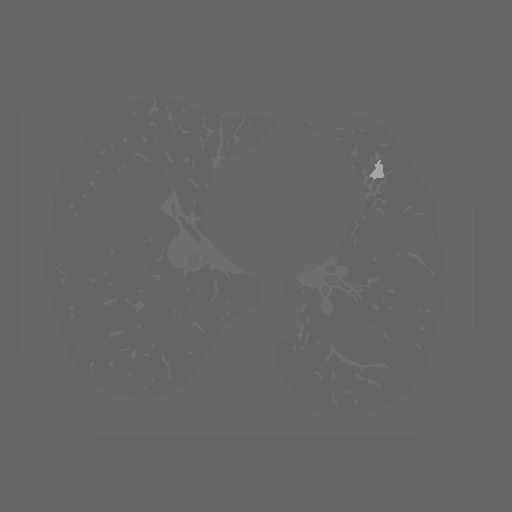
[frame 150/338  lung]
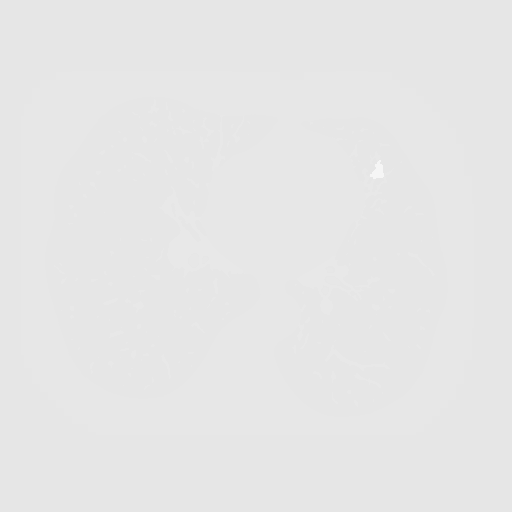
[frame 188/338  lung]
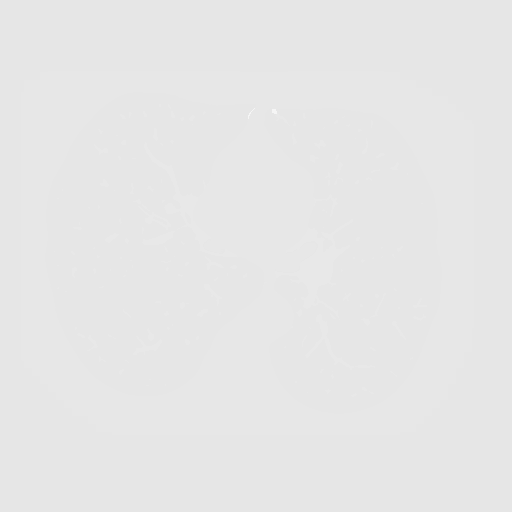
[frame 225/338  lung]
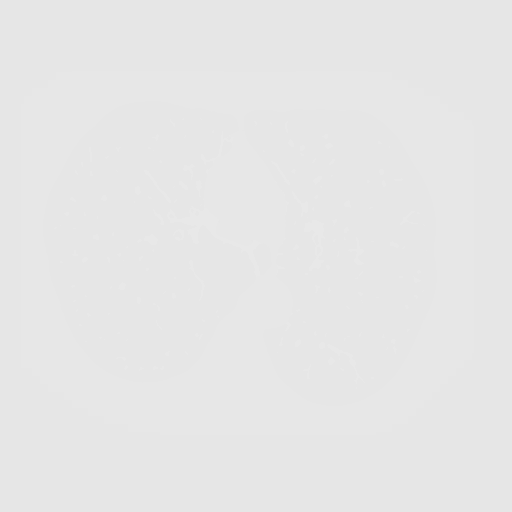
[frame 263/338  lung]
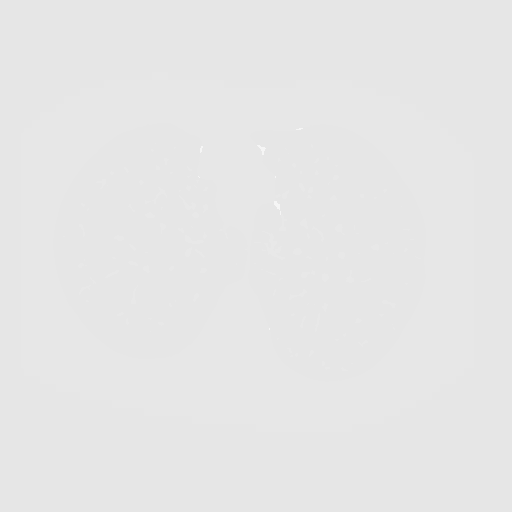
[frame 300/338  mediastinal]
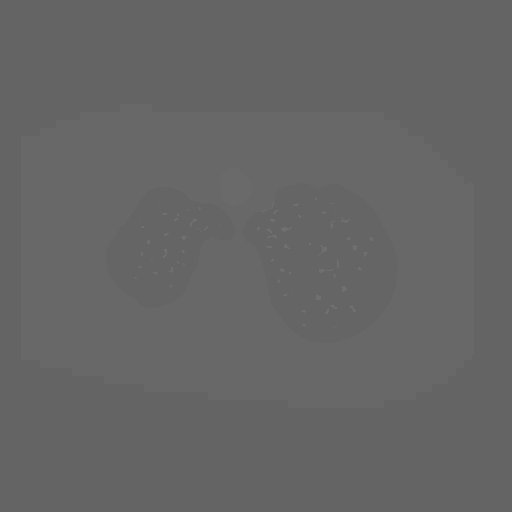
[frame 300/338  lung]
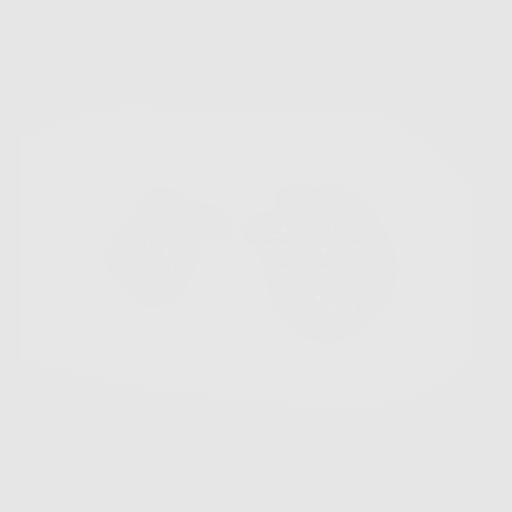
[frame 338/338  lung]
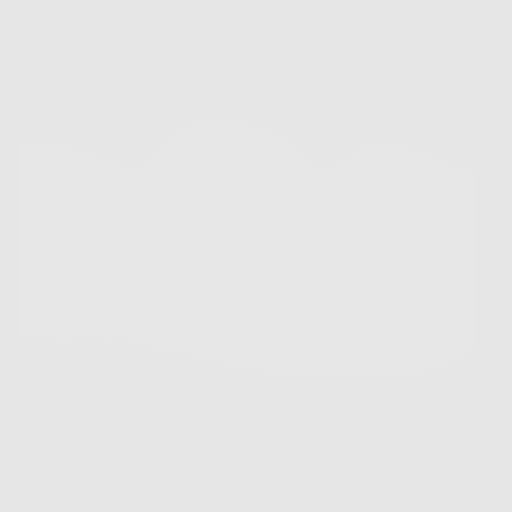

[10 of 30 positions shown; findings below may reference images not displayed]

FINDINGS: Cardiovascular: Aortic and branch vessel atherosclerosis. Normal
heart size with small pericardial effusion, likely physiologic. Lad
coronary artery calcification.

Mediastinum/Nodes: No mediastinal or definite hilar adenopathy,
given limitations of unenhanced CT.

Lungs/Pleura: No pleural fluid. Subpleural opacities at both lung
bases are greater in the lateral left lower lobe. Right middle lobe
and lingular volume loss with consolidation and primarily right
middle lobe bronchiectasis. More mild areas of bronchial wall
thickening and mucoid impaction involving the anterior lower lobes.

Moderate centrilobular emphysema.

Minimal motion degradation inferiorly.

No suspicious pulmonary nodule or mass.

Upper Abdomen: Normal imaged portions of the liver, spleen, stomach,
pancreas, gallbladder, adrenal glands, mild renal cortical thinning
bilaterally. Fetal lobulation involves the upper pole right kidney.

Musculoskeletal: No acute osseous abnormality.
IMPRESSION: 1. Lung-RADS 1, negative. Continue annual screening with low-dose
chest CT without contrast in 12 months.
2. Lower lung predominant areas of volume loss, consolidation,
bronchiectasis, and mucoid impaction. Constellation of findings
favors chronic atypical infection, likely mycobacterium avium
intracellular.
3. Aortic atherosclerosis ([id number]), coronary artery
atherosclerosis and emphysema ([id number].I).

## 2019-06-17 NOTE — Patient Instructions (Addendum)
Medication Instructions:  NO CHANGE OK to stop fish oil  *If you need a refill on your cardiac medications before your next appointment, please call your pharmacy*  Follow-Up: At [facility], you and your health needs are our priority.  As part of our continuing mission to provide you with exceptional heart care, we have created designated Provider Care Teams.  These Care Teams include your primary Cardiologist (physician) and Advanced Practice Providers (APPs -  Physician Assistants and Nurse Practitioners) who all work together to provide you with the care you need, when you need it.  Your next appointment:   12 month(s)  The format for your next appointment:   In Person  Provider:   Dr. Rennis Golden  Other Instructions

## 2019-06-17 NOTE — Progress Notes (Signed)
OFFICE NOTE  Chief Complaint:  Hospital follow-up  Primary Care Physician: Vickie Most, FNP  HPI:  Vickie Bowman is a 69 y.o. female with a past medial history significant for hospitalization in September for acute dyspnea.  She was admitted for COPD exacerbation was found to have elevated troponin of 1.55.  Cardiology was consulted and echo showed an EF 35 to 40%.  I recommended heart catheterization for her.  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  LVEF at the time was up to 50 to 55%.  Was subsequently seen by Azalee Course, PA-C, and follow-up and a limited echo was ordered.  She was noted to be on atorvastatin 40 mg daily.  A fasting lipid profile LFTs were also ordered.  Recently the labs resulted and she did have marked improvement in her lipid profile with a total cholesterol 143 and LDL of 52, decreased from 709.  Unfortunately, she had an acute rise in AST and ALT to 97 and 185 respectively from 32 and 25.  This suggest there may be underlying steatohepatitis.  She remains asymptomatic with respect to that.  She is followed in pulmonary has an upcoming appointment in 2 weeks.  She is noted to be on oxygen.  Otherwise besides shortness of breath denies any chest pain.  06/17/2019  Vickie Bowman returns today for follow-up.  Her main issues continue to be with COPD.  She is on oxygen but has been off of cigarettes for about 2 years.  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  I advised long-term beta-blocker as well as low-dose aspirin.  She did have some coronary disease and was on atorvastatin however had elevated liver enzymes.  Based on that it was discontinued.  Since then her cholesterol apparently is gone up however I do not have access to those records at this time.  We will request those from her primary care provider.  PMHx:  Past Medical History:  Diagnosis Date  . Anxiety   . CHF  (congestive heart failure) (HCC)   . COPD (chronic obstructive pulmonary disease) (HCC)   . Lumbar herniated disc   . Tobacco abuse     Past Surgical History:  Procedure Laterality Date  . LEFT HEART CATH AND CORONARY ANGIOGRAPHY N/A 02/05/2018   Procedure: LEFT HEART CATH AND CORONARY ANGIOGRAPHY;  Surgeon: Yvonne Kendall, MD;  Location: [facility];  Service: Cardiovascular;  Laterality: N/A;    FAMHx:  Family History  Problem Relation Age of Onset  . Liver cancer Mother   . Breast cancer Sister   . CAD Neg Hx     SOCHx:   reports that she quit smoking about 17 months ago. She started smoking about 52 years ago. She has a 79.50 pack-year smoking history. She has never used smokeless tobacco. She reports current alcohol use. She reports that she does not use drugs.  ALLERGIES:  Allergies  Allergen Reactions  . Penicillins Swelling    Has patient had a PCN reaction causing immediate rash, facial/tongue/throat swelling, SOB or lightheadedness with hypotension: Yes Has patient had a PCN reaction causing severe rash involving mucus membranes or skin necrosis: No Has patient had a PCN reaction that required hospitalization: No Has patient had a PCN reaction occurring within the last 10 years: No If all of the above answers are "NO", then may proceed with Cephalosporin use.     ROS: Pertinent items  noted in HPI and remainder of comprehensive ROS otherwise negative.  HOME MEDS: Current Outpatient Medications on File Prior to Visit  Medication Sig Dispense Refill  . ALPRAZolam (XANAX) 0.25 MG tablet Take 1 tablet by mouth 2 (two) times daily.  0  . aspirin EC 81 MG EC tablet Take 1 tablet (81 mg total) by mouth daily. 30 tablet 0  . budesonide-formoterol (SYMBICORT) 160-4.5 MCG/ACT inhaler Inhale 2 puffs into the lungs every 12 (twelve) hours. 1 Inhaler 0  . Cholecalciferol (VITAMIN D) 2000 units CAPS Take 1 capsule (2,000 Units total) by mouth daily. 30 capsule 5  .  HYDROcodone-acetaminophen (NORCO/VICODIN) 5-325 MG tablet Take 1 tablet by mouth 2 (two) times daily as needed for pain.  0  . metoprolol succinate (TOPROL-XL) 25 MG 24 hr tablet Take 0.5 tablets (12.5 mg total) by mouth daily. 60 tablet 0  . PROAIR HFA 108 (90 Base) MCG/ACT inhaler Inhale 2 puffs into the lungs 4 (four) times daily as needed for wheezing. 1 Inhaler 5  . Spacer/Aero-Holding Chambers (AEROCHAMBER MV) inhaler Use as instructed 1 each 0  . SPIRIVA RESPIMAT 2.5 MCG/ACT AERS INHALE 2 SPRAY(S) BY MOUTH ONCE DAILY 4 g 3  . Tiotropium Bromide Monohydrate (SPIRIVA RESPIMAT) 2.5 MCG/ACT AERS Inhale 2 puffs into the lungs daily. 12 g 1   No current facility-administered medications on file prior to visit.    LABS/IMAGING: No results found for this or any previous visit (from the past 48 hour(s)). No results found.  LIPID PANEL:    Component Value Date/Time   CHOL 143 04/24/2018 1010   TRIG 70 04/24/2018 1010   HDL 77 04/24/2018 1010   CHOLHDL 1.9 04/24/2018 1010   CHOLHDL 3.0 02/02/2018 0624   VLDL 9 02/02/2018 0624   LDLCALC 52 04/24/2018 1010     WEIGHTS: Wt Readings from Last 3 Encounters:  06/17/19 151 lb 3.2 oz (68.6 kg)  11/29/18 140 lb 9.6 oz (63.8 kg)  07/25/18 128 lb 9.6 oz (58.3 kg)    VITALS: BP 122/78   Pulse 86   Ht 5\' 8"  (1.727 m)   Wt 151 lb 3.2 oz (68.6 kg)   SpO2 97%   BMI 22.99 kg/m   EXAM: General appearance: alert, no distress and On oxygen Neck: no carotid bruit, no JVD and thyroid not enlarged, symmetric, no tenderness/mass/nodules Lungs: diminished breath sounds bilaterally and rhonchi bilaterally Heart: regular rate and rhythm, S1, S2 normal, no murmur, click, rub or gallop Abdomen: soft, non-tender; bowel sounds normal; no masses,  no organomegaly and Scaphoid Extremities: extremities normal, atraumatic, no cyanosis or edema Pulses: 2+ and symmetric Skin: Skin color, texture, turgor normal. No rashes or lesions Neurologic: Grossly  normal : Pleasant  EKG: Normal sinus rhythm 86-personally reviewed  ASSESSMENT: 1. History of acute systolic congestive heart failure, LVEF 35 to 40%-improved to 55 to 60% (05/2018) 2. Mild nonobstructive coronary disease (01/2018) 3. COPD on oxygen  4. Elevated LFTs on statin  PLAN: 1.   Mrs. Ahuja continues to be asymptomatic from a heart failure standpoint.  She is on oxygen however COPD is stable.  Fortunately she stopped smoking a couple years ago.  LVEF had normalized.  Her cholesterol apparently is risen but she had elevated LFTs on atorvastatin.  I like to review her Bowman recent numbers and can advise an alternative statin or treatment which may be easier on her liver.  She was advised to go on to fish oil by her primary care provider however there is  no evidence of cardiovascular benefit and secondary prevention of coronary disease with this recommendation.  I advised her to discontinue it.  Follow-up in 1 year.  Pixie Casino, MD, [facility], [place] Director of the [facility]  [facility] Diplomate of the American Board of Clinical Lipidology Attending Cardiologist  Direct Dial: [phone]  Fax: [phone]  Website:  [email or web address]   Nadean Corwin Dashana Guizar 06/17/2019, 2:15 PM

## 2019-06-17 NOTE — Telephone Encounter (Signed)
Patient is calling requesting the person that will be giving her a ride to her appointment scheduled for today 06/17/19 at 2:15 PM ride the elevator up with her due to the patient being terrified of them. She states they can return to the care immediatly after. Please advise.

## 2019-06-17 NOTE — Telephone Encounter (Signed)
Returned call-advised ok to ride elevator with her but unable to come in office for appt.  Patient aware and verbalized understanding

## 2019-06-18 ENCOUNTER — Telehealth: Payer: Self-pay | Admitting: Internal Medicine

## 2019-06-18 DIAGNOSIS — E785 Hyperlipidemia, unspecified: Secondary | ICD-10-CM

## 2019-06-18 NOTE — Telephone Encounter (Signed)
Received faxed lab results from PCP 03/2019 labs TC: 241 HDL: 56 TG: 140 LDL: 159  ** LDL 1 year ago was 52  Per MD, add crestor 20mg  daily LFTs in 2 weeks

## 2019-06-21 MED ORDER — ROSUVASTATIN CALCIUM 20 MG PO TABS: 20.0000 mg | ORAL_TABLET | Freq: Every day | ORAL | 3 refills | Status: DC

## 2019-06-21 NOTE — Telephone Encounter (Signed)
Left message to call back to discuss notes below

## 2019-06-21 NOTE — Telephone Encounter (Signed)
Patient called. Aware of MD recommendation. Rx sent to pharmacy. LFT lab order mailed

## 2019-06-21 NOTE — Addendum Note (Signed)
Addended by: Lindell Spar on: 06/21/2019 11:52 AM   Modules accepted: Orders

## 2019-06-26 ENCOUNTER — Other Ambulatory Visit: Payer: Self-pay

## 2019-06-26 ENCOUNTER — Ambulatory Visit (INDEPENDENT_AMBULATORY_CARE_PROVIDER_SITE_OTHER): Payer: Medicare Other | Admitting: Pulmonary Disease

## 2019-06-26 ENCOUNTER — Encounter: Payer: Self-pay | Admitting: Pulmonary Disease

## 2019-06-26 VITALS — BP 110/78 | HR 76 | Temp 97.8°F | Ht 68.0 in | Wt 154.0 lb

## 2019-06-26 DIAGNOSIS — J9611 Chronic respiratory failure with hypoxia: Secondary | ICD-10-CM

## 2019-06-26 DIAGNOSIS — J449 Chronic obstructive pulmonary disease, unspecified: Secondary | ICD-10-CM

## 2019-06-26 DIAGNOSIS — R636 Underweight: Secondary | ICD-10-CM | POA: Diagnosis not present

## 2019-06-26 DIAGNOSIS — J479 Bronchiectasis, uncomplicated: Secondary | ICD-10-CM | POA: Diagnosis not present

## 2019-06-26 MED ORDER — BREZTRI AEROSPHERE 160-9-4.8 MCG/ACT IN AERO: 2.0000 | INHALATION_SPRAY | Freq: Two times a day (BID) | RESPIRATORY_TRACT | 12 refills | Status: DC

## 2019-06-26 MED ORDER — BREZTRI AEROSPHERE 160-9-4.8 MCG/ACT IN AERO: 2.0000 | INHALATION_SPRAY | Freq: Two times a day (BID) | RESPIRATORY_TRACT | 0 refills | Status: DC

## 2019-06-26 NOTE — Progress Notes (Signed)
Synopsis: Referred in November 2019 for COPD by Tamsen Roers, MD  Subjective:   PATIENT ID: Vickie Bowman GENDER: female DOB: February 15, 1951, MRN: [id number]  Chief Complaint  Patient presents with  . Follow-up    f/u COPD. Patient stated her breathing is the same as last visit no change.     PMH of CHF, tobacco abuse, quit smoking Aug 2019. Smoked since age 69 years.  Patient was admitted to the hospital in September 2019 as well as in October 2019 for COPD exacerbations.  The patient's most recent hospitalization for COPD she developed hypoxemic respiratory failure requiring nasal cannula supplementation.  Initial echocardiogram revealed a reduced ejection fraction and she was subsequently taken for a left heart cath in September 2019 which revealed nonobstructive CAD.  The patient was treated in the emergency room with CPAP, steroids, magnesium, albuterol.  Hospital course was relatively uncomplicated and the patient was discharged home on oxygen therapy.  Since her hospitalization she has been overall been doing well.  She does have significant dyspnea on exertion.  She feels like she has slowly been recovering.  She does acknowledge that she has been able to quit smoking.  She is currently on 3 L pulse from a POC.  She states that she has been trying to eat.  She does know that her BMI is low.  Patient denies chest pain, hemoptysis, daily sputum production.  OV 06/06/2018: She feels like she is a little congested. This has been getting worse throughout the past week. Cough is much worse. Not really bringing anything up at this time. She trys but it feels stuck.  Patient denies any fevers.  She has woke up in the middle the night with ongoing cough for the past several nights.  She does get up in the middle of the night and feel like her chest is more tight.  She has been using her albuterol more over the past couple of days.  Does not have any significant sputum production.  OV 11/29/2018: Here  today for follow-up for management of severe COPD.  She has oxygen dependent respiratory failure on oxygen concentrator today in the office at 3 L.  Patient currently managed well on Symbicort and Spiriva Respimat.  She likes her inhaler regimen.  She does notice that has made her breathe better.  Today in the office we discussed her goals of care.  She states that she remembers distinctly seeing her mother received cardiopulmonary resuscitation.  She would not ever want this to occur to her.  She understands that at some point she may get to the point where she is unable to breathe.  And would not want to be placed on mechanical life support.  We discussed all of this to include risk, benefits and alternatives in detail today in the office.  She is elected to proceed with filling out durable DNR forms today in the office.  As for her ongoing respiratory symptoms she wants to continue the use of her inhalers as long as possible.  We discussed the alternatives of using nebulized therapy as well.  She states that they also has trouble with her current POC machine.  She discussed this with her provider DME provider.  They need a new prescription to help with replacing the old one.  OV 06/26/2019: Patient seen today for follow-up regarding advanced COPD.  Last office visit she completed durable DNR forms in [place] form.  At this point she is doing well on Symbicort  plus Spiriva.  She is very anxious about ever having to go back in the hospital she would like to avoid this at all cost.  Otherwise from a respiratory standpoint she does have shortness of breath with exertion.  But she is still using her oxygen and trying to stay active as much as possible.  Her weight has been stable.  She denies night sweats fevers weight loss chills.  Patient denies hemoptysis    Past Medical History:  Diagnosis Date  . Anxiety   . CHF (congestive heart failure) ([place])   . COPD (chronic obstructive pulmonary disease)  ([place])   . Lumbar herniated disc   . Tobacco abuse      Family History  Problem Relation Age of Onset  . Liver cancer Mother   . Breast cancer Sister   . CAD Neg Hx      Past Surgical History:  Procedure Laterality Date  . LEFT HEART CATH AND CORONARY ANGIOGRAPHY N/A 02/05/2018   Procedure: LEFT HEART CATH AND CORONARY ANGIOGRAPHY;  Surgeon: Nelva Bush, MD;  Location: [facility];  Service: Cardiovascular;  Laterality: N/A;    Social History   Socioeconomic History  . Marital status: Widowed    Spouse name: Not on file  . Number of children: Not on file  . Years of education: Not on file  . Highest education level: Not on file  Occupational History  . Not on file  Tobacco Use  . Smoking status: Former Smoker    Packs/day: 1.50    Years: 53.00    Pack years: 79.50    Start date: 05/24/1967    Quit date: 01/07/2018    Years since quitting: 1.4  . Smokeless tobacco: Never Used  . Tobacco comment: Smoked since age 69, quit 12/2017  Substance and Sexual Activity  . Alcohol use: Yes    Comment: Rare, glass of wine no more than every few weeks  . Drug use: Never  . Sexual activity: Not Currently  Other Topics Concern  . Not on file  Social History Narrative  . Not on file   Social Determinants of Health   Financial Resource Strain:   . Difficulty of Paying Living Expenses: Not on file  Food Insecurity:   . Worried About Charity fundraiser in the Last Year: Not on file  . Ran Out of Food in the Last Year: Not on file  Transportation Needs:   . Lack of Transportation (Medical): Not on file  . Lack of Transportation (Non-Medical): Not on file  Physical Activity:   . Days of Exercise per Week: Not on file  . Minutes of Exercise per Session: Not on file  Stress:   . Feeling of Stress : Not on file  Social Connections:   . Frequency of Communication with Friends and Family: Not on file  . Frequency of Social Gatherings with Friends and Family: Not on file    . Attends Religious Services: Not on file  . Active Member of Clubs or Organizations: Not on file  . Attends Archivist Meetings: Not on file  . Marital Status: Not on file  Intimate Partner Violence:   . Fear of Current or Ex-Partner: Not on file  . Emotionally Abused: Not on file  . Physically Abused: Not on file  . Sexually Abused: Not on file     Allergies  Allergen Reactions  . Penicillins Swelling    Has patient had a PCN reaction causing immediate rash,  facial/tongue/throat swelling, SOB or lightheadedness with hypotension: Yes Has patient had a PCN reaction causing severe rash involving mucus membranes or skin necrosis: No Has patient had a PCN reaction that required hospitalization: No Has patient had a PCN reaction occurring within the last 10 years: No If all of the above answers are "NO", then may proceed with Cephalosporin use.      Outpatient Medications Prior to Visit  Medication Sig Dispense Refill  . ALPRAZolam (XANAX) 0.25 MG tablet Take 1 tablet by mouth 2 (two) times daily.  0  . aspirin EC 81 MG EC tablet Take 1 tablet (81 mg total) by mouth daily. 30 tablet 0  . budesonide-formoterol (SYMBICORT) 160-4.5 MCG/ACT inhaler Inhale 2 puffs into the lungs every 12 (twelve) hours. 1 Inhaler 0  . Cholecalciferol (VITAMIN D) 2000 units CAPS Take 1 capsule (2,000 Units total) by mouth daily. 30 capsule 5  . HYDROcodone-acetaminophen (NORCO/VICODIN) 5-325 MG tablet Take 1 tablet by mouth 2 (two) times daily as needed for pain.  0  . metoprolol succinate (TOPROL-XL) 25 MG 24 hr tablet Take 0.5 tablets (12.5 mg total) by mouth daily. 60 tablet 0  . PROAIR HFA 108 (90 Base) MCG/ACT inhaler Inhale 2 puffs into the lungs 4 (four) times daily as needed for wheezing. 1 Inhaler 5  . rosuvastatin (CRESTOR) 20 MG tablet Take 1 tablet (20 mg total) by mouth daily. 90 tablet 3  . Spacer/Aero-Holding Chambers (AEROCHAMBER MV) inhaler Use as instructed 1 each 0  . SPIRIVA  RESPIMAT 2.5 MCG/ACT AERS INHALE 2 SPRAY(S) BY MOUTH ONCE DAILY 4 g 3  . Tiotropium Bromide Monohydrate (SPIRIVA RESPIMAT) 2.5 MCG/ACT AERS Inhale 2 puffs into the lungs daily. 12 g 1   No facility-administered medications prior to visit.    Review of Systems  Constitutional: Negative for chills, fever, malaise/fatigue and weight loss.  HENT: Negative for hearing loss, sore throat and tinnitus.   Eyes: Negative for blurred vision and double vision.  Respiratory: Positive for shortness of breath and wheezing. Negative for cough, hemoptysis, sputum production and stridor.   Cardiovascular: Negative for chest pain, palpitations, orthopnea, leg swelling and PND.  Gastrointestinal: Negative for abdominal pain, constipation, diarrhea, heartburn, nausea and vomiting.  Genitourinary: Negative for dysuria, hematuria and urgency.  Musculoskeletal: Negative for joint pain and myalgias.  Skin: Negative for itching and rash.  Neurological: Negative for dizziness, tingling, weakness and headaches.  Endo/Heme/Allergies: Negative for environmental allergies. Does not bruise/bleed easily.  Psychiatric/Behavioral: Negative for depression. The patient is not nervous/anxious and does not have insomnia.   All other systems reviewed and are negative.    Objective:  Physical Exam Vitals reviewed.  Constitutional:      General: She is not in acute distress.    Appearance: She is well-developed.  HENT:     Head: Normocephalic and atraumatic.     Mouth/Throat:     Pharynx: No oropharyngeal exudate.  Eyes:     Conjunctiva/sclera: Conjunctivae normal.     Pupils: Pupils are equal, round, and reactive to light.  Neck:     Vascular: No JVD.     Trachea: No tracheal deviation.     Comments: Loss of supraclavicular fat Cardiovascular:     Rate and Rhythm: Normal rate and regular rhythm.     Heart sounds: S1 normal and S2 normal.     Comments: Distant heart tones Pulmonary:     Effort: No tachypnea or  accessory muscle usage.     Breath sounds: No stridor.  Decreased breath sounds (throughout all lung fields) present. No wheezing, rhonchi or rales.     Comments: Bilateral diminshed breath sounds   Abdominal:     General: Bowel sounds are normal. There is no distension.     Palpations: Abdomen is soft.     Tenderness: There is no abdominal tenderness.  Musculoskeletal:        General: Deformity (muscle wasting ) present.  Skin:    General: Skin is warm and dry.     Capillary Refill: Capillary refill takes less than 2 seconds.     Findings: No rash.  Neurological:     Mental Status: She is alert and oriented to person, place, and time.  Psychiatric:        Behavior: Behavior normal.      Vitals:   06/26/19 1404  BP: 110/78  Pulse: 76  Temp: 97.8 F (36.6 C)  TempSrc: Temporal  SpO2: 97%  Weight: 154 lb (69.9 kg)  Height: _0  (1.727 m)   97% on 3 L pulse POC BMI Readings from Last 3 Encounters:  06/26/19 23.42 kg/m  06/17/19 22.99 kg/m  11/29/18 22.02 kg/m   Wt Readings from Last 3 Encounters:  06/26/19 154 lb (69.9 kg)  06/17/19 151 lb 3.2 oz (68.6 kg)  11/29/18 140 lb 9.6 oz (63.8 kg)     CBC    Component Value Date/Time   WBC 5.7 03/05/2018 0327   RBC 4.61 03/05/2018 0327   HGB 14.0 03/05/2018 0327   HCT 44.4 03/05/2018 0327   PLT 228 03/05/2018 0327   MCV 96.3 03/05/2018 0327   MCH 30.4 03/05/2018 0327   MCHC 31.5 03/05/2018 0327   RDW 12.9 03/05/2018 0327   LYMPHSABS 2.3 03/04/2018 2146   MONOABS 0.7 03/04/2018 2146   EOSABS 0.2 03/04/2018 2146   BASOSABS 0.1 03/04/2018 2146    Chest Imaging: 03/04/2018 chest x-ray: Emphysematous lung changes The patient's images have been independently reviewed by me.    Low-dose lung cancer screening CT completed in March 2020: Lung RADS 1, considered negative recommending annual 48-monthfollow-up.  This is already been ordered. Additionally there was some findings of bronchiectasis with no lower lobe  evidence of mucoid impaction possible underlying atypical infection such as MAC.  The patient's images have been independently reviewed by me.    Pulmonary Functions Testing Results: PFT Results Latest Ref Rng & Units 03/30/2018  FVC-Pre L 2.11  FVC-Predicted Pre % 100  FVC-Post L 2.31  FVC-Predicted Post % 109  Pre FEV1/FVC % % 41  Post FEV1/FCV % % 48  FEV1-Pre L 0.87  FEV1-Predicted Pre % 55  FEV1-Post L 1.10  DLCO UNC% % 78  DLCO COR %Predicted % 59  TLC L 8.24  TLC % Predicted % 213  RV % Predicted % 341   6MWT: SIX MIN WALK 06/06/2018  Medications Proair at 3pm, Aspirin 839mat 7:30, Vitamin D 2000 units, metoprolol 12.32m58mDulera 100, Spiriva 30m68mt 10am  Supplimental Oxygen during Test? (L/min) Yes  O2 Flow Rate 3  Type Continuous  Laps 9  Partial Lap (in Meters) 0  Baseline BP (sitting) 114/70  Baseline Heartrate 91  Baseline Dyspnea (Borg Scale) 3  Baseline Fatigue (Borg Scale) 3  Baseline SPO2 98  BP (sitting) 130/80  Heartrate 115  Dyspnea (Borg Scale) 9  Fatigue (Borg Scale) 4  SPO2 95  BP (sitting) 122/70  Heartrate 101  SPO2 98  Stopped or Paused before Six Minutes No  Distance Completed  306  Tech Comments: Pt walked at a normal pace without stopping during the walk. Pt denied any complaints.    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 2. Normal left ventricular filling pressure. 3. Low normal left ventricular filling pressure with subtle apical hypokinesis (LVEF 50-55%).    Assessment & Plan:     ICD-10-CM   1. Stage 2 moderate COPD by GOLD classification ([place])  J44.9 Amb Referral to Palliative Care  2. Chronic hypoxemic respiratory failure (H[facility])  J96.11 Amb Referral to Palliative Care  3. Moderate COPD (chronic obstructive  pulmonary disease) (H[facility])  J44.9   4. Bronchiectasis without complication ([place])  Z02.5   5. Underweight due to inadequate caloric intake  R63.6 Amb Referral to Palliative Care    Discussion:  This is a 69 year old female with a longstanding history of smoking since age 59 she quit in October 2019 following a hospitalization after a severe exacerbation.  She has a low BMI.  She has chronic hypoxemic respiratory failure on pulse oxygen at 3 L.  She still has progressive dyspnea symptoms.  All of this related to her severe COPD.  She has made the decision that she would prefer to be a DNR and has completed a [facility] form.  She would like to avoid hospitalizations again if at all cost.  After further discussion of this today with the patient I believe that it would be appropriate for evaluation by outpatient palliative care.  We discussed this again in detail.  She thinks it is a good idea to have somebody to be able to come and help check on her and see if she is doing okay at home.  Plan Following Extensive Data Review & Interpretation:  . I reviewed prior external note(s) from 06/17/2019 cardiology Dr. Debara Pickett, history of chronic systolic heart failure, improved LVEF in June 11 2053 to 60% mild nonobstructive CAD.  Recommend 1 year follow-up . I reviewed the result(s) of echocardiogram 05/30/2018, improved LVEF, approximately 55 to 60%, moderate TR mild elevation in pulmonary artery pressures.  Peak pulmonary artery pressure 34 mmHg. . I have ordered referral to outpatient palliative care, we will give patient samples today of Breztri inhaler with a new prescription.  At this point she can also continue use of her Symbicort plus Spiriva after she is trialed of the new inhaler.  But she would like to try it just to see if it makes any difference before going back to her old regimen.  Independent interpretation of tests . Review of patient's 07/25/2018 low-dose lung cancer screening CT images  revealed lung RADS 1, evidence of atypical infection potentially MAI scattered bronchiectasis. The patient's images have been independently reviewed by me.  Discussed with patient today in the office  Discussed the utility of screening with advanced lung disease.  Patient is still rather adamant about continuing her program.  I do believe that this is reasonable as she would potentially tolerate SBRT if she was to develop a lesion that was concerning for malignancy.  She should have a repeat LDCT screening in March 2021.  This has been ordered.  Return to clinic in 6 months  Garner Nash, [facility] 06/26/2019 2:34 PM

## 2019-06-26 NOTE — Patient Instructions (Addendum)
Thank you for visiting Dr. Tonia Brooms at [facility]. Today we recommend the following:  Orders Placed This Encounter  Procedures  . Amb Referral to Palliative Care   Breztri samples today  Keep your symbicort and spiriva drugs and use, do not have to waste these.   Return in about 3 months (around 09/23/2019).    Please do your part to reduce the spread of COVID-19.

## 2019-06-26 NOTE — Addendum Note (Signed)
Addended by: Lanna Poche on: 06/26/2019 05:31 PM   Modules accepted: Orders

## 2019-06-26 NOTE — Addendum Note (Signed)
Addended by: Lanna Poche on: 06/26/2019 02:47 PM   Modules accepted: Orders

## 2019-06-28 ENCOUNTER — Telehealth: Payer: Self-pay | Admitting: Internal Medicine

## 2019-06-28 NOTE — Telephone Encounter (Signed)
Spoke with patient regarding Palliative services and all questions were answered and she was in agreement with this.  I have scheduled an In-Person Consult for 07/19/19 @ 1 PM (at patient's request).

## 2019-07-11 LAB — HEPATIC FUNCTION PANEL
ALT: 17 IU/L (ref 0–32)
AST: 19 IU/L (ref 0–40)
Albumin: 4.1 g/dL (ref 3.7–4.7)
Alkaline Phosphatase: 110 IU/L (ref 39–117)
Bilirubin Total: 0.4 mg/dL (ref 0.0–1.2)
Bilirubin, Direct: 0.12 mg/dL (ref 0.00–0.40)
Total Protein: 6.5 g/dL (ref 6.0–8.5)

## 2019-07-16 ENCOUNTER — Telehealth: Payer: Self-pay | Admitting: Internal Medicine

## 2019-07-16 DIAGNOSIS — E785 Hyperlipidemia, unspecified: Secondary | ICD-10-CM

## 2019-07-16 NOTE — Telephone Encounter (Signed)
Patient is calling for lab results.

## 2019-07-16 NOTE — Telephone Encounter (Signed)
Returned call to patient-aware of results and verbalized understanding.   She is wondering when to get her cholesterol rechecked.  Advised would verify with Dr. Rennis Golden and would mail her lab orders if needed prior to OV in 1 year.   Patient verbalized understanding

## 2019-07-17 NOTE — Telephone Encounter (Signed)
Hmm .. ok, liver enzymes normal - I guess just have her keep taking that and recheck lipid/liver in 3 months.  Thx

## 2019-07-17 NOTE — Telephone Encounter (Signed)
She is currently on crestor 20mg  daily per chart

## 2019-07-17 NOTE — Telephone Encounter (Signed)
Not sure I addressed this - her liver enzymes are normal. Could not tolerate lipitor. Can we try and get Livalo 4 mg daily approved? Repeat lipid and liver in 3 months.  Thanks.  Dr Rexene Edison

## 2019-07-18 NOTE — Telephone Encounter (Signed)
Labs ordered & mailed.   Attempted to call patient but phone rang with no answer

## 2019-07-19 ENCOUNTER — Other Ambulatory Visit: Payer: Self-pay

## 2019-07-19 ENCOUNTER — Other Ambulatory Visit: Payer: Medicare Other | Admitting: Internal Medicine

## 2019-08-20 ENCOUNTER — Telehealth: Payer: Self-pay | Admitting: Internal Medicine

## 2019-08-20 DIAGNOSIS — Z515 Encounter for palliative care: Secondary | ICD-10-CM

## 2019-08-20 NOTE — Telephone Encounter (Signed)
Returned phone call to patient from a voicemail message.  She requested to have today's appointment due to an episode of diarrhea and vomiting this week.  She states that a visitor in her home currently has the same symptoms and she did not want me to be exposed to same.  She denies any respiratory symptoms and is feeling better today.  Palliative care appointment was rescheduled to 09/13/2019 at 11:45am.  Patient agreed with plan.  She will call back if needed.  Margaretha Sheffield, NP-C

## 2019-09-04 ENCOUNTER — Ambulatory Visit
Admission: RE | Admit: 2019-09-04 | Discharge: 2019-09-04 | Disposition: A | Discharge status: Home / Self Care | Payer: Medicare Other | Source: Ambulatory Visit | Attending: Acute Care | Admitting: Acute Care

## 2019-09-04 DIAGNOSIS — Z122 Encounter for screening for malignant neoplasm of respiratory organs: Secondary | ICD-10-CM

## 2019-09-04 DIAGNOSIS — Z87891 Personal history of nicotine dependence: Secondary | ICD-10-CM

## 2019-09-05 NOTE — Progress Notes (Signed)
Please call patient and let them  know their  low dose Ct was read as a Lung RADS 1, negative study: no nodules or definitely benign nodules. Radiology recommendation is for a repeat LDCT in 12 months. .Please let them  know we will order and schedule their  annual screening scan for 08/2020. Please let them  know there was notation of CAD on their  scan.  Please remind the patient  that this is a non-gated exam therefore degree or severity of disease  cannot be determined. Please have them  follow up with their PCP regarding potential risk factor modification, dietary therapy or pharmacologic therapy if clinically indicated. Pt.  is  currently on statin therapy. Please place order for annual  screening scan for  08/2020 and fax results to PCP. Thanks so much.  Angelique Blonder, please let her know to continue the plan of care she and Dr. Tonia Brooms discussed in her last OV. She is supposed to follow up with him in May. Thanks

## 2019-09-09 LAB — HEPATIC FUNCTION PANEL
ALT: 26 IU/L (ref 0–32)
AST: 24 IU/L (ref 0–40)
Albumin: 4.3 g/dL (ref 3.7–4.7)
Alkaline Phosphatase: 97 IU/L (ref 39–117)
Bilirubin Total: 0.6 mg/dL (ref 0.0–1.2)
Bilirubin, Direct: 0.16 mg/dL (ref 0.00–0.40)
Total Protein: 6.5 g/dL (ref 6.0–8.5)

## 2019-09-09 LAB — LIPID PANEL
Chol/HDL Ratio: 2.4 ratio (ref 0.0–4.4)
Cholesterol, Total: 129 mg/dL (ref 100–199)
HDL: 54 mg/dL (ref >39)
LDL Chol Calc (NIH): 54 mg/dL (ref 0–99)
Triglycerides: 121 mg/dL (ref 0–149)
VLDL Cholesterol Cal: 21 mg/dL (ref 5–40)

## 2019-09-10 ENCOUNTER — Other Ambulatory Visit: Payer: Self-pay | Admitting: *Deleted

## 2019-09-10 DIAGNOSIS — Z87891 Personal history of nicotine dependence: Secondary | ICD-10-CM

## 2019-09-11 ENCOUNTER — Encounter: Payer: Self-pay | Admitting: Internal Medicine

## 2019-09-13 ENCOUNTER — Other Ambulatory Visit: Payer: Self-pay

## 2019-09-13 ENCOUNTER — Other Ambulatory Visit: Payer: Medicare Other | Admitting: Internal Medicine

## 2019-09-13 DIAGNOSIS — R0602 Shortness of breath: Secondary | ICD-10-CM

## 2019-09-13 DIAGNOSIS — Z515 Encounter for palliative care: Secondary | ICD-10-CM

## 2019-09-13 NOTE — Progress Notes (Signed)
    Therapist, nutritional Palliative Care Consult Note Telephone: [phone]  Fax: [phone]  PATIENT NAME: Vickie Bowman DOB: 1950/11/14 MRN: [id number]  PRIMARY CARE PROVIDER:   Elisabeth Most, FNP  REFERRING PROVIDER:  Elisabeth Most, FNP [address]  [place] [address]  RESPONSIBLE PARTY:   self      RECOMMENDATIONS and PLAN:  Palliative care encounter  Z51.5  1.Advance care planning:  DNAR and MOST forms previously recorded and in pt's home.  Pt's goal is to avoid hospitalization and participate in daily activities as desired.  Palliative care will continue to follow-up with patient in aprox. 1 month.  2.  Shortness of breath:  As related to COPD with hypoxia.  Increased respiratory stamina since beginning [place]. Encouraged COVID 19 vaccine.  Avoid respiratory irritants.  I spent 35 minutes providing this consultation,  from 1145 to 1220. More than 50% of the time in this consultation was spent coordinating communication with patient.   HISTORY OF PRESENT ILLNESS:     Follow-up with  Vickie Bowman.  She denies acute illness or falls since resolve of episode of vomiting and diarrhea.  She requires work/rest breaks due to shortness of breath even with use of supplemental oxygen. Palliative Care was asked to help address goals of care.   CODE STATUS:  DNAR/DNI  PPS: 50% HOSPICE ELIGIBILITY/DIAGNOSIS: TBD  PAST MEDICAL HISTORY:  Past Medical History:  Diagnosis Date  . Anxiety   . CHF (congestive heart failure) (H[facility])   . COPD (chronic obstructive pulmonary disease) (H[facility])   . Lumbar herniated disc   . Tobacco abuse      PERTINENT MEDICATIONS:  Outpatient Encounter Medications as of 09/13/2019  Medication Sig  . ALPRAZolam (XANAX) 0.25 MG tablet Take 1 tablet by mouth 2 (two) times daily.  Marland Kitchen aspirin EC 81 MG EC tablet Take 1 tablet (81 mg total) by mouth daily.  . Budeson-Glycopyrrol-Formoterol (BREZTRI AEROSPHERE) 160-9-4.8  MCG/ACT AERO Inhale 2 puffs into the lungs 2 (two) times daily.  . Budeson-Glycopyrrol-Formoterol (BREZTRI AEROSPHERE) 160-9-4.8 MCG/ACT AERO Inhale 2 puffs into the lungs 2 (two) times daily.  . budesonide-formoterol (SYMBICORT) 160-4.5 MCG/ACT inhaler Inhale 2 puffs into the lungs every 12 (twelve) hours.  . Cholecalciferol (VITAMIN D) 2000 units CAPS Take 1 capsule (2,000 Units total) by mouth daily.  Marland Kitchen HYDROcodone-acetaminophen (NORCO/VICODIN) 5-325 MG tablet Take 1 tablet by mouth 2 (two) times daily as needed for pain.  . metoprolol succinate (TOPROL-XL) 25 MG 24 hr tablet Take 0.5 tablets (12.5 mg total) by mouth daily.  Marland Kitchen PROAIR HFA 108 (90 Base) MCG/ACT inhaler Inhale 2 puffs into the lungs 4 (four) times daily as needed for wheezing.  . rosuvastatin (CRESTOR) 20 MG tablet Take 1 tablet (20 mg total) by mouth daily.  Marland Kitchen Spacer/Aero-Holding Chambers (AEROCHAMBER MV) inhaler Use as instructed  . SPIRIVA RESPIMAT 2.5 MCG/ACT AERS INHALE 2 SPRAY(S) BY MOUTH ONCE DAILY  . Tiotropium Bromide Monohydrate (SPIRIVA RESPIMAT) 2.5 MCG/ACT AERS Inhale 2 puffs into the lungs daily.   No facility-administered encounter medications on file as of 09/13/2019.    PHYSICAL EXAM:   General: NAD,well nourished elderly female in recliner Cardiovascular: regular rate and rhythm Pulmonary: clear throughout.  Minimally diminished RLL Abdomen: soft, nontender, + bowel sounds Extremities: no edema, steady gait Skin: exposed skin is intact Neurological:A&O x3  Margaretha Sheffield, NP-C  ,

## 2019-10-02 ENCOUNTER — Ambulatory Visit (INDEPENDENT_AMBULATORY_CARE_PROVIDER_SITE_OTHER): Payer: Medicare Other | Admitting: Pulmonary Disease

## 2019-10-02 ENCOUNTER — Other Ambulatory Visit: Payer: Self-pay

## 2019-10-02 ENCOUNTER — Encounter: Payer: Self-pay | Admitting: Pulmonary Disease

## 2019-10-02 VITALS — BP 118/68 | HR 63 | Temp 97.4°F | Ht 68.0 in | Wt 143.4 lb

## 2019-10-02 DIAGNOSIS — J9611 Chronic respiratory failure with hypoxia: Secondary | ICD-10-CM

## 2019-10-02 DIAGNOSIS — J449 Chronic obstructive pulmonary disease, unspecified: Secondary | ICD-10-CM

## 2019-10-02 DIAGNOSIS — Z87891 Personal history of nicotine dependence: Secondary | ICD-10-CM | POA: Diagnosis not present

## 2019-10-02 NOTE — Patient Instructions (Signed)
Thank you for visiting Dr. Tonia Brooms at [facility]. Today we recommend the following:  Continue current inhaler regimen.  Call us if needed.   Return in about 6 months (around 04/03/2020) for w/ Dr. Tonia Brooms .    Please do your part to reduce the spread of COVID-19.

## 2019-10-02 NOTE — Progress Notes (Signed)
Synopsis: Referred in November 2019 for COPD by Timoteo Gaul, FNP  Subjective:   PATIENT ID: Vickie Bowman GENDER: female DOB: [birth year], MRN: [id number]  Chief Complaint  Patient presents with  . Follow-up    PMH of CHF, tobacco abuse, quit smoking Aug 2019. Smoked since age 69 years.  Patient was admitted to the hospital in September 2019 as well as in October 2019 for COPD exacerbations.  The patient's most recent hospitalization for COPD she developed hypoxemic respiratory failure requiring nasal cannula supplementation.  Initial echocardiogram revealed a reduced ejection fraction and she was subsequently taken for a left heart cath in September 2019 which revealed nonobstructive CAD.  The patient was treated in the emergency room with CPAP, steroids, magnesium, albuterol.  Hospital course was relatively uncomplicated and the patient was discharged home on oxygen therapy.  Since her hospitalization she has been overall been doing well.  She does have significant dyspnea on exertion.  She feels like she has slowly been recovering.  She does acknowledge that she has been able to quit smoking.  She is currently on 3 L pulse from a POC.  She states that she has been trying to eat.  She does know that her BMI is low.  Patient denies chest pain, hemoptysis, daily sputum production.  OV 06/06/2018: She feels like she is a little congested. This has been getting worse throughout the past week. Cough is much worse. Not really bringing anything up at this time. She trys but it feels stuck.  Patient denies any fevers.  She has woke up in the middle the night with ongoing cough for the past several nights.  She does get up in the middle of the night and feel like her chest is more tight.  She has been using her albuterol more over the past couple of days.  Does not have any significant sputum production.  OV 11/29/2018: Here today for follow-up for management of severe COPD.  She has oxygen dependent  respiratory failure on oxygen concentrator today in the office at 3 L.  Patient currently managed well on Symbicort and Spiriva Respimat.  She likes her inhaler regimen.  She does notice that has made her breathe better.  Today in the office we discussed her goals of care.  She states that she remembers distinctly seeing her mother received cardiopulmonary resuscitation.  She would not ever want this to occur to her.  She understands that at some point she may get to the point where she is unable to breathe.  And would not want to be placed on mechanical life support.  We discussed all of this to include risk, benefits and alternatives in detail today in the office.  She is elected to proceed with filling out durable DNR forms today in the office.  As for her ongoing respiratory symptoms she wants to continue the use of her inhalers as long as possible.  We discussed the alternatives of using nebulized therapy as well.  She states that they also has trouble with her current POC machine.  She discussed this with her provider DME provider.  They need a new prescription to help with replacing the old one.  OV 06/26/2019: Patient seen today for follow-up regarding advanced COPD.  Last office visit she completed durable DNR forms in [place] form.  At this point she is doing well on Symbicort plus Spiriva.  She is very anxious about ever having to go back in the hospital she  would like to avoid this at all cost.  Otherwise from a respiratory standpoint she does have shortness of breath with exertion.  But she is still using her oxygen and trying to stay active as much as possible.  Her weight has been stable.  She denies night sweats fevers weight loss chills.  Patient denies hemoptysis  OV 10/02/2019: Patient is here today for COPD follow up.  Patient was started on breztri inhaler.  At this time doing well.  She still thinks that she is breathing much better than where she was before.  She can see a  significant difference after changing inhaler regimen.  She feels like she can breathe better in the evening time in comparison to her other meds.  She is able to take a bath without feeling as dyspneic as she was in the past.  Denies hemoptysis.  Is denies any significant cough or sputum production.    Past Medical History:  Diagnosis Date  . Anxiety   . CHF (congestive heart failure) ([place])   . COPD (chronic obstructive pulmonary disease) ([place])   . Lumbar herniated disc   . Tobacco abuse      Family History  Problem Relation Age of Onset  . Liver cancer Mother   . Breast cancer Sister   . CAD Neg Hx      Past Surgical History:  Procedure Laterality Date  . LEFT HEART CATH AND CORONARY ANGIOGRAPHY N/A 02/05/2018   Procedure: LEFT HEART CATH AND CORONARY ANGIOGRAPHY;  Surgeon: Nelva Bush, MD;  Location: [facility];  Service: Cardiovascular;  Laterality: N/A;    Social History   Socioeconomic History  . Marital status: Widowed    Spouse name: Not on file  . Number of children: Not on file  . Years of education: Not on file  . Highest education level: Not on file  Occupational History  . Not on file  Tobacco Use  . Smoking status: Former Smoker    Packs/day: 1.50    Years: 53.00    Pack years: 79.50    Start date: 05/24/1967    Quit date: 01/07/2018    Years since quitting: 1.7  . Smokeless tobacco: Never Used  . Tobacco comment: Smoked since age 27, quit 12/2017  Substance and Sexual Activity  . Alcohol use: Yes    Comment: Rare, glass of wine no more than every few weeks  . Drug use: Never  . Sexual activity: Not Currently  Other Topics Concern  . Not on file  Social History Narrative  . Not on file   Social Determinants of Health   Financial Resource Strain:   . Difficulty of Paying Living Expenses:   Food Insecurity:   . Worried About Charity fundraiser in the Last Year:   . Arboriculturist in the Last Year:   Transportation Needs:   . Consulting civil engineer (Medical):   Marland Kitchen Lack of Transportation (Non-Medical):   Physical Activity:   . Days of Exercise per Week:   . Minutes of Exercise per Session:   Stress:   . Feeling of Stress :   Social Connections:   . Frequency of Communication with Friends and Family:   . Frequency of Social Gatherings with Friends and Family:   . Attends Religious Services:   . Active Member of Clubs or Organizations:   . Attends Archivist Meetings:   Marland Kitchen Marital Status:   Intimate Partner Violence:   . Fear  of Current or Ex-Partner:   . Emotionally Abused:   Marland Kitchen Physically Abused:   . Sexually Abused:      Allergies  Allergen Reactions  . Penicillins Swelling    Has patient had a PCN reaction causing immediate rash, facial/tongue/throat swelling, SOB or lightheadedness with hypotension: Yes Has patient had a PCN reaction causing severe rash involving mucus membranes or skin necrosis: No Has patient had a PCN reaction that required hospitalization: No Has patient had a PCN reaction occurring within the last 10 years: No If all of the above answers are "NO", then may proceed with Cephalosporin use.      Outpatient Medications Prior to Visit  Medication Sig Dispense Refill  . ALPRAZolam (XANAX) 0.25 MG tablet Take 1 tablet by mouth 2 (two) times daily.  0  . aspirin EC 81 MG EC tablet Take 1 tablet (81 mg total) by mouth daily. 30 tablet 0  . Budeson-Glycopyrrol-Formoterol (BREZTRI AEROSPHERE) 160-9-4.8 MCG/ACT AERO Inhale 2 puffs into the lungs 2 (two) times daily. 11.8 g 0  . Budeson-Glycopyrrol-Formoterol (BREZTRI AEROSPHERE) 160-9-4.8 MCG/ACT AERO Inhale 2 puffs into the lungs 2 (two) times daily. 10.7 g 12  . budesonide-formoterol (SYMBICORT) 160-4.5 MCG/ACT inhaler Inhale 2 puffs into the lungs every 12 (twelve) hours. 1 Inhaler 0  . Cholecalciferol (VITAMIN D) 2000 units CAPS Take 1 capsule (2,000 Units total) by mouth daily. 30 capsule 5  . HYDROcodone-acetaminophen  (NORCO/VICODIN) 5-325 MG tablet Take 1 tablet by mouth 2 (two) times daily as needed for pain.  0  . metoprolol succinate (TOPROL-XL) 25 MG 24 hr tablet Take 0.5 tablets (12.5 mg total) by mouth daily. 60 tablet 0  . PROAIR HFA 108 (90 Base) MCG/ACT inhaler Inhale 2 puffs into the lungs 4 (four) times daily as needed for wheezing. 1 Inhaler 5  . Spacer/Aero-Holding Chambers (AEROCHAMBER MV) inhaler Use as instructed 1 each 0  . SPIRIVA RESPIMAT 2.5 MCG/ACT AERS INHALE 2 SPRAY(S) BY MOUTH ONCE DAILY 4 g 3  . Tiotropium Bromide Monohydrate (SPIRIVA RESPIMAT) 2.5 MCG/ACT AERS Inhale 2 puffs into the lungs daily. 12 g 1  . rosuvastatin (CRESTOR) 20 MG tablet Take 1 tablet (20 mg total) by mouth daily. 90 tablet 3   No facility-administered medications prior to visit.    Review of Systems  Constitutional: Negative for chills, fever, malaise/fatigue and weight loss.  HENT: Negative for hearing loss, sore throat and tinnitus.   Eyes: Negative for blurred vision and double vision.  Respiratory: Positive for shortness of breath. Negative for cough, hemoptysis, sputum production, wheezing and stridor.   Cardiovascular: Negative for chest pain, palpitations, orthopnea, leg swelling and PND.  Gastrointestinal: Negative for abdominal pain, constipation, diarrhea, heartburn, nausea and vomiting.  Genitourinary: Negative for dysuria, hematuria and urgency.  Musculoskeletal: Negative for joint pain and myalgias.  Skin: Negative for itching and rash.  Neurological: Negative for dizziness, tingling, weakness and headaches.  Endo/Heme/Allergies: Negative for environmental allergies. Does not bruise/bleed easily.  Psychiatric/Behavioral: Negative for depression. The patient is not nervous/anxious and does not have insomnia.   All other systems reviewed and are negative.    Objective:  Physical Exam Vitals reviewed.  Constitutional:      General: She is not in acute distress.    Appearance: She is  well-developed.  HENT:     Head: Normocephalic and atraumatic.     Mouth/Throat:     Pharynx: No oropharyngeal exudate.  Eyes:     Conjunctiva/sclera: Conjunctivae normal.     Pupils: Pupils  are equal, round, and reactive to light.  Neck:     Vascular: No JVD.     Trachea: No tracheal deviation.     Comments: Loss of supraclavicular fat Cardiovascular:     Rate and Rhythm: Normal rate and regular rhythm.     Heart sounds: S1 normal and S2 normal.     Comments: Distant heart tones Pulmonary:     Effort: No tachypnea or accessory muscle usage.     Breath sounds: No stridor. Decreased breath sounds (throughout all lung fields) present. No wheezing, rhonchi or rales.  Abdominal:     General: Bowel sounds are normal. There is no distension.     Palpations: Abdomen is soft.     Tenderness: There is no abdominal tenderness.  Musculoskeletal:        General: Deformity (muscle wasting ) present.  Skin:    General: Skin is warm and dry.     Capillary Refill: Capillary refill takes less than 2 seconds.     Findings: No rash.  Neurological:     Mental Status: She is alert and oriented to person, place, and time.  Psychiatric:        Behavior: Behavior normal.      Vitals:   10/02/19 1411  BP: 118/68  Pulse: 63  Temp: (!) 97.4 F (36.3 C)  TempSrc: Temporal  SpO2: 94%  Weight: 143 lb 6.4 oz (65 kg)  Height: _0  (1.727 m)   94% on 3 L pulse POC BMI Readings from Last 3 Encounters:  10/02/19 21.80 kg/m  06/26/19 23.42 kg/m  06/17/19 22.99 kg/m   Wt Readings from Last 3 Encounters:  10/02/19 143 lb 6.4 oz (65 kg)  06/26/19 154 lb (69.9 kg)  06/17/19 151 lb 3.2 oz (68.6 kg)     CBC    Component Value Date/Time   WBC 5.7 03/05/2018 0327   RBC 4.61 03/05/2018 0327   HGB 14.0 03/05/2018 0327   HCT 44.4 03/05/2018 0327   PLT 228 03/05/2018 0327   MCV 96.3 03/05/2018 0327   MCH 30.4 03/05/2018 0327   MCHC 31.5 03/05/2018 0327   RDW 12.9 03/05/2018 0327    LYMPHSABS 2.3 03/04/2018 2146   MONOABS 0.7 03/04/2018 2146   EOSABS 0.2 03/04/2018 2146   BASOSABS 0.1 03/04/2018 2146    Chest Imaging: 03/04/2018 chest x-ray: Emphysematous lung changes The patient's images have been independently reviewed by me.    Low-dose lung cancer screening CT completed in March 2020: Lung RADS 1, considered negative recommending annual 63-monthfollow-up.  This is already been ordered. Additionally there was some findings of bronchiectasis with no lower lobe evidence of mucoid impaction possible underlying atypical infection such as MAC.  The patient's images have been independently reviewed by me.    09/04/2019: Lung cancer screening CT Lung RADS 1 - recommending 1 year follow-up evidence of emphysema and coronary artery disease. The patient's images have been independently reviewed by me.    Pulmonary Functions Testing Results: PFT Results Latest Ref Rng & Units 03/30/2018  FVC-Pre L 2.11  FVC-Predicted Pre % 100  FVC-Post L 2.31  FVC-Predicted Post % 109  Pre FEV1/FVC % % 41  Post FEV1/FCV % % 48  FEV1-Pre L 0.87  FEV1-Predicted Pre % 55  FEV1-Post L 1.10  DLCO UNC% % 78  DLCO COR %Predicted % 59  TLC L 8.24  TLC % Predicted % 213  RV % Predicted % 341   6MWT: SIX MIN WALK 06/06/2018  Medications Proair at 3pm, Aspirin 56m at 7:30, Vitamin D 2000 units, metoprolol 12.589m Dulera 100, Spiriva 1817mat 10am  Supplimental Oxygen during Test? (L/min) Yes  O2 Flow Rate 3  Type Continuous  Laps 9  Partial Lap (in Meters) 0  Baseline BP (sitting) 114/70  Baseline Heartrate 91  Baseline Dyspnea (Borg Scale) 3  Baseline Fatigue (Borg Scale) 3  Baseline SPO2 98  BP (sitting) 130/80  Heartrate 115  Dyspnea (Borg Scale) 9  Fatigue (Borg Scale) 4  SPO2 95  BP (sitting) 122/70  Heartrate 101  SPO2 98  Stopped or Paused before Six Minutes No  Distance Completed 306  Tech Comments: Pt walked at a normal pace without stopping during the walk. Pt  denied any complaints.    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 2. Normal left ventricular filling pressure. 3. Low normal left ventricular filling pressure with subtle apical hypokinesis (LVEF 50-55%).    Assessment & Plan:     ICD-10-CM   1. Stage 2 moderate COPD by GOLD classification (HCCCross PlainsJ44.9   2. Former smoker  Z87.891   3. Chronic hypoxemic respiratory failure (HCC)  J96.11     Discussion:  This is a 69 48ar old female longstanding history of smoking since age 19 45it in 2019 October after a severe exacerbation and hospitalization.  She has chronic hypoxemic respiratory failure on 3 L nasal cannula pulse therapy.  Low BMI.  Treated for severe COPD.  Plan: Recommend continuing her current inhaler regimen. Continue albuterol as needed plus triple therapy Continue oxygen therapy with 3 L pulse, POC. Reviewed lung cancer screening CT from April as above with patient, repeat planned for April 2022. Return to clinic in 6 months or as needed.  BraGarner NashO [facility] 10/02/2019 2:34 PM

## 2019-10-10 ENCOUNTER — Other Ambulatory Visit: Payer: Medicare Other | Admitting: Internal Medicine

## 2019-10-15 NOTE — Progress Notes (Signed)
Therapist, nutritional Palliative Care Consult Note Telephone: [phone]  Fax: [phone]  PATIENT NAME: Vickie Bowman DOB: 04-12-1951 MRN: [id number]  PRIMARY CARE PROVIDER:   Elisabeth Most, FNP  REFERRING PROVIDER:  Elisabeth Most, FNP [address],  [place] [address]  RESPONSIBLE PARTY:   self      RECOMMENDATIONS and PLAN:  Palliative care encounter  Z51.5  1.Advance care planning:  DNAR and MOST forms previously recorded and in pt's home.  Pt's goal is to avoid hospitalization and participate in daily activities as desired.  Palliative care will continue to follow-up with patient as needed.  Pt is aware that she can call this NP is her current health status changes.  2.  Shortness of breath:  As related to COPD with hypoxia. Stable and at baseline with use of [place].  Avoid respiratory irritants.  Continue use of supplemental O2.  I spent 30 minutes providing this consultation,  from 1130 to 12. More than 50% of the time in this consultation was spent coordinating communication with patient.   HISTORY OF PRESENT ILLNESS:     Follow-up with  Sherrilee Gilles.  She denies acute illness or injuries.  She requires work/rest breaks due to shortness of breath even with use of supplemental oxygen. She has been avoiding environmental irritants.  Palliative Care was asked to help address goals of care.   CODE STATUS:  DNAR/DNI  PPS: 50% HOSPICE ELIGIBILITY/DIAGNOSIS: TBD  PAST MEDICAL HISTORY:      Past Medical History:  Diagnosis Date  . Anxiety   . CHF (congestive heart failure) (HCC)   . COPD (chronic obstructive pulmonary disease) (HCC)   . Lumbar herniated disc   . Tobacco abuse      PERTINENT MEDICATIONS:      Outpatient Encounter Medications as of 09/13/2019  Medication Sig  . ALPRAZolam (XANAX) 0.25 MG tablet Take 1 tablet by mouth 2 (two) times daily.  Marland Kitchen aspirin EC 81 MG EC tablet Take 1 tablet (81 mg  total) by mouth daily.  . Budeson-Glycopyrrol-Formoterol (BREZTRI AEROSPHERE) 160-9-4.8 MCG/ACT AERO Inhale 2 puffs into the lungs 2 (two) times daily.  . Budeson-Glycopyrrol-Formoterol (BREZTRI AEROSPHERE) 160-9-4.8 MCG/ACT AERO Inhale 2 puffs into the lungs 2 (two) times daily.  . budesonide-formoterol (SYMBICORT) 160-4.5 MCG/ACT inhaler Inhale 2 puffs into the lungs every 12 (twelve) hours.  . Cholecalciferol (VITAMIN D) 2000 units CAPS Take 1 capsule (2,000 Units total) by mouth daily.  Marland Kitchen HYDROcodone-acetaminophen (NORCO/VICODIN) 5-325 MG tablet Take 1 tablet by mouth 2 (two) times daily as needed for pain.  . metoprolol succinate (TOPROL-XL) 25 MG 24 hr tablet Take 0.5 tablets (12.5 mg total) by mouth daily.  Marland Kitchen PROAIR HFA 108 (90 Base) MCG/ACT inhaler Inhale 2 puffs into the lungs 4 (four) times daily as needed for wheezing.  . rosuvastatin (CRESTOR) 20 MG tablet Take 1 tablet (20 mg total) by mouth daily.  Marland Kitchen Spacer/Aero-Holding Chambers (AEROCHAMBER MV) inhaler Use as instructed  . SPIRIVA RESPIMAT 2.5 MCG/ACT AERS INHALE 2 SPRAY(S) BY MOUTH ONCE DAILY  . Tiotropium Bromide Monohydrate (SPIRIVA RESPIMAT) 2.5 MCG/ACT AERS Inhale 2 puffs into the lungs daily.   No facility-administered encounter medications on file as of 09/13/2019.    PHYSICAL EXAM:   General: NAD,well nourished elderly female in recliner Cardiovascular: regular rate and rhythm Pulmonary: clear throughout.  O2 via [place] in use.  O2 sats 96% Abdomen: soft, nontender, + bowel sounds Extremities: no edema, steady gait  Skin: exposed skin is intact Neurological:A&O x3 Psych:  In good spirits  Gonzella Lex, NP-C

## 2019-11-19 ENCOUNTER — Telehealth: Payer: Self-pay | Admitting: Pulmonary Disease

## 2019-11-19 NOTE — Telephone Encounter (Signed)
Dr. Tonia Brooms, please see phone note and advise.  Thank you.

## 2019-11-21 NOTE — Telephone Encounter (Signed)
Spoke with the pt and notified of recs per Dr Tonia Brooms and she verbalized understanding

## 2019-11-21 NOTE — Telephone Encounter (Signed)
Which ever vaccine is available to her would be fine.  Thanks BLI

## 2020-01-24 ENCOUNTER — Telehealth: Payer: Self-pay | Admitting: Internal Medicine

## 2020-01-24 NOTE — Telephone Encounter (Signed)
Reports her hand "feels like it is asleep, tingling" for the past 4-5 days. Denies CP, sob, edema/weight gain, radiating pain. Advised to call PCP to discuss/evaluate. Patient verbalized understanding and agreeable to plan.

## 2020-01-24 NOTE — Telephone Encounter (Signed)
    Pt is calling said he left hand been feeling numb for the last 4-5 days. She wasn't sure if its related to her heart, she said she might need an EKG. She would like to speak with a nurse to discuss

## 2020-03-26 ENCOUNTER — Encounter: Payer: Self-pay | Admitting: Pulmonary Disease

## 2020-03-26 ENCOUNTER — Ambulatory Visit (INDEPENDENT_AMBULATORY_CARE_PROVIDER_SITE_OTHER): Payer: Medicare Other | Admitting: Pulmonary Disease

## 2020-03-26 ENCOUNTER — Other Ambulatory Visit: Payer: Self-pay

## 2020-03-26 VITALS — BP 114/80 | HR 77 | Temp 97.6°F | Wt 144.5 lb

## 2020-03-26 DIAGNOSIS — J9611 Chronic respiratory failure with hypoxia: Secondary | ICD-10-CM | POA: Diagnosis not present

## 2020-03-26 DIAGNOSIS — J479 Bronchiectasis, uncomplicated: Secondary | ICD-10-CM

## 2020-03-26 DIAGNOSIS — J449 Chronic obstructive pulmonary disease, unspecified: Secondary | ICD-10-CM | POA: Diagnosis not present

## 2020-03-26 DIAGNOSIS — Z87891 Personal history of nicotine dependence: Secondary | ICD-10-CM

## 2020-03-26 DIAGNOSIS — F17201 Nicotine dependence, unspecified, in remission: Secondary | ICD-10-CM

## 2020-03-26 MED ORDER — TRELEGY ELLIPTA 100-62.5-25 MCG/INH IN AEPB: 1.0000 | INHALATION_SPRAY | Freq: Once | RESPIRATORY_TRACT | 6 refills | Status: AC

## 2020-03-26 NOTE — Patient Instructions (Signed)
Thank you for visiting Dr. Tonia Brooms at [facility]. Today we recommend the following:  Your LDCT Lung Cancer Screening will be complete in April 2022  Meds ordered this encounter  Medications  . Fluticasone-Umeclidin-Vilant (TRELEGY ELLIPTA) 100-62.5-25 MCG/INH AEPB    Sig: Inhale 1 puff into the lungs once for 1 dose.    Dispense:  1 each    Refill:  6   If you do not like the trelegy please switch back to breztri   Return in about 6 months (around 09/23/2020).    Please do your part to reduce the spread of COVID-19.

## 2020-03-26 NOTE — Progress Notes (Signed)
Synopsis: Referred in November 2019 for COPD by Timoteo Gaul, FNP  Subjective:   PATIENT ID: Vickie Bowman GENDER: female DOB: [birth year], MRN: [id number]  Chief Complaint  Patient presents with  . Follow-up    wanted to see if the bevestri can be changed to something stronger. says that she feels nervous on the inside    PMH of CHF, tobacco abuse, quit smoking Aug 2019. Smoked since age 69 years.  Patient was admitted to the hospital in September 2019 as well as in October 2019 for COPD exacerbations.  The patient's most recent hospitalization for COPD she developed hypoxemic respiratory failure requiring nasal cannula supplementation.  Initial echocardiogram revealed a reduced ejection fraction and she was subsequently taken for a left heart cath in September 2019 which revealed nonobstructive CAD.  The patient was treated in the emergency room with CPAP, steroids, magnesium, albuterol.  Hospital course was relatively uncomplicated and the patient was discharged home on oxygen therapy.  Since her hospitalization she has been overall been doing well.  She does have significant dyspnea on exertion.  She feels like she has slowly been recovering.  She does acknowledge that she has been able to quit smoking.  She is currently on 3 L pulse from a POC.  She states that she has been trying to eat.  She does know that her BMI is low.  Patient denies chest pain, hemoptysis, daily sputum production.  OV 06/06/2018: She feels like she is a little congested. This has been getting worse throughout the past week. Cough is much worse. Not really bringing anything up at this time. She trys but it feels stuck.  Patient denies any fevers.  She has woke up in the middle the night with ongoing cough for the past several nights.  She does get up in the middle of the night and feel like her chest is more tight.  She has been using her albuterol more over the past couple of days.  Does not have any significant  sputum production.  OV 11/29/2018: Here today for follow-up for management of severe COPD.  She has oxygen dependent respiratory failure on oxygen concentrator today in the office at 3 L.  Patient currently managed well on Symbicort and Spiriva Respimat.  She likes her inhaler regimen.  She does notice that has made her breathe better.  Today in the office we discussed her goals of care.  She states that she remembers distinctly seeing her mother received cardiopulmonary resuscitation.  She would not ever want this to occur to her.  She understands that at some point she may get to the point where she is unable to breathe.  And would not want to be placed on mechanical life support.  We discussed all of this to include risk, benefits and alternatives in detail today in the office.  She is elected to proceed with filling out durable DNR forms today in the office.  As for her ongoing respiratory symptoms she wants to continue the use of her inhalers as long as possible.  We discussed the alternatives of using nebulized therapy as well.  She states that they also has trouble with her current POC machine.  She discussed this with her provider DME provider.  They need a new prescription to help with replacing the old one.  OV 06/26/2019: Patient seen today for follow-up regarding advanced COPD.  Last office visit she completed durable DNR forms in [place] form.  At this point  she is doing well on Symbicort plus Spiriva.  She is very anxious about ever having to go back in the hospital she would like to avoid this at all cost.  Otherwise from a respiratory standpoint she does have shortness of breath with exertion.  But she is still using her oxygen and trying to stay active as much as possible.  Her weight has been stable.  She denies night sweats fevers weight loss chills.  Patient denies hemoptysis  OV 10/02/2019: Patient is here today for COPD follow up.  Patient was started on breztri inhaler.  At this  time doing well.  She still thinks that she is breathing much better than where she was before.  She can see a significant difference after changing inhaler regimen.  She feels like she can breathe better in the evening time in comparison to her other meds.  She is able to take a bath without feeling as dyspneic as she was in the past.  Denies hemoptysis.  Is denies any significant cough or sputum production.  OV 03/26/2020: Patient here for COPD follow-up.  She was on breztri inhaler as treated previously.  She does feel like she has shortness of breath with exertion.  She does feel stable at this time with her current regimen but she is interested in potentially switching to Trelegy inhaler.  She has had some friends that have used and have done really well with it.  She would like to give it a shot to see if makes any difference in her respiratory symptoms.  Currently stable on 2 L pulse with a POC today in the office.  She has a planned noncontrasted CT chest lung cancer screening for April 2021.    Past Medical History:  Diagnosis Date  . Anxiety   . CHF (congestive heart failure) ([place])   . COPD (chronic obstructive pulmonary disease) ([place])   . Lumbar herniated disc   . Tobacco abuse      Family History  Problem Relation Age of Onset  . Liver cancer Mother   . Breast cancer Sister   . CAD Neg Hx      Past Surgical History:  Procedure Laterality Date  . LEFT HEART CATH AND CORONARY ANGIOGRAPHY N/A 02/05/2018   Procedure: LEFT HEART CATH AND CORONARY ANGIOGRAPHY;  Surgeon: Nelva Bush, MD;  Location: [facility];  Service: Cardiovascular;  Laterality: N/A;    Social History   Socioeconomic History  . Marital status: Widowed    Spouse name: Not on file  . Number of children: Not on file  . Years of education: Not on file  . Highest education level: Not on file  Occupational History  . Not on file  Tobacco Use  . Smoking status: Former Smoker    Packs/day: 1.50     Years: 53.00    Pack years: 79.50    Start date: 05/24/1967    Quit date: 01/07/2018    Years since quitting: 2.2  . Smokeless tobacco: Never Used  . Tobacco comment: Smoked since age 8, quit 12/2017  Vaping Use  . Vaping Use: Never used  Substance and Sexual Activity  . Alcohol use: Yes    Comment: Rare, glass of wine no more than every few weeks  . Drug use: Never  . Sexual activity: Not Currently  Other Topics Concern  . Not on file  Social History Narrative  . Not on file   Social Determinants of Health   Financial Resource Strain:   .  Difficulty of Paying Living Expenses: Not on file  Food Insecurity:   . Worried About Charity fundraiser in the Last Year: Not on file  . Ran Out of Food in the Last Year: Not on file  Transportation Needs:   . Lack of Transportation (Medical): Not on file  . Lack of Transportation (Non-Medical): Not on file  Physical Activity:   . Days of Exercise per Week: Not on file  . Minutes of Exercise per Session: Not on file  Stress:   . Feeling of Stress : Not on file  Social Connections:   . Frequency of Communication with Friends and Family: Not on file  . Frequency of Social Gatherings with Friends and Family: Not on file  . Attends Religious Services: Not on file  . Active Member of Clubs or Organizations: Not on file  . Attends Archivist Meetings: Not on file  . Marital Status: Not on file  Intimate Partner Violence:   . Fear of Current or Ex-Partner: Not on file  . Emotionally Abused: Not on file  . Physically Abused: Not on file  . Sexually Abused: Not on file     Allergies  Allergen Reactions  . Penicillins Swelling    Has patient had a PCN reaction causing immediate rash, facial/tongue/throat swelling, SOB or lightheadedness with hypotension: Yes Has patient had a PCN reaction causing severe rash involving mucus membranes or skin necrosis: No Has patient had a PCN reaction that required hospitalization: No Has  patient had a PCN reaction occurring within the last 10 years: No If all of the above answers are "NO", then may proceed with Cephalosporin use.      Outpatient Medications Prior to Visit  Medication Sig Dispense Refill  . ALPRAZolam (XANAX) 0.25 MG tablet Take 1 tablet by mouth 2 (two) times daily.  0  . ALPRAZolam (XANAX) 1 MG tablet Take 1 mg by mouth 3 (three) times daily as needed.    Marland Kitchen aspirin EC 81 MG EC tablet Take 1 tablet (81 mg total) by mouth daily. 30 tablet 0  . Budeson-Glycopyrrol-Formoterol (BREZTRI AEROSPHERE) 160-9-4.8 MCG/ACT AERO Inhale 2 puffs into the lungs 2 (two) times daily. 11.8 g 0  . Budeson-Glycopyrrol-Formoterol (BREZTRI AEROSPHERE) 160-9-4.8 MCG/ACT AERO Inhale 2 puffs into the lungs 2 (two) times daily. 10.7 g 12  . Cholecalciferol (VITAMIN D) 2000 units CAPS Take 1 capsule (2,000 Units total) by mouth daily. 30 capsule 5  . HYDROcodone-acetaminophen (NORCO/VICODIN) 5-325 MG tablet Take 1 tablet by mouth 2 (two) times daily as needed for pain.  0  . meloxicam (MOBIC) 7.5 MG tablet Take 7.5 mg by mouth daily.    . metoprolol succinate (TOPROL-XL) 25 MG 24 hr tablet Take 0.5 tablets (12.5 mg total) by mouth daily. 60 tablet 0  . PROAIR HFA 108 (90 Base) MCG/ACT inhaler Inhale 2 puffs into the lungs 4 (four) times daily as needed for wheezing. 1 Inhaler 5  . Spacer/Aero-Holding Chambers (AEROCHAMBER MV) inhaler Use as instructed 1 each 0  . SPIRIVA RESPIMAT 2.5 MCG/ACT AERS INHALE 2 SPRAY(S) BY MOUTH ONCE DAILY 4 g 3  . Tiotropium Bromide Monohydrate (SPIRIVA RESPIMAT) 2.5 MCG/ACT AERS Inhale 2 puffs into the lungs daily. 12 g 1  . budesonide-formoterol (SYMBICORT) 160-4.5 MCG/ACT inhaler Inhale 2 puffs into the lungs every 12 (twelve) hours. (Patient not taking: Reported on 03/26/2020) 1 Inhaler 0  . rosuvastatin (CRESTOR) 20 MG tablet Take 1 tablet (20 mg total) by mouth daily. 90 tablet 3  No facility-administered medications prior to visit.    Review of  Systems  Constitutional: Negative for chills, fever, malaise/fatigue and weight loss.  HENT: Negative for hearing loss, sore throat and tinnitus.   Eyes: Negative for blurred vision and double vision.  Respiratory: Positive for shortness of breath. Negative for cough, hemoptysis, sputum production, wheezing and stridor.   Cardiovascular: Negative for chest pain, palpitations, orthopnea, leg swelling and PND.  Gastrointestinal: Negative for abdominal pain, constipation, diarrhea, heartburn, nausea and vomiting.  Genitourinary: Negative for dysuria, hematuria and urgency.  Musculoskeletal: Negative for joint pain and myalgias.  Skin: Negative for itching and rash.  Neurological: Negative for dizziness, tingling, weakness and headaches.  Endo/Heme/Allergies: Negative for environmental allergies. Does not bruise/bleed easily.  Psychiatric/Behavioral: Negative for depression. The patient is not nervous/anxious and does not have insomnia.   All other systems reviewed and are negative.    Objective:  Physical Exam Vitals reviewed.  Constitutional:      General: She is not in acute distress.    Appearance: She is well-developed.  HENT:     Head: Normocephalic and atraumatic.     Mouth/Throat:     Pharynx: No oropharyngeal exudate.  Eyes:     Conjunctiva/sclera: Conjunctivae normal.     Pupils: Pupils are equal, round, and reactive to light.  Neck:     Vascular: No JVD.     Trachea: No tracheal deviation.     Comments: Loss of supraclavicular fat Cardiovascular:     Rate and Rhythm: Normal rate and regular rhythm.     Heart sounds: S1 normal and S2 normal.     Comments: Distant heart tones Pulmonary:     Effort: No tachypnea or accessory muscle usage.     Breath sounds: No stridor. Decreased breath sounds (throughout all lung fields) present. No wheezing, rhonchi or rales.  Abdominal:     General: Bowel sounds are normal. There is no distension.     Palpations: Abdomen is soft.      Tenderness: There is no abdominal tenderness.  Musculoskeletal:        General: Deformity (muscle wasting ) present.  Skin:    General: Skin is warm and dry.     Capillary Refill: Capillary refill takes less than 2 seconds.     Findings: No rash.  Neurological:     Mental Status: She is alert and oriented to person, place, and time.  Psychiatric:        Behavior: Behavior normal.      Vitals:   03/26/20 1636  BP: 114/80  Pulse: 77  Temp: 97.6 F (36.4 C)  TempSrc: Oral  SpO2: 98%  Weight: 144 lb 8 oz (65.5 kg)   98% on 3 L pulse POC BMI Readings from Last 3 Encounters:  03/26/20 21.97 kg/m  10/02/19 21.80 kg/m  06/26/19 23.42 kg/m   Wt Readings from Last 3 Encounters:  03/26/20 144 lb 8 oz (65.5 kg)  10/02/19 143 lb 6.4 oz (65 kg)  06/26/19 154 lb (69.9 kg)     CBC    Component Value Date/Time   WBC 5.7 03/05/2018 0327   RBC 4.61 03/05/2018 0327   HGB 14.0 03/05/2018 0327   HCT 44.4 03/05/2018 0327   PLT 228 03/05/2018 0327   MCV 96.3 03/05/2018 0327   MCH 30.4 03/05/2018 0327   MCHC 31.5 03/05/2018 0327   RDW 12.9 03/05/2018 0327   LYMPHSABS 2.3 03/04/2018 2146   MONOABS 0.7 03/04/2018 2146   EOSABS 0.2 03/04/2018  2146   BASOSABS 0.1 03/04/2018 2146    Chest Imaging: 03/04/2018 chest x-ray: Emphysematous lung changes The patient's images have been independently reviewed by me.    Low-dose lung cancer screening CT completed in March 2020: Lung RADS 1, considered negative recommending annual 57-monthfollow-up.  This is already been ordered. Additionally there was some findings of bronchiectasis with no lower lobe evidence of mucoid impaction possible underlying atypical infection such as MAC.  The patient's images have been independently reviewed by me.    09/04/2019: Lung cancer screening CT Lung RADS 1 - recommending 1 year follow-up evidence of emphysema and coronary artery disease. The patient's images have been independently reviewed by me.     Pulmonary Functions Testing Results: PFT Results Latest Ref Rng & Units 03/30/2018  FVC-Pre L 2.11  FVC-Predicted Pre % 100  FVC-Post L 2.31  FVC-Predicted Post % 109  Pre FEV1/FVC % % 41  Post FEV1/FCV % % 48  FEV1-Pre L 0.87  FEV1-Predicted Pre % 55  FEV1-Post L 1.10  DLCO uncorrected ml/min/mmHg 10.54  DLCO UNC% % 78  DLVA Predicted % 59  TLC L 8.24  TLC % Predicted % 213  RV % Predicted % 341   6MWT: SIX MIN WALK 06/06/2018  Medications Proair at 3pm, Aspirin 8232mat 7:30, Vitamin D 2000 units, metoprolol 12.32m91mDulera 100, Spiriva 48m41mt 10am  Supplimental Oxygen during Test? (L/min) Yes  O2 Flow Rate 3  Type Continuous  Laps 9  Partial Lap (in Meters) 0  Baseline BP (sitting) 114/70  Baseline Heartrate 91  Baseline Dyspnea (Borg Scale) 3  Baseline Fatigue (Borg Scale) 3  Baseline SPO2 98  BP (sitting) 130/80  Heartrate 115  Dyspnea (Borg Scale) 9  Fatigue (Borg Scale) 4  SPO2 95  BP (sitting) 122/70  Heartrate 101  SPO2 98  Stopped or Paused before Six Minutes No  Distance Completed 306  Tech Comments: Pt walked at a normal pace without stopping during the walk. Pt denied any complaints.    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 2. Normal left ventricular filling pressure. 3. Low normal left ventricular filling pressure with subtle apical hypokinesis (LVEF 50-55%).    Assessment & Plan:     ICD-10-CM   1. Stage 2 moderate COPD by GOLD classification (HCC)[place]44.9   2. Former smoker  Z87.891   3. Chronic hypoxemic respiratory failure (HCC)  J96.11   4. Moderate COPD (chronic obstructive pulmonary disease) (HCC)[place]44.9   5. Bronchiectasis without complication (HCC)[place]47.H06.2. 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  She is a former smoker.  She is enrolled in our lung cancer screening program.  She has a repeat planned for April 2022.  Plan: Continue her current as needed albuterol for shortness of breath and wheezing. Continue orders for oxygen therapy with 3 L pulse POC. She has oxygen concentrator at home to continue to use. Attempt to maintain O2 sats above 88%. She can continue to monitor these at home with her pulse oximeter. Samples today of Trelegy inhaler. She can stop her Breztri inhaler while she is giving trial of the Trelegy. Encouraged her to maintain her  weight.  She has been doing well with this.  Low-dose lung cancer screening CT to be completed in April 2022. Patient to return to clinic in 6 months following the CT scan to review. If she does not like the Trelegy inhaler she can switch back to her previous.  This was discussed with her today in the office.  Inhaler instruction: Patient was instructed how to use the new Ellipta device.  I used a placebo device today in the office to give her an example of how to appropriately use the Ellipta device.  I spent 30 minutes dedicated to the care of this patient on the date of this encounter to include pre-visit review of records, face-to-face time with the patient discussing conditions above, post visit ordering of testing, clinical documentation with the electronic health record, making appropriate referrals as documented, and communicating necessary findings to members of the patients care team.    Garner Nash, DO [facility] 03/26/2020 4:44 PM

## 2020-03-27 MED ORDER — TRELEGY ELLIPTA 100-62.5-25 MCG/INH IN AEPB: 1.0000 | INHALATION_SPRAY | Freq: Every day | RESPIRATORY_TRACT | 0 refills | Status: DC

## 2020-03-27 NOTE — Addendum Note (Signed)
Addended by: Marcellus Scott on: 03/27/2020 08:48 AM   Modules accepted: Orders

## 2020-04-03 ENCOUNTER — Ambulatory Visit
Admission: EM | Admit: 2020-04-03 | Discharge: 2020-04-03 | Disposition: A | Discharge status: Home / Self Care | Payer: Medicare Other | Attending: Emergency Medicine | Admitting: Emergency Medicine

## 2020-04-03 ENCOUNTER — Other Ambulatory Visit: Payer: Self-pay

## 2020-04-03 DIAGNOSIS — J069 Acute upper respiratory infection, unspecified: Secondary | ICD-10-CM | POA: Diagnosis not present

## 2020-04-03 DIAGNOSIS — Z1152 Encounter for screening for COVID-19: Secondary | ICD-10-CM

## 2020-04-03 MED ORDER — ALBUTEROL SULFATE HFA 108 (90 BASE) MCG/ACT IN AERS: 2.0000 | INHALATION_SPRAY | Freq: Four times a day (QID) | RESPIRATORY_TRACT | 2 refills | Status: DC | PRN

## 2020-04-03 MED ORDER — FLUTICASONE PROPIONATE 50 MCG/ACT NA SUSP: 1.0000 | Freq: Every day | NASAL | 0 refills | Status: DC

## 2020-04-03 MED ORDER — AEROCHAMBER PLUS FLO-VU MEDIUM MISC: 1.0000 | Freq: Once | 0 refills | Status: AC

## 2020-04-03 MED ORDER — BENZONATATE 100 MG PO CAPS: 100.0000 mg | ORAL_CAPSULE | Freq: Three times a day (TID) | ORAL | 0 refills | Status: DC

## 2020-04-03 MED ORDER — PREDNISONE 50 MG PO TABS: 50.0000 mg | ORAL_TABLET | Freq: Every day | ORAL | 0 refills | Status: DC

## 2020-04-03 MED ORDER — MONTELUKAST SODIUM 10 MG PO TABS: 10.0000 mg | ORAL_TABLET | Freq: Every day | ORAL | 0 refills | Status: DC

## 2020-04-03 NOTE — ED Provider Notes (Signed)
[facility]    CSN: [id number] Arrival date & time: 04/03/20  5009      History   Chief Complaint Chief Complaint  Patient presents with  . Cough    x 3 days  . Nasal Congestion    x 3 days    HPI Vickie Bowman is a 69 y.o. female  With history as below presenting for 3-day course of URI symptoms.  Endorsing dry cough with nasal congestion and rhinorrhea.  Has had increased dyspnea with relief through ProAir inhaler.  Requesting refill she is out.  Patient reports compliance with 2 L O2 via nasal cannula, continuous.  Has been chronic/stable on this for 2 years due to history of COPD.  Recently had pulmonary appointment: No change in medications.  Denying chest pain, palpitations, weakness or fatigue.  Past Medical History:  Diagnosis Date  . Anxiety   . CHF (congestive heart failure) (HCC)   . COPD (chronic obstructive pulmonary disease) (HCC)   . Lumbar herniated disc   . Tobacco abuse     Patient Active Problem List   Diagnosis Date Noted  . Stage 2 moderate COPD by GOLD classification (HCC) 03/26/2020  . Chronic respiratory failure with hypoxia (HCC) 06/21/2018  . COPD (chronic obstructive pulmonary disease) (HCC) 03/06/2018  . CAD (coronary artery disease) 03/05/2018  . Elevated troponin   . COPD exacerbation (HCC) 02/03/2018  . Anxiety 02/03/2018  . Acute HFrEF (heart failure with reduced ejection fraction) (HCC) 02/03/2018  . Non-ST elevation (NSTEMI) myocardial infarction (HCC)   . Hyperlipidemia LDL goal <70   . Underweight due to inadequate caloric intake   . Tobacco abuse, in remission   . Cardiomyopathy (HCC)   . Protein-calorie malnutrition, severe 02/02/2018  . Acute respiratory failure with hypoxia (HCC) 02/01/2018    Past Surgical History:  Procedure Laterality Date  . LEFT HEART CATH AND CORONARY ANGIOGRAPHY N/A 02/05/2018   Procedure: LEFT HEART CATH AND CORONARY ANGIOGRAPHY;  Surgeon: Yvonne Kendall, MD;  Location: [facility];  Service: Cardiovascular;  Laterality: N/A;    OB History   No obstetric history on file.      Home Medications    Prior to Admission medications   Medication Sig Start Date End Date Taking? Authorizing Provider  ALPRAZolam Prudy Feeler) 0.25 MG tablet Take 1 tablet by mouth 2 (two) times daily. 02/21/18  Yes [provider]  ALPRAZolam Prudy Feeler) 1 MG tablet Take 1 mg by mouth 3 (three) times daily as needed. 03/19/20  Yes [provider]  aspirin EC 81 MG EC tablet Take 1 tablet (81 mg total) by mouth daily. 02/06/18  Yes Ghimire, Werner Lean, MD  Cholecalciferol (VITAMIN D) 2000 units CAPS Take 1 capsule (2,000 Units total) by mouth daily. 03/27/18  Yes Icard, Rachel Bo, DO  Fluticasone-Umeclidin-Vilant (TRELEGY ELLIPTA) 100-62.5-25 MCG/INH AEPB Inhale 1 puff into the lungs daily. 03/27/20  Yes Icard, Rachel Bo, DO  HYDROcodone-acetaminophen (NORCO/VICODIN) 5-325 MG tablet Take 1 tablet by mouth 2 (two) times daily as needed for pain. 11/20/17  Yes [provider]  meloxicam (MOBIC) 7.5 MG tablet Take 7.5 mg by mouth daily. 01/28/20  Yes [provider]  metoprolol succinate (TOPROL-XL) 25 MG 24 hr tablet Take 0.5 tablets (12.5 mg total) by mouth daily. 02/06/18  Yes Ghimire, Werner Lean, MD  PROAIR HFA 108 [phone] Base) MCG/ACT inhaler Inhale 2 puffs into the lungs 4 (four) times daily as needed for wheezing. 03/27/18  Yes Icard, Rachel Bo, DO  Spacer/Aero-Holding Chambers (AEROCHAMBER MV) inhaler Use as instructed 06/06/18  Yes Icard, Bradley L, DO  albuterol (VENTOLIN HFA) 108 (90 Base) MCG/ACT inhaler Inhale 2 puffs into the lungs every 6 (six) hours as needed for wheezing or shortness of breath. 04/03/20   Hall-Potvin, [place], PA-C  benzonatate (TESSALON) 100 MG capsule Take 1 capsule (100 mg total) by mouth every 8 (eight) hours. 04/03/20   Hall-Potvin, [place], PA-C  fluticasone (FLONASE) 50 MCG/ACT nasal spray Place 1 spray into both nostrils daily. 04/03/20    Hall-Potvin, [place], PA-C  montelukast (SINGULAIR) 10 MG tablet Take 1 tablet (10 mg total) by mouth at bedtime. 04/03/20   Hall-Potvin, [place], PA-C  predniSONE (DELTASONE) 50 MG tablet Take 1 tablet (50 mg total) by mouth daily with breakfast. 04/03/20   Hall-Potvin, [place], PA-C  rosuvastatin (CRESTOR) 20 MG tablet Take 1 tablet (20 mg total) by mouth daily. 06/21/19 09/19/19  Hilty, Lisette AbuKenneth C, MD  Spacer/Aero-Holding Chambers (AEROCHAMBER PLUS FLO-VU MEDIUM) MISC 1 each by Other route once for 1 dose. 04/03/20 04/03/20  Hall-Potvin, [place], PA-C  budesonide-formoterol (SYMBICORT) 160-4.5 MCG/ACT inhaler Inhale 2 puffs into the lungs every 12 (twelve) hours. Patient not taking: Reported on 03/26/2020 06/06/18 04/03/20  Josephine IgoIcard, Bradley L, DO  Tiotropium Bromide Monohydrate (SPIRIVA RESPIMAT) 2.5 MCG/ACT AERS Inhale 2 puffs into the lungs daily. 03/14/19 04/03/20  Josephine IgoIcard, Bradley L, DO    Family History Family History  Problem Relation Age of Onset  . Liver cancer Mother   . Breast cancer Sister   . CAD Neg Hx     Social History Social History   Tobacco Use  . Smoking status: Former Smoker    Packs/day: 1.50    Years: 53.00    Pack years: 79.50    Start date: 05/24/1967    Quit date: 01/07/2018    Years since quitting: 2.2  . Smokeless tobacco: Never Used  . Tobacco comment: Smoked since age 69, quit 12/2017  Vaping Use  . Vaping Use: Never used  Substance Use Topics  . Alcohol use: Yes    Comment: Rare, glass of wine no more than every few weeks  . Drug use: Never     Allergies   Penicillins   Review of Systems Review of Systems  Constitutional: Negative for activity change, appetite change, fatigue and fever.  HENT: Positive for congestion, postnasal drip and rhinorrhea. Negative for ear pain, sinus pain, sore throat and voice change.   Eyes: Negative for pain, redness and visual disturbance.  Respiratory: Positive for cough and chest tightness. Negative for  shortness of breath.   Cardiovascular: Negative for chest pain and palpitations.  Gastrointestinal: Negative for abdominal pain, diarrhea and vomiting.  Musculoskeletal: Negative for arthralgias and myalgias.  Skin: Negative for rash and wound.  Neurological: Negative for syncope and headaches.     Physical Exam Triage Vital Signs ED Triage Vitals  Enc Vitals Group     BP 04/03/20 0943 122/87     Pulse Rate 04/03/20 0943 (!) 102     Resp 04/03/20 0943 19     Temp 04/03/20 0943 98.5 F (36.9 C)     Temp Source 04/03/20 0943 Oral     SpO2 04/03/20 0943 96 %     Weight --      Height --      Head Circumference --      Peak Flow --      Pain Score 04/03/20 0947 3     Pain Loc --  Pain Edu? --      Excl. in GC? --    No data found.  Updated Vital Signs BP 122/87 (BP Location: Left Arm)   Pulse (!) 102   Temp 98.5 F (36.9 C) (Oral)   Resp 19   SpO2 96%   Visual Acuity Right Eye Distance:   Left Eye Distance:   Bilateral Distance:    Right Eye Near:   Left Eye Near:    Bilateral Near:     Physical Exam Constitutional:      General: She is not in acute distress.    Appearance: She is not ill-appearing or diaphoretic.  HENT:     Head: Normocephalic and atraumatic.     Right Ear: Tympanic membrane and ear canal normal.     Left Ear: Tympanic membrane and ear canal normal.     Nose: Rhinorrhea present.     Mouth/Throat:     Mouth: Mucous membranes are moist.     Pharynx: Oropharynx is clear. No oropharyngeal exudate or posterior oropharyngeal erythema.  Eyes:     General: No scleral icterus.    Conjunctiva/sclera: Conjunctivae normal.     Pupils: Pupils are equal, round, and reactive to light.  Neck:     Comments: Trachea midline, negative JVD Cardiovascular:     Rate and Rhythm: Normal rate and regular rhythm.     Heart sounds: No murmur heard.  No gallop.   Pulmonary:     Effort: Pulmonary effort is normal. No respiratory distress.     Breath  sounds: Normal breath sounds. No wheezing, rhonchi or rales.     Comments: Good air entry bilaterally without prolonged expiratory phase. Musculoskeletal:        General: Normal range of motion.     Cervical back: Neck supple. No tenderness.     Right lower leg: No edema.     Left lower leg: No edema.  Lymphadenopathy:     Cervical: No cervical adenopathy.  Skin:    Capillary Refill: Capillary refill takes less than 2 seconds.     Coloration: Skin is not jaundiced or pale.     Findings: No rash.  Neurological:     General: No focal deficit present.     Mental Status: She is alert and oriented to person, place, and time.      UC Treatments / Results  Labs (all labs ordered are listed, but only abnormal results are displayed) Labs Reviewed  NOVEL CORONAVIRUS, NAA    EKG   Radiology No results found.  Procedures Procedures (including critical care time)  Medications Ordered in UC Medications - No data to display  Initial Impression / Assessment and Plan / UC Course  I have reviewed the triage vital signs and the nursing notes.  Pertinent labs & imaging results that were available during my care of the patient were reviewed by me and considered in my medical decision making (see chart for details).     Patient afebrile, nontoxic, with SpO2 96%.  Covid PCR pending.  Patient to quarantine until results are back.  We will treat supportively as outlined below.  Return precautions discussed, patient verbalized understanding and is agreeable to plan. Final Clinical Impressions(s) / UC Diagnoses   Final diagnoses:  Encounter for screening for COVID-19  URI with cough and congestion     Discharge Instructions     Tessalon for cough. Start flonase, atrovent nasal spray for nasal congestion/drainage. You can use over the counter nasal saline rinse  such as neti pot for nasal congestion. Keep hydrated, your urine should be clear to pale yellow in color. Tylenol/motrin for  fever and pain. Monitor for any worsening of symptoms, chest pain, shortness of breath, wheezing, swelling of the throat, go to the emergency department for further evaluation needed.     ED Prescriptions    Medication Sig Dispense Auth. Provider   montelukast (SINGULAIR) 10 MG tablet Take 1 tablet (10 mg total) by mouth at bedtime. 30 tablet Hall-Potvin, [place], PA-C   predniSONE (DELTASONE) 50 MG tablet Take 1 tablet (50 mg total) by mouth daily with breakfast. 5 tablet Hall-Potvin, [place], PA-C   albuterol (VENTOLIN HFA) 108 (90 Base) MCG/ACT inhaler Inhale 2 puffs into the lungs every 6 (six) hours as needed for wheezing or shortness of breath. 8 g Hall-Potvin, [place], PA-C   Spacer/Aero-Holding Chambers (AEROCHAMBER PLUS FLO-VU MEDIUM) MISC 1 each by Other route once for 1 dose. 1 each Hall-Potvin, [place], PA-C   benzonatate (TESSALON) 100 MG capsule Take 1 capsule (100 mg total) by mouth every 8 (eight) hours. 21 capsule Hall-Potvin, [place], PA-C   fluticasone (FLONASE) 50 MCG/ACT nasal spray Place 1 spray into both nostrils daily. 16 g Hall-Potvin, [place], PA-C     PDMP not reviewed this encounter.   Hall-Potvin, [place], [place] 04/03/20 1024

## 2020-04-03 NOTE — ED Triage Notes (Signed)
Pt states she has had a cough and congestion for 3 days and is somewhat short of breath. Pt is on 2 L of oxygen nasal cannula for 2 years due to COPD. Pt is aox4 and ambulatory.

## 2020-04-03 NOTE — Discharge Instructions (Addendum)

## 2020-04-04 LAB — NOVEL CORONAVIRUS, NAA: SARS-CoV-2, NAA: DETECTED — AB

## 2020-04-04 LAB — SARS-COV-2, NAA 2 DAY TAT

## 2020-04-05 ENCOUNTER — Other Ambulatory Visit: Payer: Self-pay | Admitting: Nurse Practitioner

## 2020-04-05 DIAGNOSIS — U071 COVID-19: Secondary | ICD-10-CM

## 2020-04-05 NOTE — Progress Notes (Signed)
I connected by phone with Sherrilee Gilles on 04/05/2020 at 11:05 AM to discuss the potential use of a treatment for mild to moderate COVID-19 viral infection in non-hospitalized patients.  This patient is a 69 y.o. female that meets the FDA criteria for Emergency Use Authorization of bamlanivimab/etesevimab, casirivimab\imdevimab, or sotrovimab  Has a (+) direct SARS-CoV-2 viral test result  Has mild or moderate COVID-19   Is ? 69 years of age and weighs ? 40 kg  Is NOT hospitalized due to COVID-19  Is NOT requiring oxygen therapy or requiring an increase in baseline oxygen flow rate due to COVID-19  Is within 10 days of symptom onset  Has at least one of the high risk factor(s) for progression to severe COVID-19 and/or hospitalization as defined in EUA.  Specific high risk criteria : Older age (>/= 71 yo), Cardiovascular disease or hypertension and Chronic Lung Disease   I have spoken and communicated the following to the patient or parent/caregiver:  1. FDA has authorized the emergency use of bamlanivimab/etesevimab, casirivimab\imdevimab, or sotrovimab for the treatment of mild to moderate COVID-19 in adults and pediatric patients with positive results of direct SARS-CoV-2 viral testing who are 24 years of age and older weighing at least 40 kg, and who are at high risk for progressing to severe COVID-19 and/or hospitalization.  2. The significant known and potential risks and benefits of bamlanivimab/etesevimab, casirivimab\imdevimab, or sotrovimab, and the extent to which such potential risks and benefits are unknown.  3. Information on available alternative treatments and the risks and benefits of those alternatives, including clinical trials.  4. Patients treated with bamlanivimab/etesevimab, casirivimab\imdevimab, or sotrovimab should continue to self-isolate and use infection control measures (e.g., wear mask, isolate, social distance, avoid sharing personal items, clean and  disinfect "high touch" surfaces, and frequent handwashing) according to CDC guidelines.   5. The patient or parent/caregiver has the option to accept or refuse bamlanivimab/etesevimab, casirivimab\imdevimab, or sotrovimab.  After reviewing this information with the patient, the patient has agreed to receive one of the available covid 19 monoclonal antibodies and will be provided an appropriate fact sheet prior to infusion.Consuello Masse, DNP, AGNP-C [phone] (Infusion Center Hotline)

## 2020-04-06 ENCOUNTER — Ambulatory Visit (HOSPITAL_COMMUNITY)
Admission: RE | Admit: 2020-04-06 | Discharge: 2020-04-06 | Disposition: A | Discharge status: Home / Self Care | Payer: Medicare Other | Source: Ambulatory Visit | Attending: Pulmonary Disease | Admitting: Pulmonary Disease

## 2020-04-06 DIAGNOSIS — Z23 Encounter for immunization: Secondary | ICD-10-CM | POA: Insufficient documentation

## 2020-04-06 DIAGNOSIS — U071 COVID-19: Secondary | ICD-10-CM | POA: Diagnosis present

## 2020-04-06 MED ORDER — DIPHENHYDRAMINE HCL 50 MG/ML IJ SOLN: 50.0000 mg | Freq: Once | INTRAMUSCULAR | Status: DC | PRN

## 2020-04-06 MED ORDER — METHYLPREDNISOLONE SODIUM SUCC 125 MG IJ SOLR: 125.0000 mg | Freq: Once | INTRAMUSCULAR | Status: DC | PRN

## 2020-04-06 MED ORDER — SODIUM CHLORIDE 0.9 % IV SOLN: INTRAVENOUS | Status: DC | PRN

## 2020-04-06 MED ORDER — EPINEPHRINE 0.3 MG/0.3ML IJ SOAJ: 0.3000 mg | Freq: Once | INTRAMUSCULAR | Status: DC | PRN

## 2020-04-06 MED ORDER — ALBUTEROL SULFATE HFA 108 (90 BASE) MCG/ACT IN AERS: 2.0000 | INHALATION_SPRAY | Freq: Once | RESPIRATORY_TRACT | Status: DC | PRN

## 2020-04-06 MED ORDER — FAMOTIDINE IN NACL 20-0.9 MG/50ML-% IV SOLN: 20.0000 mg | Freq: Once | INTRAVENOUS | Status: DC | PRN

## 2020-04-06 MED ORDER — SOTROVIMAB 500 MG/8ML IV SOLN
500.0000 mg | Freq: Once | INTRAVENOUS | Status: AC
  Administered 2020-04-06: 500 mg via INTRAVENOUS

## 2020-04-06 NOTE — Progress Notes (Signed)
  Diagnosis: COVID-19  Physician:Dr Wright  Procedure:sotrovimab  Complications: No immediate complications noted.  Discharge: Discharged home   Vickie Bowman 04/06/2020  

## 2020-04-06 NOTE — Discharge Instructions (Signed)

## 2020-06-04 ENCOUNTER — Other Ambulatory Visit: Payer: Self-pay | Admitting: Internal Medicine

## 2020-06-04 DIAGNOSIS — E785 Hyperlipidemia, unspecified: Secondary | ICD-10-CM

## 2020-06-15 ENCOUNTER — Telehealth: Payer: Self-pay | Admitting: Pulmonary Disease

## 2020-06-16 ENCOUNTER — Ambulatory Visit: Payer: Medicare Other | Admitting: Internal Medicine

## 2020-06-16 NOTE — Telephone Encounter (Signed)
Patient states would like to stay with [employer] [facility] for oxygen needs. Patient phone number is [phone] h or [phone] c.

## 2020-06-16 NOTE — Telephone Encounter (Signed)
Called and spoke to pt. Pt states she would like to change to another DME as there have been several issues with her concentrator and needing replacements and repairs. Advised her we could try and resolve any issues with [employer] before changing to a new DME. Pt states she will think about staying with [employer] but requests to know why she keeps having issues with her home concentrator.   Community message has been sent to [facility] with [employer] to get more information.

## 2020-06-18 NOTE — Telephone Encounter (Signed)
Called and spoke to pt. Pt states she will stay with [employer] and will call back if things do not improved. Nothing further needed at this time.

## 2020-07-01 ENCOUNTER — Other Ambulatory Visit: Payer: Self-pay

## 2020-07-01 ENCOUNTER — Encounter: Payer: Self-pay | Admitting: Internal Medicine

## 2020-07-01 ENCOUNTER — Ambulatory Visit (INDEPENDENT_AMBULATORY_CARE_PROVIDER_SITE_OTHER): Payer: Medicare Other | Admitting: Internal Medicine

## 2020-07-01 VITALS — BP 118/72 | HR 63 | Ht 68.0 in | Wt 138.0 lb

## 2020-07-01 DIAGNOSIS — I5181 Takotsubo syndrome: Secondary | ICD-10-CM | POA: Diagnosis not present

## 2020-07-01 DIAGNOSIS — R0602 Shortness of breath: Secondary | ICD-10-CM | POA: Diagnosis not present

## 2020-07-01 DIAGNOSIS — E782 Mixed hyperlipidemia: Secondary | ICD-10-CM

## 2020-07-01 DIAGNOSIS — J449 Chronic obstructive pulmonary disease, unspecified: Secondary | ICD-10-CM

## 2020-07-01 DIAGNOSIS — I2510 Atherosclerotic heart disease of native coronary artery without angina pectoris: Secondary | ICD-10-CM | POA: Diagnosis not present

## 2020-07-01 NOTE — Patient Instructions (Signed)
Medication Instructions:   NO CHANGES EXCEPT - TAKE 2 WEEK HOLIDAY  ( STOP TAKING ROUSVASTATIN - CONTACT THE OFFICE TO LET us KNOW HOW YOU FEEL  *If you need a refill on your cardiac medications before your next appointment, please call your pharmacy*   Lab Work:  today  Non fasting CBC CMP BNP Lipid  If you have labs (blood work) drawn today and your tests are completely normal, you will receive your results only by: Marland Kitchen MyChart Message (if you have MyChart) OR . A paper copy in the mail If you have any lab test that is abnormal or we need to change your treatment, we will call you to review the results.   Testing/Procedures: not needed   Follow-Up: At [facility], you and your health needs are our priority.  As part of our continuing mission to provide you with exceptional heart care, we have created designated Provider Care Teams.  These Care Teams include your primary Cardiologist (physician) and Advanced Practice Providers (APPs -  Physician Assistants and Nurse Practitioners) who all work together to provide you with the care you need, when you need it.  We recommend signing up for the patient portal called "MyChart".  Sign up information is provided on this After Visit Summary.  MyChart is used to connect with patients for Virtual Visits (Telemedicine).  Patients are able to view lab/test results, encounter notes, upcoming appointments, etc.  Non-urgent messages can be sent to your provider as well.   To learn more about what you can do with MyChart, go to [email or web address].    Your next appointment:   12 month(s)  The format for your next appointment:   In Person  Provider:   K. Italy Hilty, MD   Other Instructions

## 2020-07-01 NOTE — Progress Notes (Signed)
OFFICE NOTE  Chief Complaint:  Hospital follow-up  Primary Care Physician: Aida Puffer, MD  HPI:  Vickie Bowman is a 70 y.o. female with a past medial history significant for hospitalization in September for acute dyspnea.  She was admitted for COPD exacerbation was found to have elevated troponin of 1.55.  Cardiology was consulted and echo showed an EF 35 to 40%.  I recommended heart catheterization for her.  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  LVEF at the time was up to 50 to 55%.  Was subsequently seen by Azalee Course, PA-C, and follow-up and a limited echo was ordered.  She was noted to be on atorvastatin 40 mg daily.  A fasting lipid profile LFTs were also ordered.  Recently the labs resulted and she did have marked improvement in her lipid profile with a total cholesterol 143 and LDL of 52, decreased from 889.  Unfortunately, she had an acute rise in AST and ALT to 97 and 185 respectively from 32 and 25.  This suggest there may be underlying steatohepatitis.  She remains asymptomatic with respect to that.  She is followed in pulmonary has an upcoming appointment in 2 weeks.  She is noted to be on oxygen.  Otherwise besides shortness of breath denies any chest pain.  06/17/2019  Vickie Bowman returns today for follow-up.  Her main issues continue to be with COPD.  She is on oxygen but has been off of cigarettes for about 2 years.  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  I advised long-term beta-blocker as well as low-dose aspirin.  She did have some coronary disease and was on atorvastatin however had elevated liver enzymes.  Based on that it was discontinued.  Since then her cholesterol apparently is gone up however I do not have access to those records at this time.  We will request those from her primary care provider.  07/02/2019  Vickie Bowman is seen today in follow-up.  Unfortunately she had  COVID-19 in November 2021.  Since then she says she has been a little more fatigued and short of breath.  She says she tires real easily as well.  She is on continuous oxygen.  She uses a finger oximeter at home and her oxygen levels have been over 90%.  She is also been having some leg pains and achiness bilaterally.  She is not sure what this could be related to however I offered it might be due to her statin.  She had previously taken atorvastatin but had issues with liver enzyme elevation.  Rosuvastatin is not cause that issue but it could be causing her pain.  We talked about a 2-week statin holiday to see if this resolves.  As mentioned above her last echo in 2020 showed normalization of her LVEF however she has a history of cardiomyopathy in the past.  PMHx:  Past Medical History:  Diagnosis Date  . Anxiety   . CHF (congestive heart failure) (HCC)   . COPD (chronic obstructive pulmonary disease) (HCC)   . Lumbar herniated disc   . Tobacco abuse     Past Surgical History:  Procedure Laterality Date  . LEFT HEART CATH AND CORONARY ANGIOGRAPHY N/A 02/05/2018   Procedure: LEFT HEART CATH AND CORONARY ANGIOGRAPHY;  Surgeon: Yvonne Kendall, MD;  Location: [facility];  Service: Cardiovascular;  Laterality: N/A;    FAMHx:  Family History  Problem Relation  Age of Onset  . Liver cancer Mother   . Breast cancer Sister   . CAD Neg Hx     SOCHx:   reports that she quit smoking about 2 years ago. She started smoking about 53 years ago. She has a 79.50 pack-year smoking history. She has never used smokeless tobacco. She reports current alcohol use. She reports that she does not use drugs.  ALLERGIES:  Allergies  Allergen Reactions  . Penicillins Swelling    Has patient had a PCN reaction causing immediate rash, facial/tongue/throat swelling, SOB or lightheadedness with hypotension: Yes Has patient had a PCN reaction causing severe rash involving mucus membranes or skin necrosis:  No Has patient had a PCN reaction that required hospitalization: No Has patient had a PCN reaction occurring within the last 10 years: No If all of the above answers are "NO", then may proceed with Cephalosporin use.     ROS: Pertinent items noted in HPI and remainder of comprehensive ROS otherwise negative.  HOME MEDS: Current Outpatient Medications on File Prior to Visit  Medication Sig Dispense Refill  . albuterol (VENTOLIN HFA) 108 (90 Base) MCG/ACT inhaler Inhale 2 puffs into the lungs every 6 (six) hours as needed for wheezing or shortness of breath. 8 g 2  . ALPRAZolam (XANAX) 1 MG tablet Take 1 mg by mouth 3 (three) times daily as needed.    Marland Kitchen aspirin EC 81 MG EC tablet Take 1 tablet (81 mg total) by mouth daily. 30 tablet 0  . BREZTRI AEROSPHERE 160-9-4.8 MCG/ACT AERO Inhale 2 puffs into the lungs in the morning and at bedtime.    . Cholecalciferol (VITAMIN D) 2000 units CAPS Take 1 capsule (2,000 Units total) by mouth daily. 30 capsule 5  . fluticasone (FLONASE) 50 MCG/ACT nasal spray Place 1 spray into both nostrils daily. 16 g 0  . HYDROcodone-acetaminophen (NORCO/VICODIN) 5-325 MG tablet Take 1 tablet by mouth 2 (two) times daily as needed for pain.  0  . meloxicam (MOBIC) 7.5 MG tablet Take 7.5 mg by mouth daily.    . metoprolol succinate (TOPROL-XL) 25 MG 24 hr tablet Take 0.5 tablets (12.5 mg total) by mouth daily. 60 tablet 0  . rosuvastatin (CRESTOR) 20 MG tablet Take 1 tablet by mouth once daily 90 tablet 0  . PROAIR HFA 108 (90 Base) MCG/ACT inhaler Inhale 2 puffs into the lungs 4 (four) times daily as needed for wheezing. 1 Inhaler 5  . Spacer/Aero-Holding Chambers (AEROCHAMBER MV) inhaler Use as instructed 1 each 0   No current facility-administered medications on file prior to visit.    LABS/IMAGING: No results found for this or any previous visit (from the past 48 hour(s)). No results found.  LIPID PANEL:    Component Value Date/Time   CHOL 129 09/09/2019  1126   TRIG 121 09/09/2019 1126   HDL 54 09/09/2019 1126   CHOLHDL 2.4 09/09/2019 1126   CHOLHDL 3.0 02/02/2018 0624   VLDL 9 02/02/2018 0624   LDLCALC 54 09/09/2019 1126     WEIGHTS: Wt Readings from Last 3 Encounters:  07/01/20 138 lb (62.6 kg)  03/26/20 144 lb 8 oz (65.5 kg)  10/02/19 143 lb 6.4 oz (65 kg)    VITALS: BP 118/72   Pulse 63   Ht 5\' 8"  (1.727 m)   Wt 138 lb (62.6 kg)   BMI 20.98 kg/m   EXAM: General appearance: alert, no distress and On oxygen Neck: no carotid bruit, no JVD and thyroid not enlarged, symmetric,  no tenderness/mass/nodules Lungs: diminished breath sounds bilaterally and rhonchi bilaterally Heart: regular rate and rhythm, S1, S2 normal, no murmur, click, rub or gallop Abdomen: soft, non-tender; bowel sounds normal; no masses,  no organomegaly and Scaphoid Extremities: extremities normal, atraumatic, no cyanosis or edema Pulses: 2+ and symmetric Skin: Skin color, texture, turgor normal. No rashes or lesions Neurologic: Grossly normal : Pleasant  EKG: Sinus rhythm at 62-personally reviewed  ASSESSMENT: 1. Progressive dyspnea and fatigue 2. Recent COVID-19 infection (03/2020) 3. History of acute systolic congestive heart failure, LVEF 35 to 40%-improved to 55 to 60% (05/2018) 4. Mild nonobstructive coronary disease (01/2018) 5. COPD on oxygen  6. Elevated LFTs on statin  PLAN: 1.   Vickie Bowman has had some progressive dyspnea and fatigue despite being on oxygen.  Her EF was 55 to 60% in 2020 however she had systolic congestive heart failure in the past.  She had COVID-19 in November and certainly her symptoms might be due to lingering effects of the virus however it is also possible she could have developed some Covid cardiomyopathy.  Exam does not really support any volume overload today.  I would like to go ahead and get lab work including metabolic profile, CBC and BMP as well as a nonfasting lipid profile.  If there is some sign of volume  overload then I will get another echocardiogram.  Otherwise follow-up annually or sooner as necessary.  Chrystie Nose, MD, [facility], FACP  [place]  [facility]  Medical Director of the [facility]  Card[facility] Reduction Clinic Diplomate of the American Board of Clinical Lipidology Attending Cardiologist  Direct Dial: [phone]  Fax: [phone]  Website:  [email or web address] 07/01/2020, 1:45 PM

## 2020-07-02 LAB — COMPREHENSIVE METABOLIC PANEL
ALT: 19 IU/L (ref 0–32)
AST: 18 IU/L (ref 0–40)
Albumin/Globulin Ratio: 2.6 — ABNORMAL HIGH (ref 1.2–2.2)
Albumin: 4.5 g/dL (ref 3.7–4.7)
Alkaline Phosphatase: 85 IU/L (ref 44–121)
BUN/Creatinine Ratio: 18 (ref 12–28)
BUN: 15 mg/dL (ref 8–27)
Bilirubin Total: 0.4 mg/dL (ref 0.0–1.2)
CO2: 23 mmol/L (ref 20–29)
Calcium: 9.6 mg/dL (ref 8.7–10.3)
Chloride: 106 mmol/L (ref 96–106)
Creatinine, Ser: 0.85 mg/dL (ref 0.57–1.00)
GFR calc Af Amer: 79 mL/min/{1.73_m2} (ref >59)
GFR calc non Af Amer: 69 mL/min/{1.73_m2} (ref >59)
Globulin, Total: 1.7 g/dL (ref 1.5–4.5)
Glucose: 89 mg/dL (ref 65–99)
Potassium: 4.8 mmol/L (ref 3.5–5.2)
Sodium: 144 mmol/L (ref 134–144)
Total Protein: 6.2 g/dL (ref 6.0–8.5)

## 2020-07-02 LAB — CBC
Hematocrit: 41.2 % (ref 34.0–46.6)
Hemoglobin: 13.6 g/dL (ref 11.1–15.9)
MCH: 29.8 pg (ref 26.6–33.0)
MCHC: 33 g/dL (ref 31.5–35.7)
MCV: 90 fL (ref 79–97)
Platelets: 192 10*3/uL (ref 150–450)
RBC: 4.56 x10E6/uL (ref 3.77–5.28)
RDW: 13.1 % (ref 11.7–15.4)
WBC: 5.7 10*3/uL (ref 3.4–10.8)

## 2020-07-02 LAB — LIPID PANEL
Chol/HDL Ratio: 2.2 ratio (ref 0.0–4.4)
Cholesterol, Total: 129 mg/dL (ref 100–199)
HDL: 59 mg/dL (ref >39)
LDL Chol Calc (NIH): 53 mg/dL (ref 0–99)
Triglycerides: 86 mg/dL (ref 0–149)
VLDL Cholesterol Cal: 17 mg/dL (ref 5–40)

## 2020-07-02 LAB — BRAIN NATRIURETIC PEPTIDE: BNP: 41.8 pg/mL (ref 0.0–100.0)

## 2020-07-03 ENCOUNTER — Telehealth: Payer: Self-pay | Admitting: Pulmonary Disease

## 2020-07-03 NOTE — Telephone Encounter (Signed)
Call returned to patient, confirmed DOB. Patient is requesting to change her DME company. She currently uses [employer] and she is not happy with the services.   She would like to switch to [facility] [phone]  I inquired as to when she got her oxygen as we will need qualifying oxygen parameters in order to switch her DME companies. She reports she received oxygen while in the hospital 3 years ago.   Appt made on schedule for qualifying walk so patient can change DME companies.   Will route message to provider nurse as Lorain Childes to f/u once qualifying walk has been done.

## 2020-07-03 NOTE — Telephone Encounter (Signed)
I have spoke with Vickie Bowman and told her that she wasn't due until April but if she wanted to go ahead and get her CT schedule we could.  She is aware that her LCS CT has been scheduled on 09/04/20 @ 1:30pm at [facility] [address] location

## 2020-07-03 NOTE — Telephone Encounter (Signed)
Vickie Bowman, can you contact pt to schedule f/u low dose CT?  

## 2020-07-08 ENCOUNTER — Encounter: Payer: Self-pay | Admitting: Internal Medicine

## 2020-07-08 NOTE — Telephone Encounter (Signed)
error 

## 2020-07-13 NOTE — Telephone Encounter (Signed)
Patient appt to qualify for oxygen 07/17/20. Will f/u after appt.

## 2020-07-17 ENCOUNTER — Ambulatory Visit (INDEPENDENT_AMBULATORY_CARE_PROVIDER_SITE_OTHER): Payer: Medicare Other

## 2020-07-17 ENCOUNTER — Other Ambulatory Visit: Payer: Self-pay

## 2020-07-17 DIAGNOSIS — J9611 Chronic respiratory failure with hypoxia: Secondary | ICD-10-CM

## 2020-07-17 MED ORDER — BREZTRI AEROSPHERE 160-9-4.8 MCG/ACT IN AERO
2.0000 | INHALATION_SPRAY | Freq: Two times a day (BID) | RESPIRATORY_TRACT | 5 refills | Status: DC
Start: 2020-07-17 — End: 2020-12-30

## 2020-07-17 NOTE — Telephone Encounter (Signed)
Patient came in today for her qualifying walk. She did qualify for 2L of O2. She is already established with [employer] and wanted to switch over to [employer]. She decided today that she wishes to remain with [employer] since their customer service has improved since she last spoke with them.   I advise her that if she decides to switch to [facility], she will need to do so within the next 30 days so we can use the walk information from today. If she decides after the 30 days, we will need to complete another walk. She verbalized understanding of this and stated that she will think about it within the next 2 weeks.   She also requested a refill on her Breztri. This has been sent in to her pharmacy.

## 2020-08-04 ENCOUNTER — Ambulatory Visit: Payer: Medicare Other

## 2020-08-14 ENCOUNTER — Ambulatory Visit: Payer: Medicare Other | Admitting: Internal Medicine

## 2020-09-02 ENCOUNTER — Other Ambulatory Visit: Payer: Self-pay | Admitting: Internal Medicine

## 2020-09-02 DIAGNOSIS — E785 Hyperlipidemia, unspecified: Secondary | ICD-10-CM

## 2020-09-04 ENCOUNTER — Other Ambulatory Visit: Payer: Self-pay

## 2020-09-04 ENCOUNTER — Ambulatory Visit
Admission: RE | Admit: 2020-09-04 | Discharge: 2020-09-04 | Disposition: A | Discharge status: Home / Self Care | Payer: Medicare Other | Source: Ambulatory Visit | Attending: Acute Care | Admitting: Acute Care

## 2020-09-04 DIAGNOSIS — Z87891 Personal history of nicotine dependence: Secondary | ICD-10-CM

## 2020-09-18 NOTE — Progress Notes (Signed)
Please call patient and let them  know their  low dose Ct was read as a Lung RADS 1, negative study: no nodules or definitely benign nodules. Radiology recommendation is for a repeat LDCT in 12 months. .Please let them  know we will order and schedule their  annual screening scan for 08/2021. Please let them  know there was notation of CAD on their  scan.  Please remind the patient  that this is a non-gated exam therefore degree or severity of disease  cannot be determined. Please have them  follow up with their PCP regarding potential risk factor modification, dietary therapy or pharmacologic therapy if clinically indicated. Pt.  is currently on statin therapy. Please place order for annual  screening scan for  08/2021 and fax results to PCP. Thanks so much. 

## 2020-09-21 ENCOUNTER — Other Ambulatory Visit: Payer: Self-pay | Admitting: *Deleted

## 2020-09-21 DIAGNOSIS — Z87891 Personal history of nicotine dependence: Secondary | ICD-10-CM

## 2020-09-29 ENCOUNTER — Ambulatory Visit: Payer: Medicare Other | Admitting: Pulmonary Disease

## 2020-09-29 ENCOUNTER — Ambulatory Visit (INDEPENDENT_AMBULATORY_CARE_PROVIDER_SITE_OTHER): Payer: Medicare Other | Admitting: Adult Health

## 2020-09-29 ENCOUNTER — Encounter: Payer: Self-pay | Admitting: Adult Health

## 2020-09-29 ENCOUNTER — Other Ambulatory Visit: Payer: Self-pay

## 2020-09-29 DIAGNOSIS — J449 Chronic obstructive pulmonary disease, unspecified: Secondary | ICD-10-CM

## 2020-09-29 DIAGNOSIS — J9611 Chronic respiratory failure with hypoxia: Secondary | ICD-10-CM

## 2020-09-29 DIAGNOSIS — E43 Unspecified severe protein-calorie malnutrition: Secondary | ICD-10-CM | POA: Diagnosis not present

## 2020-09-29 DIAGNOSIS — Z01811 Encounter for preprocedural respiratory examination: Secondary | ICD-10-CM

## 2020-09-29 MED ORDER — PROAIR HFA 108 (90 BASE) MCG/ACT IN AERS: 2.0000 | INHALATION_SPRAY | Freq: Four times a day (QID) | RESPIRATORY_TRACT | 5 refills | Status: DC | PRN

## 2020-09-29 MED ORDER — IPRATROPIUM-ALBUTEROL 0.5-2.5 (3) MG/3ML IN SOLN: 3.0000 mL | Freq: Four times a day (QID) | RESPIRATORY_TRACT | 3 refills | Status: AC | PRN

## 2020-09-29 NOTE — Patient Instructions (Addendum)
Continue on Breztri 2 puffs Twice daily  , rinse after use.  Activity as tolerated.  Continue on Oxygen 2l/m .  Albuterol inhaler or Duoneb As needed   Yearly CT scans as planned -Lung cancer screening program  Follow up with Dr. Tonia Brooms in 6 months and As needed

## 2020-09-29 NOTE — Assessment & Plan Note (Signed)
Compensated on present regimen.  No changes Continue with yearly low-dose CT screening program  Plan  Patient Instructions  Continue on Breztri 2 puffs Twice daily  , rinse after use.  Activity as tolerated.  Continue on Oxygen 2l/m .  Albuterol inhaler or Duoneb As needed   Yearly CT scans as planned -Lung cancer screening program  Follow up with Dr. Tonia Brooms in 6 months and As needed

## 2020-09-29 NOTE — Assessment & Plan Note (Signed)
High-protein diet and activity as tolerated

## 2020-09-29 NOTE — Assessment & Plan Note (Signed)
Continue on oxygen 2 L 

## 2020-09-29 NOTE — Progress Notes (Signed)
_0  ID: Vickie Bowman, female    DOB: September 15, 1950, 70 y.o.   MRN: [id number]  Chief Complaint  Patient presents with  . Follow-up    Referring provider: Tamsen Roers, MD  HPI: 70 year old female former smoker followed for severe COPD and chronic hypoxic respiratory failure on oxygen  TEST/EVENTS :  03/04/2018 chest x-ray: Emphysematous lung changes The patient's images have been independently reviewed by me.    Low-dose lung cancer screening CT completed in March 2020: Lung RADS 1, considered negative recommending annual 89-monthfollow-up.  This is already been ordered. Additionally there was some findings of bronchiectasis with no lower lobe evidence of mucoid impaction possible underlying atypical infection such as MAC.  The patient's images have been independently reviewed by me.    09/04/2019: Lung cancer screening CT Lung RADS 1 - recommending 1 year follow-up evidence of emphysema and coronary artery disease. The patient's images have been independently reviewed by me.    Pulmonary Functions Testing Results: PFT Results Latest Ref Rng & Units 03/30/2018  FVC-Pre L 2.11  FVC-Predicted Pre % 100  FVC-Post L 2.31  FVC-Predicted Post % 109  Pre FEV1/FVC % % 41  Post FEV1/FCV % % 48  FEV1-Pre L 0.87  FEV1-Predicted Pre % 55  FEV1-Post L 1.10  DLCO uncorrected ml/min/mmHg 10.54  DLCO UNC% % 78  DLVA Predicted % 59  TLC L 8.24  TLC % Predicted % 213  RV % Predicted % 341   6MWT: SIX MIN WALK 06/06/2018  Medications Proair at 3pm, Aspirin 847mat 7:30, Vitamin D 2000 units, metoprolol 12.48m2mDulera 100, Spiriva 4m8mt 10am  Supplimental Oxygen during Test? (L/min) Yes  O2 Flow Rate 3  Type Continuous  Laps 9  Partial Lap (in Meters) 0  Baseline BP (sitting) 114/70  Baseline Heartrate 91  Baseline Dyspnea (Borg Scale) 3  Baseline Fatigue (Borg Scale) 3  Baseline SPO2 98  BP (sitting) 130/80  Heartrate 115  Dyspnea (Borg Scale) 9  Fatigue (Borg  Scale) 4  SPO2 95  BP (sitting) 122/70  Heartrate 101  SPO2 98  Stopped or Paused before Six Minutes No  Distance Completed 306  Tech Comments: Pt walked at a normal pace without stopping during the walk. Pt denied any complaints.    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 2. Normal left ventricular filling pressure. 3. Low normal left ventricular filling pressure with subtle apical hypokinesis (LVEF 50-55%).   09/29/2020 Follow up : COPD, O2 RF  Patient returns for a 6-mo46-monthow-up.  Patient has underlying moderate to severe COPD.  She has oxygen dependent respiratory failure.  She uses 2 L of oxygen. She is on BREZTRI Twice daily  .  She participates in the low-dose CT screening program.  Last CT was September 07, 2020 that showed a lung RADS 1.  There was notable severe emphysema.  No nodules noted.  Chronic complete right middle lobe atelectasis. Patient says overall breathing has been doing about the same. Gets winded with heavy activity and prolonged walking . Remains active at home , does shopping , drives, housework. She is widowed. She lives with brother.  No increased Oxygen demands. O2 sats 96% on 2l/m today in office.  Has POC when she goes outside the home.  Has had COVID vaccine x 2 , discussed getting her  booster.    Allergies  Allergen Reactions  . Penicillins Swelling    Has patient had a PCN reaction causing immediate rash, facial/tongue/throat swelling, SOB or lightheadedness with hypotension: Yes Has patient had a PCN reaction causing severe rash involving mucus membranes or skin necrosis: No Has patient had a PCN reaction that required hospitalization: No Has patient had a PCN reaction occurring within the last 10 years: No If all of the above  answers are "NO", then may proceed with Cephalosporin use.     Immunization History  Administered Date(s) Administered  . Fluad Quad(high Dose 65+) 02/22/2019  . Influenza-Unspecified 03/21/2018  . Pneumococcal Polysaccharide-23 06/21/2018    Past Medical History:  Diagnosis Date  . Anxiety   . CHF (congestive heart failure) ([place])   . COPD (chronic obstructive pulmonary disease) ([place])   . Lumbar herniated disc   . Tobacco abuse     Tobacco History: Social History   Tobacco Use  Smoking Status Former Smoker  . Packs/day: 1.50  . Years: 53.00  . Pack years: 79.50  . Start date: 05/24/1967  . Quit date: 01/07/2018  . Years since quitting: 2.7  Smokeless Tobacco Never Used  Tobacco Comment   Smoked since age 41, quit 12/2017   Counseling given: Not Answered Comment: Smoked since age 62, quit 12/2017   Outpatient Medications Prior to Visit  Medication Sig Dispense Refill  . ALPRAZolam (XANAX) 1 MG tablet Take 1 mg by mouth 3 (three) times daily as needed.    Marland Kitchen aspirin EC 81 MG EC tablet Take 1 tablet (81 mg total) by mouth daily. 30 tablet 0  . BREZTRI AEROSPHERE 160-9-4.8 MCG/ACT AERO Inhale 2 puffs into the lungs in the morning and at bedtime. 10.7 g 5  . Cholecalciferol (VITAMIN D) 2000 units CAPS Take 1 capsule (2,000 Units total) by mouth daily. 30 capsule 5  . HYDROcodone-acetaminophen (NORCO/VICODIN) 5-325 MG tablet Take 1 tablet by mouth 2 (two) times daily as needed for pain.  0  . meloxicam (MOBIC) 7.5 MG tablet Take 7.5 mg by mouth daily.    . metoprolol succinate (TOPROL-XL) 25 MG 24 hr tablet Take 0.5 tablets (12.5 mg total) by mouth daily. 60 tablet 0  . Spacer/Aero-Holding Chambers (AEROCHAMBER MV) inhaler Use as instructed 1 each 0  . albuterol (VENTOLIN HFA) 108 (90 Base) MCG/ACT inhaler Inhale 2 puffs into the lungs every 6 (six) hours as needed for wheezing or shortness of breath. 8 g 2  . PROAIR HFA 108 (90 Base) MCG/ACT inhaler Inhale 2 puffs into the  lungs 4 (four) times daily as needed for wheezing. 1 Inhaler 5  . fluticasone (FLONASE) 50 MCG/ACT nasal spray Place 1 spray into both nostrils daily. (Patient not taking: Reported on 09/29/2020) 16 g 0  . rosuvastatin (CRESTOR) 20 MG tablet Take 1 tablet by mouth once daily (Patient not taking: Reported on 09/29/2020) 90 tablet 0   No facility-administered medications prior to visit.     Review of Systems:   Constitutional:   No  weight loss, night sweats,  Fevers, chills, fatigue, or  lassitude.  HEENT:   No headaches,  Difficulty swallowing,  Tooth/dental problems, or  Sore throat,                No sneezing, itching, ear ache, nasal congestion, post nasal drip,   CV:  No chest pain,  Orthopnea, PND, swelling in lower extremities, anasarca, dizziness, palpitations, syncope.   GI  No heartburn, indigestion, abdominal pain, nausea,  vomiting, diarrhea, change in bowel habits, loss of appetite, bloody stools.   Resp: No shortness of breath with exertion or at rest.  No excess mucus, no productive cough,  No non-productive cough,  No coughing up of blood.  No change in color of mucus.  No wheezing.  No chest wall deformity  Skin: no rash or lesions.  GU: no dysuria, change in color of urine, no urgency or frequency.  No flank pain, no hematuria   MS:  No joint pain or swelling.  No decreased range of motion.  No back pain.    Physical Exam  BP 122/68 (BP Location: Left Arm, Cuff Size: Normal)   Pulse 71   Temp 98.3 F (36.8 C) (Temporal)   Ht _0  (1.727 m)   Wt 135 lb (61.2 kg)   SpO2 96% Comment: 2L  BMI 20.53 kg/m   GEN: A/Ox3; pleasant , NAD, on O2    HEENT:  [place]/AT,   , NOSE-clear, THROAT-clear, no lesions, no postnasal drip or exudate noted.   NECK:  Supple w/ fair ROM; no JVD; normal carotid impulses w/o bruits; no thyromegaly or nodules palpated; no lymphadenopathy.    RESP  Clear  P & A; w/o, wheezes/ rales/ or rhonchi. no accessory muscle use, no dullness to  percussion  CARD:  RRR, no m/r/g, no peripheral edema, pulses intact, no cyanosis or clubbing.  GI:   Soft & nt; nml bowel sounds; no organomegaly or masses detected.   Musco: Warm bil, no deformities or joint swelling noted.   Neuro: alert, no focal deficits noted.    Skin: Warm, no lesions or rashes     ProBNP No results found for: PROBNP  Imaging: CT CHEST LUNG CA SCREEN LOW DOSE W/O CM  Result Date: 09/07/2020 CLINICAL DATA:  70 year old asymptomatic female former smoker with 53 pack-year smoking history, quit smoking 3 years prior. EXAM: CT CHEST WITHOUT CONTRAST LOW-DOSE FOR LUNG CANCER SCREENING TECHNIQUE: Multidetector CT imaging of the chest was performed following the standard protocol without IV contrast. COMPARISON:  09/04/2019 screening chest CT. FINDINGS: Cardiovascular: Normal heart size. Stable trace pericardial effusion/thickening. Left anterior descending coronary atherosclerosis. Atherosclerotic nonaneurysmal thoracic aorta. Normal caliber pulmonary arteries. Mediastinum/Nodes: No discrete thyroid nodules. Unremarkable esophagus. No pathologically enlarged axillary, mediastinal or hilar lymph nodes, noting limited sensitivity for the detection of hilar adenopathy on this noncontrast study. Lungs/Pleura: No pneumothorax. No pleural effusion. Severe centrilobular emphysema with mild diffuse bronchial wall thickening. No acute consolidative airspace disease or lung masses. Chronic complete right middle lobe atelectasis. No significant pulmonary nodules. Upper abdomen: No acute abnormality. Musculoskeletal: No aggressive appearing focal osseous lesions. Mild thoracic spondylosis. IMPRESSION: 1. Lung-RADS 1, negative. Continue annual screening with low-dose chest CT without contrast in 12 months. 2. One vessel coronary atherosclerosis. 3. Aortic Atherosclerosis (ICD10-I70.0) and Emphysema (ICD10-J43.9). Electronically Signed   By: Ilona Sorrel M.D.   On: 09/07/2020 08:48       PFT Results Latest Ref Rng & Units 03/30/2018  FVC-Pre L 2.11  FVC-Predicted Pre % 100  FVC-Post L 2.31  FVC-Predicted Post % 109  Pre FEV1/FVC % % 41  Post FEV1/FCV % % 48  FEV1-Pre L 0.87  FEV1-Predicted Pre % 55  FEV1-Post L 1.10  DLCO uncorrected ml/min/mmHg 10.54  DLCO UNC% % 78  DLVA Predicted % 59  TLC L 8.24  TLC % Predicted % 213  RV % Predicted % 341    No results found for: NITRICOXIDE  Assessment & Plan:   COPD (chronic obstructive pulmonary disease) ([place]) Compensated on present regimen.  No changes Continue with yearly low-dose CT screening program  Plan  Patient Instructions  Continue on Breztri 2 puffs Twice daily  , rinse after use.  Activity as tolerated.  Continue on Oxygen 2l/m .  Albuterol inhaler or Duoneb As needed   Yearly CT scans as planned -Lung cancer screening program  Follow up with Dr. Valeta Harms in 6 months and As needed         Chronic respiratory failure with hypoxia ([place]) Continue on oxygen 2 L.  Protein-calorie malnutrition, severe High-protein diet and activity as tolerated     Rexene Edison, NP 09/29/2020

## 2020-09-29 NOTE — Assessment & Plan Note (Signed)
>>  ASSESSMENT AND PLAN FOR CHRONIC OBSTRUCTIVE PULMONARY DISEASE (HCC) WRITTEN ON 09/29/2020  2:54 PM BY PARRETT, TAMMY S, NP  Compensated on present regimen.  No changes Continue with yearly low-dose CT screening program  Plan  Patient Instructions  Continue on Breztri 2 puffs Twice daily  , rinse after use.  Activity as tolerated.  Continue on Oxygen 2l/m .  Albuterol inhaler or Duoneb As needed   Yearly CT scans as planned -Lung cancer screening program  Follow up with Dr. Tonia Brooms in 6 months and As needed

## 2020-10-02 NOTE — Progress Notes (Signed)
PCCM:  Thanks for seeing them.  [name], DO  Pulmonary Critical Care 10/02/2020 12:06 PM

## 2020-10-02 NOTE — Progress Notes (Signed)
PCCM:  Thanks for seeing her  [name], DO [place] [facility] 10/02/2020 12:17 PM

## 2020-10-23 ENCOUNTER — Telehealth: Payer: Self-pay | Admitting: Adult Health

## 2020-10-26 DIAGNOSIS — Z01811 Encounter for preprocedural respiratory examination: Secondary | ICD-10-CM | POA: Insufficient documentation

## 2020-10-26 NOTE — Telephone Encounter (Signed)
I am glad to do that , please call patient and ask what surgery she is having .  Nothing was mentioned at [facility]

## 2020-10-26 NOTE — Telephone Encounter (Signed)
Done

## 2020-10-26 NOTE — Assessment & Plan Note (Signed)
Late Add : 10/26/20   Request from surgical center patient for upcoming cataract surgery on right -needs preop pulmonary risk assessment.   Remains active at home , does shopping , drives, housework. She is widowed. She lives with brother.  No increased Oxygen demands. O2 sats adequate on 2l/m .  Remains on BREZTRI . No flare in sx.   Age and Co-morbidities place her at mild to moderate risk . Nothing to exclude from moving forward .   Major Pulmonary risks identified in the multifactorial risk analysis are but not limited to a) pneumonia; b) recurrent intubation risk; c) prolonged or recurrent acute respiratory failure needing mechanical ventilation; d) prolonged hospitalization; e) DVT/Pulmonary embolism; f) Acute Pulmonary edema  Recommend 1. Short duration of surgery as much as possible and avoid paralytic if possible   Aggressive pulmonary toilet with o2, bronchodilatation, and incentive spirometry and early ambulation

## 2020-10-26 NOTE — Telephone Encounter (Signed)
Yes I will sign. 

## 2020-10-26 NOTE — Telephone Encounter (Signed)
Note printed and faxed to the number given

## 2020-10-26 NOTE — Telephone Encounter (Signed)
Thank you Tammy, nothing needs to be signed just add risk assessment on your last note and let us know once that is done and we will fax them the note.

## 2020-10-26 NOTE — Telephone Encounter (Signed)
Called and spoke with patient, she is having cataract surgery on her right eye.  Advised her that Tammy would be glad to do the risk assessment for the procedure and we would fax the information to her surgeon.  She verbalized understanding.    Tammy, She is having cataract surgery on her right eye.  Please advise when the risk assessment is done and we will fax it to the [place] surgical center.  Thank you.

## 2020-10-30 ENCOUNTER — Telehealth: Payer: Self-pay | Admitting: Internal Medicine

## 2020-10-30 NOTE — Telephone Encounter (Signed)
Called patient, LVM advising of message from Dr.Hilty's previous note:  She had previously taken atorvastatin but had issues with liver enzyme elevation.  Rosuvastatin is not cause that issue but it could be causing her pain.  We talked about a 2-week statin holiday to see if this resolves.  I advised patient I would send her message over to Dr.Hilty to advise and give further recommendations, but if leg pain was not better it was probably not related to medication but we would check in for other advise.  Left call back number.

## 2020-10-30 NOTE — Telephone Encounter (Signed)
Patient states she has been off of her rosuvastatin and she is still having some soreness in her legs. She is not sure what to do next. Please assist

## 2020-11-02 NOTE — Telephone Encounter (Signed)
Generally, if the leg pain has not resolved off of the statin, there is another cause - would recommend restarting the statin - d/w PCP about other causes. If the leg pain gets worse on the statin, let me know.  Dr Rennis Golden

## 2020-11-02 NOTE — Telephone Encounter (Signed)
Called patient, LVM to call back to get advice from Dr.Hilty.  Left call back number.

## 2020-11-04 NOTE — Telephone Encounter (Signed)
Attempted to reach patient - left message to call back

## 2020-11-05 ENCOUNTER — Telehealth: Payer: Self-pay | Admitting: Internal Medicine

## 2020-11-05 NOTE — Telephone Encounter (Signed)
Patient was calling to see if Dr. Rennis Golden want her to start back taking rosuvastatin (CRESTOR) 20 MG tablet. Since you has seen the foot Dr. Please advise

## 2020-11-05 NOTE — Telephone Encounter (Signed)
Returned call to patient in re: 10/30/20 visit  Hilty, Lisette Abu, MD to Vickie Lamer, LPN       16:96 AM Note Generally, if the leg pain has not resolved off of the statin, there is another cause - would recommend restarting the statin - d/w PCP about other causes. If the leg pain gets worse on the statin, let me know.   Dr Rennis Golden     She reports she has a visit with PCP next week or may schedule a visit with foot doctor. Advised OK to not resume statin until after this additional evaluation. Explained that if pain did not resolve OFF medication, likely due to another cause but OK to wait and call us back after she has seen another provide for leg pain eval.

## 2020-11-11 ENCOUNTER — Telehealth: Payer: Self-pay

## 2020-11-11 NOTE — Telephone Encounter (Signed)
Phone call placed by Palliative care Volunteer. Report received that patient is doing fine.

## 2020-11-18 ENCOUNTER — Other Ambulatory Visit: Payer: Self-pay

## 2020-11-18 ENCOUNTER — Ambulatory Visit (INDEPENDENT_AMBULATORY_CARE_PROVIDER_SITE_OTHER): Payer: Medicare Other | Admitting: Podiatry

## 2020-11-18 ENCOUNTER — Encounter: Payer: Self-pay | Admitting: Podiatry

## 2020-11-18 ENCOUNTER — Ambulatory Visit (INDEPENDENT_AMBULATORY_CARE_PROVIDER_SITE_OTHER): Payer: Medicare Other

## 2020-11-18 DIAGNOSIS — M79674 Pain in right toe(s): Secondary | ICD-10-CM

## 2020-11-18 DIAGNOSIS — M79675 Pain in left toe(s): Secondary | ICD-10-CM

## 2020-11-18 DIAGNOSIS — M21611 Bunion of right foot: Secondary | ICD-10-CM

## 2020-11-18 DIAGNOSIS — G629 Polyneuropathy, unspecified: Secondary | ICD-10-CM | POA: Diagnosis not present

## 2020-11-18 DIAGNOSIS — M778 Other enthesopathies, not elsewhere classified: Secondary | ICD-10-CM

## 2020-11-18 DIAGNOSIS — B351 Tinea unguium: Secondary | ICD-10-CM | POA: Diagnosis not present

## 2020-11-18 DIAGNOSIS — M21619 Bunion of unspecified foot: Secondary | ICD-10-CM

## 2020-11-18 DIAGNOSIS — M21612 Bunion of left foot: Secondary | ICD-10-CM

## 2020-11-18 NOTE — Progress Notes (Signed)
Subjective:   Patient ID: Vickie Bowman, female   DOB: 70 y.o.   MRN: [id number]   HPI Patient presents with painful bunion with mild fluid around the first MPJ right over left with thickened yellow brittle nailbeds 1-5 both feet that are incurvated in the corners.  Also complains of tingling and numbness at nighttime.  Patient has stopped smoking but does have very bad COPD and is on oxygen 24 hours a day   Review of Systems  All other systems reviewed and are negative.      Objective:  Physical Exam Vitals and nursing note reviewed.  Constitutional:      Appearance: She is well-developed.  Pulmonary:     Effort: Pulmonary effort is normal.  Musculoskeletal:        General: Normal range of motion.  Skin:    General: Skin is warm.  Neurological:     Mental Status: She is alert.    Neurovascular status actually found to be intact with significant structural bunion deformity redness and swelling around the first MPJ right over left deviation of the hallux against the second toe with moderate rigid contracture of the second digit noted right over left.  Patient has good digital perfusion is well oriented and has thick yellow brittle nailbeds 1-5 both feet thick crural and are painful with shoe gear     Assessment:  Inflammatory capsulitis of the first MPJ right with structural bunion significant along with digital deformity second right and mycotic nail infection with pain 1-5 both feet      Plan:  H&P all conditions reviewed sterile prep and injected around the first MPJ right 3 mg Dexasone Kenalog 5 mg Xylocaine advised on wider shoes softer materials discussed possible bunion correction in future but due to poor health I would like to avoid this if we could and I debrided nailbeds 1-5 both feet no iatrogenic bleeding will be done on routine basis  X-rays indicate significant structural bunion deformity right over left with deviation of the hallux against the second toe  bilateral

## 2020-12-02 ENCOUNTER — Telehealth: Payer: Self-pay | Admitting: Pulmonary Disease

## 2020-12-02 NOTE — Telephone Encounter (Signed)
ATC left voicemail to return call to office  

## 2020-12-02 NOTE — Telephone Encounter (Signed)
Called and spoke with pt who states she has been doing some research and from the research she has been doing she found out that it said that CBD gummies are good for COPD as she said that it would help keep her airways open.  Pt wanted to know from Dr. Tonia Brooms what he thought about this.   Dr. Tonia Brooms, please advise.   Pt said if we call her back and cannot get her that it is okay for [place] to speak with her daughter Misty Stanley at phone number [phone].

## 2020-12-02 NOTE — Telephone Encounter (Signed)
Icard, Vickie Bo, DO  Vickie Bowman, Vickie Bowman, CMA; P [facility] Triage Pool Caller: Unspecified (Today, 11:02 AM)  She needs to be aware that there are other side-effects of cbd gummies and other drug interactions. I am not sure what effect it will have on her breathing   Vickie Igo, DO  [facility]  12/02/2020 4:37 PM     Called and spoke with pt letting her know  the info stated by Dr. Tonia Brooms and she verbalized understanding. Nothing further needed.

## 2020-12-02 NOTE — Telephone Encounter (Signed)
Pt is returning phone call in regards to CBD gummies. Pls regard; [phone].

## 2020-12-29 ENCOUNTER — Telehealth: Payer: Self-pay | Admitting: Pulmonary Disease

## 2020-12-29 NOTE — Telephone Encounter (Signed)
Patient wanted you to be aware that she would like to try CBD oil because she believes there is a difference in the gummies and oil.  Dr. Tonia Brooms please advise  Thank you

## 2020-12-29 NOTE — Telephone Encounter (Signed)
Pt is wanting to know if Dr. Tonia Brooms is aware of her trying CBD oil she was made aware last month of the reqs by Dr. Tonia Brooms for the CBD gummies but she said she was wanting to know if she can try the oil because she believes there is a difference in the two. Pls regard; [phone].

## 2020-12-30 MED ORDER — BREZTRI AEROSPHERE 160-9-4.8 MCG/ACT IN AERO
2.0000 | INHALATION_SPRAY | Freq: Two times a day (BID) | RESPIRATORY_TRACT | 5 refills | Status: DC
Start: 2020-12-30 — End: 2021-06-29

## 2020-12-30 NOTE — Telephone Encounter (Signed)
I have called the pt and she is aware of BI recs.  She stated that she thinks that she will not try this.  Pt is aware and nothing further is needed.

## 2021-02-09 ENCOUNTER — Telehealth: Payer: Self-pay | Admitting: Pulmonary Disease

## 2021-02-09 DIAGNOSIS — J449 Chronic obstructive pulmonary disease, unspecified: Secondary | ICD-10-CM

## 2021-02-09 NOTE — Telephone Encounter (Signed)
Called and spoke with Patient. Patient stated she is wanting a dietician referral for a COPD diet plan and recommendations.  Message routed to Dr. Tonia Brooms to advise

## 2021-02-10 NOTE — Telephone Encounter (Signed)
Called and spoke with patient and her daughter to let them know that Dr. Tonia Brooms is ok with placing referral to dietician. Expressed understanding. Nothing further needed at this time.

## 2021-02-22 ENCOUNTER — Telehealth: Payer: Self-pay | Admitting: Dietician

## 2021-02-22 ENCOUNTER — Ambulatory Visit: Payer: Medicare Other | Admitting: Podiatry

## 2021-02-22 NOTE — Telephone Encounter (Signed)
Returned patient call. Patient states that she has an appointment on December 2 and wonders what she can eat before then.  Reviewed chart. Ht. 5'8" Wt. 135 lbs Hgb WNL Albumin Low Patient reports that weight is stable. She states that she eats healthy.  Briefly discussed need to maintain proper nutrition. Patient states that she feels that she eats well and does not think she needs her appointment.  December 2 appointment with RD cancelled. Mailed patient "Nutrition Therapy for COPD" from [facility] with card.  Patient to call for any questions.  Oran Rein, RD, LDN, CDCES

## 2021-02-24 ENCOUNTER — Ambulatory Visit: Payer: Medicare Other | Admitting: Podiatry

## 2021-03-02 ENCOUNTER — Encounter: Payer: Self-pay | Admitting: Podiatry

## 2021-03-02 ENCOUNTER — Ambulatory Visit (INDEPENDENT_AMBULATORY_CARE_PROVIDER_SITE_OTHER): Payer: Medicare Other | Admitting: Podiatry

## 2021-03-02 ENCOUNTER — Other Ambulatory Visit: Payer: Self-pay

## 2021-03-02 DIAGNOSIS — R200 Anesthesia of skin: Secondary | ICD-10-CM

## 2021-03-02 DIAGNOSIS — M79675 Pain in left toe(s): Secondary | ICD-10-CM | POA: Diagnosis not present

## 2021-03-02 DIAGNOSIS — M79674 Pain in right toe(s): Secondary | ICD-10-CM

## 2021-03-02 DIAGNOSIS — B351 Tinea unguium: Secondary | ICD-10-CM | POA: Diagnosis not present

## 2021-03-02 DIAGNOSIS — R202 Paresthesia of skin: Secondary | ICD-10-CM | POA: Diagnosis not present

## 2021-03-02 MED ORDER — CICLOPIROX 8 % EX SOLN: CUTANEOUS | 11 refills | Status: DC

## 2021-03-02 NOTE — Patient Instructions (Signed)
Ciclopirox nail solution What is this medication? CICLOPIROX (sye kloe PEER ox) NAIL SOLUTION is an antifungal medicine. It used to treat fungal infections of the nails. This medicine may be used for other purposes; ask your health care provider or pharmacist if you have questions. COMMON BRAND NAME(S): Ciclodan, CNL8, Penlac What should I tell my care team before I take this medication? They need to know if you have any of these conditions: diabetes mellitus history of seizures HIV infection immune system problems or organ transplant large areas of burned or damaged skin peripheral vascular disease or poor circulation taking corticosteroid medication (including steroid inhalers, cream, or lotion) an unusual or allergic reaction to ciclopirox, isopropyl alcohol, other medicines, foods, dyes, or preservatives pregnant or trying to get pregnant breast-feeding How should I use this medication? This medicine is for external use only. Follow the directions that come with this medicine exactly. Wash and dry your hands before use. Avoid contact with the eyes, mouth or nose. If you do get this medicine in your eyes, rinse out with plenty of cool tap water. Contact your doctor or health care professional if eye irritation occurs. Use at regular intervals. Do not use your medicine more often than directed. Finish the full course prescribed by your doctor or health care professional even if you think you are better. Do not stop using except on your doctor's advice. Talk to your pediatrician regarding the use of this medicine in children. While this medicine may be prescribed for children as young as 12 years for selected conditions, precautions do apply. Overdosage: If you think you have taken too much of this medicine contact a poison control center or emergency room at once. NOTE: This medicine is only for you. Do not share this medicine with others. What if I miss a dose? If you miss a dose, use it as  soon as you can. If it is almost time for your next dose, use only that dose. Do not use double or extra doses. What may interact with this medication? Interactions are not expected. Do not use any other skin products without telling your doctor or health care professional. This list may not describe all possible interactions. Give your health care provider a list of all the medicines, herbs, non-prescription drugs, or dietary supplements you use. Also tell them if you smoke, drink alcohol, or use illegal drugs. Some items may interact with your medicine. What should I watch for while using this medication? Tell your doctor or health care professional if your symptoms get worse. Four to six months of treatment may be needed for the nail(s) to improve. Some people may not achieve a complete cure or clearing of the nails by this time. Tell your doctor or health care professional if you develop sores or blisters that do not heal properly. If your nail infection returns after stopping using this product, contact your doctor or health care professional. What side effects may I notice from receiving this medication? Side effects that you should report to your doctor or health care professional as soon as possible: allergic reactions like skin rash, itching or hives, swelling of the face, lips, or tongue severe irritation, redness, burning, blistering, peeling, swelling, oozing Side effects that usually do not require medical attention (report to your doctor or health care professional if they continue or are bothersome): mild reddening of the skin nail discoloration temporary burning or mild stinging at the site of application This list may not describe all possible side effects.  Call your doctor for medical advice about side effects. You may report side effects to FDA at 1-800-FDA-1088. Where should I keep my medication? Keep out of the reach of children. Store at room temperature between 15 and 30  degrees C (59 and 86 degrees F). Do not freeze. Protect from light by storing the bottle in the carton after every use. This medicine is flammable. Keep away from heat and flame. Throw away any unused medicine after the expiration date. NOTE: This sheet is a summary. It may not cover all possible information. If you have questions about this medicine, talk to your doctor, pharmacist, or health care provider.  2022 Elsevier/Gold Standard (2007-08-13 16:49:20)  Peripheral Neuropathy Peripheral neuropathy is a type of nerve damage. It affects nerves that carry signals between the spinal cord and the arms, legs, and the rest of the body (peripheral nerves). It does not affect nerves in the spinal cord or brain. In peripheral neuropathy, one nerve or a group of nerves may be damaged. Peripheral neuropathy is a broad category that includes many specific nerve disorders, like diabetic neuropathy, hereditary neuropathy, and carpal tunnel syndrome. What are the causes? This condition may be caused by: Diabetes. This is the most common cause of peripheral neuropathy. Nerve injury. Pressure or stress on a nerve that lasts a long time. Lack (deficiency) of B vitamins. This can result from alcoholism, poor diet, or a restricted diet. Infections. Autoimmune diseases, such as rheumatoid arthritis and systemic lupus erythematosus. Nerve diseases that are passed from parent to child (inherited). Some medicines, such as cancer medicines (chemotherapy). Poisonous (toxic) substances, such as lead and mercury. Too little blood flowing to the legs. Kidney disease. Thyroid disease. In some cases, the cause of this condition is not known. What are the signs or symptoms? Symptoms of this condition depend on which of your nerves is damaged. Common symptoms include: Loss of feeling (numbness) in the feet, hands, or both. Tingling in the feet, hands, or both. Burning pain. Very sensitive skin. Weakness. Not being  able to move a part of the body (paralysis). Muscle twitching. Clumsiness or poor coordination. Loss of balance. Not being able to control your bladder. Feeling dizzy. Sexual problems. How is this diagnosed? Diagnosing and finding the cause of peripheral neuropathy can be difficult. Your health care provider will take your medical history and do a physical exam. A neurological exam will also be done. This involves checking things that are affected by your brain, spinal cord, and nerves (nervous system). For example, your health care provider will check your reflexes, how you move, and what you can feel. You may have other tests, such as: Blood tests. Electromyogram (EMG) and nerve conduction tests. These tests check nerve function and how well the nerves are controlling the muscles. Imaging tests, such as CT scans or MRI to rule out other causes of your symptoms. Removing a small piece of nerve to be examined in a lab (nerve biopsy). Removing and examining a small amount of the fluid that surrounds the brain and spinal cord (lumbar puncture). How is this treated? Treatment for this condition may involve: Treating the underlying cause of the neuropathy, such as diabetes, kidney disease, or vitamin deficiencies. Stopping medicines that can cause neuropathy, such as chemotherapy. Medicine to help relieve pain. Medicines may include: Prescription or over-the-counter pain medicine. Antiseizure medicine. Antidepressants. Pain-relieving patches that are applied to painful areas of skin. Surgery to relieve pressure on a nerve or to destroy a nerve that is causing  pain. Physical therapy to help improve movement and balance. Devices to help you move around (assistive devices). Follow these instructions at home: Medicines Take over-the-counter and prescription medicines only as told by your health care provider. Do not take any other medicines without first asking your health care provider. Do not  drive or use heavy machinery while taking prescription pain medicine. Lifestyle  Do not use any products that contain nicotine or tobacco, such as cigarettes and e-cigarettes. Smoking keeps blood from reaching damaged nerves. If you need help quitting, ask your health care provider. Avoid or limit alcohol. Too much alcohol can cause a vitamin B deficiency, and vitamin B is needed for healthy nerves. Eat a healthy diet. This includes: Eating foods that are high in fiber, such as fresh fruits and vegetables, whole grains, and beans. Limiting foods that are high in fat and processed sugars, such as fried or sweet foods. General instructions  If you have diabetes, work closely with your health care provider to keep your blood sugar under control. If you have numbness in your feet: Check every day for signs of injury or infection. Watch for redness, warmth, and swelling. Wear padded socks and comfortable shoes. These help protect your feet. Develop a good support system. Living with peripheral neuropathy can be stressful. Consider talking with a mental health specialist or joining a support group. Use assistive devices and attend physical therapy as told by your health care provider. This may include using a walker or a cane. Keep all follow-up visits as told by your health care provider. This is important. Contact a health care provider if: You have new signs or symptoms of peripheral neuropathy. You are struggling emotionally from dealing with peripheral neuropathy. Your pain is not well-controlled. Get help right away if: You have an injury or infection that is not healing normally. You develop new weakness in an arm or leg. You have fallen or do so frequently. Summary Peripheral neuropathy is when the nerves in the arms, or legs are damaged, resulting in numbness, weakness, or pain. There are many causes of peripheral neuropathy, including diabetes, pinched nerves, vitamin deficiencies,  autoimmune disease, and hereditary conditions. Diagnosing and finding the cause of peripheral neuropathy can be difficult. Your health care provider will take your medical history, do a physical exam, and do tests, including blood tests and nerve function tests. Treatment involves treating the underlying cause of the neuropathy and taking medicines to help control pain. Physical therapy and assistive devices may also help. This information is not intended to replace advice given to you by your health care provider. Make sure you discuss any questions you have with your health care provider. Document Revised: 02/18/2020 Document Reviewed: 02/18/2020 Elsevier Patient Education  2022 [employer].

## 2021-03-06 NOTE — Progress Notes (Signed)
  Subjective:  Patient ID: Vickie Bowman, female    DOB: 1950-05-28,  MRN: [id number]  Vickie Bowman presents to clinic today for painful thick toenails that are difficult to trim. Pain interferes with ambulation. Aggravating factors include wearing enclosed shoe gear. Pain is relieved with periodic professional debridement.    Patient states she experiences numbness and tingling which radiates up her legs. This has been going on for some time. 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   PCP is Little, Fayrene Fearing, MD.  Allergies  Allergen Reactions   Penicillins Swelling    Has patient had a PCN reaction causing immediate rash, facial/tongue/throat swelling, SOB or lightheadedness with hypotension: Yes Has patient had a PCN reaction causing severe rash involving mucus membranes or skin necrosis: No Has patient had a PCN reaction that required hospitalization: No Has patient had a PCN reaction occurring within the last 10 years: No If all of the above answers are "NO", then may proceed with Cephalosporin use.     Review of Systems: Negative except as noted in the HPI. Objective:   Constitutional Vickie Bowman is a pleasant 70 y.o. Caucasian female, in NAD. AAO x 3.   Vascular Capillary fill time to digits <3 seconds b/l lower extremities. Palpable DP pulse(s) b/l lower extremities Palpable PT pulse(s) b/l lower extremities Pedal hair sparse. Lower extremity skin temperature gradient within normal limits. No pain with calf compression b/l. No cyanosis or clubbing noted.  Neurologic Normal speech. Oriented to person, place, and time. Pt has subjective symptoms of neuropathy. Protective sensation intact 5/5 intact bilaterally with 10g monofilament b/l.  Dermatologic Skin warm and supple b/l lower extremities. No open wounds b/l LE. No interdigital macerations b/l lower extremities. Toenails 1-5 b/l elongated, discolored, dystrophic, thickened, crumbly with subungual debris  and tenderness to dorsal palpation.  Orthopedic: Normal muscle strength 5/5 to all lower extremity muscle groups bilaterally. Hallux valgus with bunion deformity noted b/l lower extremities. Hammertoe(s) noted to the R 2nd toe.   Radiographs: None Assessment:   1. Onychomycosis   2. Numbness and tingling of both lower extremities   3. Pain in toes of both feet    Plan:  Patient was evaluated and treated and all questions answered. Consent given for treatment as described below: -Examined patient. -Discussed neuropathy symptoms. She has known lumbar radiculopathy. Refer to Neurology to assist with neuropathy symptoms of lower extremity. -Patient to continue soft, supportive shoe gear daily. -Discussed topical, laser and oral medication. Patient opted for topical treatment. Rx sent to pharmacy for 8% Ciclopirox Solution.  Apply one coat to each toenail once daily for 48 weeks. Remove once weekly with nail polish remover. -Toenails 1-5 b/l were debrided in length and girth with sterile nail nippers and dremel without iatrogenic bleeding.  -Patient to report any pedal injuries to medical professional immediately. -Patient/POA to call should there be question/concern in the interim.  Return in about 3 months (around 06/02/2021).  Freddie Breech, DPM

## 2021-03-19 ENCOUNTER — Other Ambulatory Visit: Payer: Self-pay

## 2021-03-19 ENCOUNTER — Encounter: Payer: Self-pay | Admitting: Pulmonary Disease

## 2021-03-19 ENCOUNTER — Ambulatory Visit (INDEPENDENT_AMBULATORY_CARE_PROVIDER_SITE_OTHER): Payer: Medicare Other | Admitting: Pulmonary Disease

## 2021-03-19 VITALS — BP 104/62 | HR 79 | Temp 98.4°F | Ht 68.0 in | Wt 133.2 lb

## 2021-03-19 DIAGNOSIS — E43 Unspecified severe protein-calorie malnutrition: Secondary | ICD-10-CM | POA: Diagnosis not present

## 2021-03-19 DIAGNOSIS — J9611 Chronic respiratory failure with hypoxia: Secondary | ICD-10-CM | POA: Diagnosis not present

## 2021-03-19 DIAGNOSIS — F17201 Nicotine dependence, unspecified, in remission: Secondary | ICD-10-CM

## 2021-03-19 DIAGNOSIS — J449 Chronic obstructive pulmonary disease, unspecified: Secondary | ICD-10-CM

## 2021-03-19 DIAGNOSIS — J479 Bronchiectasis, uncomplicated: Secondary | ICD-10-CM

## 2021-03-19 DIAGNOSIS — Z23 Encounter for immunization: Secondary | ICD-10-CM | POA: Diagnosis not present

## 2021-03-19 NOTE — Progress Notes (Signed)
Synopsis: Referred in November 2019 for COPD by Tamsen Roers, MD  Subjective:   PATIENT ID: Vickie Bowman GENDER: female DOB: [birth year], MRN: [id number]  Chief Complaint  Patient presents with   Follow-up    Pt states she has been doing okay since last visit. States her breathing may be a little worse compared to last visit.    PMH of CHF, tobacco abuse, quit smoking Aug 2019. Smoked since age 70 years.  Patient was admitted to the hospital in September 2019 as well as in October 2019 for COPD exacerbations.  The patient's most recent hospitalization for COPD she developed hypoxemic respiratory failure requiring nasal cannula supplementation.  Initial echocardiogram revealed a reduced ejection fraction and she was subsequently taken for a left heart cath in September 2019 which revealed nonobstructive CAD.  The patient was treated in the emergency room with CPAP, steroids, magnesium, albuterol.  Hospital course was relatively uncomplicated and the patient was discharged home on oxygen therapy.  Since her hospitalization she has been overall been doing well.  She does have significant dyspnea on exertion.  She feels like she has slowly been recovering.  She does acknowledge that she has been able to quit smoking.  She is currently on 3 L pulse from a POC.  She states that she has been trying to eat.  She does know that her BMI is low.  Patient denies chest pain, hemoptysis, daily sputum production.  OV 06/06/2018: She feels like she is a little congested. This has been getting worse throughout the past week. Cough is much worse. Not really bringing anything up at this time. She trys but it feels stuck.  Patient denies any fevers.  She has woke up in the middle the night with ongoing cough for the past several nights.  She does get up in the middle of the night and feel like her chest is more tight.  She has been using her albuterol more over the past couple of days.  Does not have any significant  sputum production.  OV 11/29/2018: Here today for follow-up for management of severe COPD.  She has oxygen dependent respiratory failure on oxygen concentrator today in the office at 3 L.  Patient currently managed well on Symbicort and Spiriva Respimat.  She likes her inhaler regimen.  She does notice that has made her breathe better.  Today in the office we discussed her goals of care.  She states that she remembers distinctly seeing her mother received cardiopulmonary resuscitation.  She would not ever want this to occur to her.  She understands that at some point she may get to the point where she is unable to breathe.  And would not want to be placed on mechanical life support.  We discussed all of this to include risk, benefits and alternatives in detail today in the office.  She is elected to proceed with filling out durable DNR forms today in the office.  As for her ongoing respiratory symptoms she wants to continue the use of her inhalers as long as possible.  We discussed the alternatives of using nebulized therapy as well.  She states that they also has trouble with her current POC machine.  She discussed this with her provider DME provider.  They need a new prescription to help with replacing the old one.  OV 06/26/2019: Patient seen today for follow-up regarding advanced COPD.  Last office visit she completed durable DNR forms in [place] form.  At this  point she is doing well on Symbicort plus Spiriva.  She is very anxious about ever having to go back in the hospital she would like to avoid this at all cost.  Otherwise from a respiratory standpoint she does have shortness of breath with exertion.  But she is still using her oxygen and trying to stay active as much as possible.  Her weight has been stable.  She denies night sweats fevers weight loss chills.  Patient denies hemoptysis  OV 10/02/2019: Patient is here today for COPD follow up.  Patient was started on breztri inhaler.  At this  time doing well.  She still thinks that she is breathing much better than where she was before.  She can see a significant difference after changing inhaler regimen.  She feels like she can breathe better in the evening time in comparison to her other meds.  She is able to take a bath without feeling as dyspneic as she was in the past.  Denies hemoptysis.  Is denies any significant cough or sputum production.  OV 03/26/2020: Patient here for COPD follow-up.  She was on breztri inhaler as treated previously.  She does feel like she has shortness of breath with exertion.  She does feel stable at this time with her current regimen but she is interested in potentially switching to Trelegy inhaler.  She has had some friends that have used and have done really well with it.  She would like to give it a shot to see if makes any difference in her respiratory symptoms.  Currently stable on 2 L pulse with a POC today in the office.  She has a planned noncontrasted CT chest lung cancer screening for April 2021.  OV 03/19/2021: doing well today. Copd stable. Able to complete ADLs.  Respiratory wise still has some dyspnea on exertion.  This is at her baseline.  No recent exacerbations.    Past Medical History:  Diagnosis Date   Anxiety    CHF (congestive heart failure) (HCC)    COPD (chronic obstructive pulmonary disease) (HCC)    Lumbar herniated disc    Tobacco abuse      Family History  Problem Relation Age of Onset   Liver cancer Mother    Breast cancer Sister    CAD Neg Hx      Past Surgical History:  Procedure Laterality Date   LEFT HEART CATH AND CORONARY ANGIOGRAPHY N/A 02/05/2018   Procedure: LEFT HEART CATH AND CORONARY ANGIOGRAPHY;  Surgeon: Nelva Bush, MD;  Location: [facility];  Service: Cardiovascular;  Laterality: N/A;    Social History   Socioeconomic History   Marital status: Widowed    Spouse name: Not on file   Number of children: Not on file   Years of  education: Not on file   Highest education level: Not on file  Occupational History   Not on file  Tobacco Use   Smoking status: Former    Packs/day: 1.50    Years: 53.00    Pack years: 79.50    Types: Cigarettes    Start date: 05/24/1967    Quit date: 01/07/2018    Years since quitting: 3.1   Smokeless tobacco: Never   Tobacco comments:    Smoked since age 89, quit 12/2017  Vaping Use   Vaping Use: Never used  Substance and Sexual Activity   Alcohol use: Yes    Comment: Rare, glass of wine no more than every few weeks  Drug use: Never   Sexual activity: Not Currently  Other Topics Concern   Not on file  Social History Narrative   Not on file   Social Determinants of Health   Financial Resource Strain: Not on file  Food Insecurity: Not on file  Transportation Needs: Not on file  Physical Activity: Not on file  Stress: Not on file  Social Connections: Not on file  Intimate Partner Violence: Not on file     Allergies  Allergen Reactions   Penicillins Swelling    Has patient had a PCN reaction causing immediate rash, facial/tongue/throat swelling, SOB or lightheadedness with hypotension: Yes Has patient had a PCN reaction causing severe rash involving mucus membranes or skin necrosis: No Has patient had a PCN reaction that required hospitalization: No Has patient had a PCN reaction occurring within the last 10 years: No If all of the above answers are "NO", then may proceed with Cephalosporin use.      Outpatient Medications Prior to Visit  Medication Sig Dispense Refill   ALPRAZolam (XANAX) 1 MG tablet Take 1 mg by mouth 2 (two) times daily.     aspirin EC 81 MG EC tablet Take 1 tablet (81 mg total) by mouth daily. 30 tablet 0   BREZTRI AEROSPHERE 160-9-4.8 MCG/ACT AERO Inhale 2 puffs into the lungs in the morning and at bedtime. 10.7 g 5   Cholecalciferol (VITAMIN D) 2000 units CAPS Take 1 capsule (2,000 Units total) by mouth daily. 30 capsule 5   ciclopirox  (PENLAC) 8 % solution Apply one coat to toenail qd x 48 weeks.  Remove weekly with polish remover. 6.6 mL 11   HYDROcodone-acetaminophen (NORCO/VICODIN) 5-325 MG tablet Take 1 tablet by mouth 2 (two) times daily as needed for pain.  0   ipratropium-albuterol (DUONEB) 0.5-2.5 (3) MG/3ML SOLN Take 3 mLs by nebulization every 6 (six) hours as needed. 90 mL 3   meloxicam (MOBIC) 7.5 MG tablet Take 7.5 mg by mouth daily.     metoprolol succinate (TOPROL-XL) 25 MG 24 hr tablet Take 0.5 tablets (12.5 mg total) by mouth daily. 60 tablet 0   PROAIR HFA 108 (90 Base) MCG/ACT inhaler Inhale 2 puffs into the lungs 4 (four) times daily as needed for wheezing. 1 each 5   Spacer/Aero-Holding Chambers (AEROCHAMBER MV) inhaler Use as instructed 1 each 0   fluticasone (FLONASE) 50 MCG/ACT nasal spray Place 1 spray into both nostrils daily. (Patient not taking: Reported on 09/29/2020) 16 g 0   moxifloxacin (VIGAMOX) 0.5 % ophthalmic solution Place into the right eye.     prednisoLONE acetate (PRED FORTE) 1 % ophthalmic suspension Place 1 drop into the right eye 4 (four) times daily.     rosuvastatin (CRESTOR) 20 MG tablet Take 1 tablet by mouth once daily (Patient not taking: Reported on 09/29/2020) 90 tablet 0   SYMBICORT 160-4.5 MCG/ACT inhaler Inhale into the lungs.     terbinafine (LAMISIL) 250 MG tablet Take 250 mg by mouth daily.     No facility-administered medications prior to visit.    Review of Systems  Constitutional:  Positive for malaise/fatigue. Negative for chills, fever and weight loss.  HENT:  Negative for hearing loss, sore throat and tinnitus.   Eyes:  Negative for blurred vision and double vision.  Respiratory:  Positive for shortness of breath. Negative for cough, hemoptysis, sputum production, wheezing and stridor.   Cardiovascular:  Negative for chest pain, palpitations, orthopnea, leg swelling and PND.  Gastrointestinal:  Negative for  abdominal pain, constipation, diarrhea, heartburn,  nausea and vomiting.  Genitourinary:  Negative for dysuria, hematuria and urgency.  Musculoskeletal:  Negative for joint pain and myalgias.  Skin:  Negative for itching and rash.  Neurological:  Negative for dizziness, tingling, weakness and headaches.  Endo/Heme/Allergies:  Negative for environmental allergies. Does not bruise/bleed easily.  Psychiatric/Behavioral:  Negative for depression. The patient is not nervous/anxious and does not have insomnia.   All other systems reviewed and are negative.   Objective:  Physical Exam Vitals reviewed.  Constitutional:      General: She is not in acute distress.    Appearance: She is well-developed.  HENT:     Head: Normocephalic and atraumatic.     Mouth/Throat:     Pharynx: No oropharyngeal exudate.  Eyes:     Conjunctiva/sclera: Conjunctivae normal.     Pupils: Pupils are equal, round, and reactive to light.  Neck:     Vascular: No JVD.     Trachea: No tracheal deviation.     Comments: Loss of supraclavicular fat Cardiovascular:     Rate and Rhythm: Normal rate and regular rhythm.     Heart sounds: S1 normal and S2 normal.     Comments: Distant heart tones Pulmonary:     Effort: No tachypnea or accessory muscle usage.     Breath sounds: No stridor. Decreased breath sounds (throughout all lung fields) present. No wheezing, rhonchi or rales.     Comments: Diminished breath sounds bilaterally  Abdominal:     General: Bowel sounds are normal. There is no distension.     Palpations: Abdomen is soft.     Tenderness: There is no abdominal tenderness.  Musculoskeletal:        General: Deformity (muscle wasting ) present.  Skin:    General: Skin is warm and dry.     Capillary Refill: Capillary refill takes less than 2 seconds.     Findings: No rash.  Neurological:     Mental Status: She is alert and oriented to person, place, and time.  Psychiatric:        Behavior: Behavior normal.     Vitals:   03/19/21 1339  BP: 104/62   Pulse: 79  Temp: 98.4 F (36.9 C)  TempSrc: Oral  SpO2: 94%  Weight: 133 lb 3.2 oz (60.4 kg)  Height: _0  (1.727 m)   94% on 3 L pulse POC BMI Readings from Last 3 Encounters:  03/19/21 20.25 kg/m  09/29/20 20.53 kg/m  07/01/20 20.98 kg/m   Wt Readings from Last 3 Encounters:  03/19/21 133 lb 3.2 oz (60.4 kg)  09/29/20 135 lb (61.2 kg)  07/01/20 138 lb (62.6 kg)     CBC    Component Value Date/Time   WBC 5.7 07/01/2020 1430   WBC 5.7 03/05/2018 0327   RBC 4.56 07/01/2020 1430   RBC 4.61 03/05/2018 0327   HGB 13.6 07/01/2020 1430   HCT 41.2 07/01/2020 1430   PLT 192 07/01/2020 1430   MCV 90 07/01/2020 1430   MCH 29.8 07/01/2020 1430   MCH 30.4 03/05/2018 0327   MCHC 33.0 07/01/2020 1430   MCHC 31.5 03/05/2018 0327   RDW 13.1 07/01/2020 1430   LYMPHSABS 2.3 03/04/2018 2146   MONOABS 0.7 03/04/2018 2146   EOSABS 0.2 03/04/2018 2146   BASOSABS 0.1 03/04/2018 2146    Chest Imaging: 03/04/2018 chest x-ray: Emphysematous lung changes The patient's images have been independently reviewed by me.    Low-dose lung cancer screening  CT completed in March 2020: Lung RADS 1, considered negative recommending annual 68-monthfollow-up.  This is already been ordered. Additionally there was some findings of bronchiectasis with no lower lobe evidence of mucoid impaction possible underlying atypical infection such as MAC.  The patient's images have been independently reviewed by me.    09/04/2019: Lung cancer screening CT Lung RADS 1 - recommending 1 year follow-up evidence of emphysema and coronary artery disease. The patient's images have been independently reviewed by me.    April 2022 LDCT: No worrisome lesions. Lung rads 1  Repeat annual screening next year.  The patient's images have been independently reviewed by me.    Pulmonary Functions Testing Results: PFT Results Latest Ref Rng & Units 03/30/2018  FVC-Pre L 2.11  FVC-Predicted Pre % 100  FVC-Post L 2.31   FVC-Predicted Post % 109  Pre FEV1/FVC % % 41  Post FEV1/FCV % % 48  FEV1-Pre L 0.87  FEV1-Predicted Pre % 55  FEV1-Post L 1.10  DLCO uncorrected ml/min/mmHg 10.54  DLCO UNC% % 78  DLVA Predicted % 59  TLC L 8.24  TLC % Predicted % 213  RV % Predicted % 341   6MWT: SIX MIN WALK 06/06/2018  Medications Proair at 3pm, Aspirin 870mat 7:30, Vitamin D 2000 units, metoprolol 12.59m77mDulera 100, Spiriva 59m459mt 10am  Supplimental Oxygen during Test? (L/min) Yes  O2 Flow Rate 3  Type Continuous  Laps 9  Partial Lap (in Meters) 0  Baseline BP (sitting) 114/70  Baseline Heartrate 91  Baseline Dyspnea (Borg Scale) 3  Baseline Fatigue (Borg Scale) 3  Baseline SPO2 98  BP (sitting) 130/80  Heartrate 115  Dyspnea (Borg Scale) 9  Fatigue (Borg Scale) 4  SPO2 95  BP (sitting) 122/70  Heartrate 101  SPO2 98  Stopped or Paused before Six Minutes No  Distance Completed 306  Tech Comments: Pt walked at a normal pace without stopping during the walk. Pt denied any complaints.    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 Normal left ventricular filling pressure. Low normal left ventricular filling pressure with subtle apical hypokinesis (LVEF 50-55%).    Assessment & Plan:     ICD-10-CM   1. Chronic obstructive pulmonary disease, unspecified COPD type (HCC)[place]44.9     2. Chronic respiratory failure with hypoxia (HCC)  J96.11     3. Protein-calorie malnutrition, severe  E43     4. Chronic hypoxemic respiratory failure (HCC)  J96.11     5. Stage 2 moderate COPD by GOLD classification (HCC)[place]44.9     6. 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 Bronchiectasis without complication (HCC)[id number]   Discussion:  This is a 70 y43r old female, mMRC 2, stage II  COPD, chronic hypoxemic respiratory failure on 2 L nasal cannula, former smoker.  Currently enrolled in our lung cancer screening program.  We reviewed her lung cancer screening that was done in April 2022, lung RADS 1 with no concerning imaging findings.  Her BMI has been stable.  We discussed this in detail as well importance of maintaining.  She also needs her flu shot today.  We also counseled on getting her COVID-19 booster shot which she has not received yet.  Plan: Continue albuterol as needed Continue Breztri Continue vitamin D  supplementation Continue staying active as possible Continue enrollment in her lung cancer screening program, repeat lung cancer screening CT in April 2023.   Garner Nash, DO [facility] 03/19/2021 2:05 PM

## 2021-03-19 NOTE — Patient Instructions (Signed)
Thank you for visiting Dr. Tonia Brooms at [facility]. Today we recommend the following:  Continue breztri  Continue albuterol as needed  Continue yearly LDCT screening  Continue vitamin D supplement   Return in about 1 year (around 03/19/2022) for with APP or Dr. Tonia Brooms.    Please do your part to reduce the spread of COVID-19.

## 2021-03-25 ENCOUNTER — Ambulatory Visit: Payer: Medicare Other | Admitting: Dietician

## 2021-04-06 ENCOUNTER — Telehealth: Payer: Self-pay | Admitting: Pulmonary Disease

## 2021-04-06 NOTE — Telephone Encounter (Signed)
I have sent the OV notes that the pt has requested to be sent to Dr. Aida Puffer at [facility].

## 2021-04-20 ENCOUNTER — Other Ambulatory Visit: Payer: Self-pay

## 2021-04-20 ENCOUNTER — Ambulatory Visit (INDEPENDENT_AMBULATORY_CARE_PROVIDER_SITE_OTHER): Payer: Medicare Other | Admitting: Neurology

## 2021-04-20 ENCOUNTER — Encounter: Payer: Self-pay | Admitting: Neurology

## 2021-04-20 VITALS — BP 117/78 | HR 81 | Ht 68.0 in | Wt 134.5 lb

## 2021-04-20 DIAGNOSIS — G6289 Other specified polyneuropathies: Secondary | ICD-10-CM

## 2021-04-20 MED ORDER — DULOXETINE HCL 60 MG PO CPEP
60.0000 mg | ORAL_CAPSULE | Freq: Every day | ORAL | 0 refills | Status: DC
Start: 2021-04-20 — End: 2021-05-18

## 2021-04-20 NOTE — Patient Instructions (Signed)
Start with Duloxetine 60 mg daily  Continue your other medications  We will check Hemoglobin A1C, TSH and Vitamin B 12 levels today  Return in 6 months

## 2021-04-20 NOTE — Progress Notes (Signed)
[facility]  PATIENT: Vickie Bowman DOB: 1950/07/17  REQUESTING CLINICIAN: Freddie Breech, DPM HISTORY FROM: Patient  REASON FOR VISIT: Bilateral feet and hands numbness    HISTORICAL  CHIEF COMPLAINT:  Chief Complaint  Patient presents with   New Patient (Initial Visit)    Rm 13.  NP internal referral for Numbness and tingling of both lower extremities. Patient reports it has occurred for the past several months.    HISTORY OF PRESENT ILLNESS:  This is a 70 year old woman with past medical history of anxiety and panic attack, COPD on additional oxygen who is presenting with numbness in both feet and hands.  Patient stated numbness started in the past 52-month, she notices more when going to bed.  Denies any pain in the lower extremities, denies any back pain and denies any shooting pain.  She reported trying gabapentin in the past without benefit. Currently she is not on medication for numbness. Denies any history of diabetes.  Denies any falls.     OTHER MEDICAL CONDITIONS: Anxiety, COPD, Panic attack   REVIEW OF SYSTEMS: Full 14 system review of systems performed and negative with exception of: as noted in the HPI   ALLERGIES: Allergies  Allergen Reactions   Penicillins Swelling    Has patient had a PCN reaction causing immediate rash, facial/tongue/throat swelling, SOB or lightheadedness with hypotension: Yes Has patient had a PCN reaction causing severe rash involving mucus membranes or skin necrosis: No Has patient had a PCN reaction that required hospitalization: No Has patient had a PCN reaction occurring within the last 10 years: No If all of the above answers are "NO", then may proceed with Cephalosporin use.     HOME MEDICATIONS: Outpatient Medications Prior to Visit  Medication Sig Dispense Refill   ALPRAZolam (XANAX) 1 MG tablet Take 1 mg by mouth 2 (two) times daily.     aspirin EC 81 MG EC tablet Take 1 tablet (81 mg total) by  mouth daily. 30 tablet 0   BREZTRI AEROSPHERE 160-9-4.8 MCG/ACT AERO Inhale 2 puffs into the lungs in the morning and at bedtime. 10.7 g 5   Cholecalciferol (VITAMIN D) 2000 units CAPS Take 1 capsule (2,000 Units total) by mouth daily. 30 capsule 5   ciclopirox (PENLAC) 8 % solution Apply one coat to toenail qd x 48 weeks.  Remove weekly with polish remover. 6.6 mL 11   HYDROcodone-acetaminophen (NORCO/VICODIN) 5-325 MG tablet Take 1 tablet by mouth 2 (two) times daily as needed for pain.  0   ipratropium-albuterol (DUONEB) 0.5-2.5 (3) MG/3ML SOLN Take 3 mLs by nebulization every 6 (six) hours as needed. 90 mL 3   meloxicam (MOBIC) 7.5 MG tablet Take 7.5 mg by mouth daily.     metoprolol succinate (TOPROL-XL) 25 MG 24 hr tablet Take 0.5 tablets (12.5 mg total) by mouth daily. 60 tablet 0   PROAIR HFA 108 (90 Base) MCG/ACT inhaler Inhale 2 puffs into the lungs 4 (four) times daily as needed for wheezing. 1 each 5   Spacer/Aero-Holding Chambers (AEROCHAMBER MV) inhaler Use as instructed 1 each 0   No facility-administered medications prior to visit.    PAST MEDICAL HISTORY: Past Medical History:  Diagnosis Date   Anxiety    CHF (congestive heart failure) (HCC)    COPD (chronic obstructive pulmonary disease) (HCC)    Lumbar herniated disc    Tobacco abuse     PAST SURGICAL HISTORY: Past Surgical History:  Procedure Laterality Date   LEFT  HEART CATH AND CORONARY ANGIOGRAPHY N/A 02/05/2018   Procedure: LEFT HEART CATH AND CORONARY ANGIOGRAPHY;  Surgeon: Yvonne Kendall, MD;  Location: [facility];  Service: Cardiovascular;  Laterality: N/A;    FAMILY HISTORY: Family History  Problem Relation Age of Onset   Liver cancer Mother    Breast cancer Sister    CAD Neg Hx     SOCIAL HISTORY: Social History   Socioeconomic History   Marital status: Widowed    Spouse name: Not on file   Number of children: Not on file   Years of education: Not on file   Highest education level:  Not on file  Occupational History   Not on file  Tobacco Use   Smoking status: Former    Packs/day: 1.50    Years: 53.00    Pack years: 79.50    Types: Cigarettes    Start date: 05/24/1967    Quit date: 01/07/2018    Years since quitting: 3.2   Smokeless tobacco: Never   Tobacco comments:    Smoked since age 48, quit 12/2017  Vaping Use   Vaping Use: Never used  Substance and Sexual Activity   Alcohol use: Yes    Comment: Rare, glass of wine no more than every few weeks   Drug use: Never   Sexual activity: Not Currently  Other Topics Concern   Not on file  Social History Narrative   Not on file   Social Determinants of Health   Financial Resource Strain: Not on file  Food Insecurity: Not on file  Transportation Needs: Not on file  Physical Activity: Not on file  Stress: Not on file  Social Connections: Not on file  Intimate Partner Violence: Not on file     PHYSICAL EXAM  GENERAL EXAM/CONSTITUTIONAL: Vitals:  Vitals:   04/20/21 1258  BP: 117/78  Pulse: 81  Weight: 134 lb 8 oz (61 kg)  Height: 5\' 8"  (1.727 m)   Body mass index is 20.45 kg/m. Wt Readings from Last 3 Encounters:  04/20/21 134 lb 8 oz (61 kg)  03/19/21 133 lb 3.2 oz (60.4 kg)  09/29/20 135 lb (61.2 kg)   Patient is in no distress; well developed, nourished and groomed; neck is supple  CARDIOVASCULAR: Examination of carotid arteries is normal; no carotid bruits Regular rate and rhythm, no murmurs Examination of peripheral vascular system by observation and palpation is normal  EYES: Pupils round and reactive to light, Visual fields full to confrontation, Extraocular movements intacts,   MUSCULOSKELETAL: Gait, strength, tone, movements noted in Neurologic exam below  NEUROLOGIC: MENTAL STATUS:  No flowsheet data found. awake, alert, oriented to person, place and time recent and remote memory intact normal attention and concentration language fluent, comprehension intact, naming  intact fund of knowledge appropriate  CRANIAL NERVE:  2nd, 3rd, 4th, 6th - pupils equal and reactive to light, visual fields full to confrontation, extraocular muscles intact, no nystagmus 5th - facial sensation symmetric 7th - facial strength symmetric 8th - hearing intact 9th - palate elevates symmetrically, uvula midline 11th - shoulder shrug symmetric 12th - tongue protrusion midline  MOTOR:  normal bulk and tone, full strength in the BUE, BLE  SENSORY:  normal and symmetric to light touch, pinprick, temperature, vibration  COORDINATION:  finger-nose-finger, fine finger movements normal  REFLEXES:  deep tendon reflexes present and symmetric  GAIT/STATION:  normal   DIAGNOSTIC DATA (LABS, IMAGING, TESTING) - I reviewed patient records, labs, notes, testing and imaging myself where  available.  Lab Results  Component Value Date   WBC 5.7 07/01/2020   HGB 13.6 07/01/2020   HCT 41.2 07/01/2020   MCV 90 07/01/2020   PLT 192 07/01/2020      Component Value Date/Time   NA 144 07/01/2020 1430   K 4.8 07/01/2020 1430   CL 106 07/01/2020 1430   CO2 23 07/01/2020 1430   GLUCOSE 89 07/01/2020 1430   GLUCOSE 154 (H) 03/05/2018 0327   BUN 15 07/01/2020 1430   CREATININE 0.85 07/01/2020 1430   CALCIUM 9.6 07/01/2020 1430   PROT 6.2 07/01/2020 1430   ALBUMIN 4.5 07/01/2020 1430   AST 18 07/01/2020 1430   ALT 19 07/01/2020 1430   ALKPHOS 85 07/01/2020 1430   BILITOT 0.4 07/01/2020 1430   GFRNONAA 69 07/01/2020 1430   GFRAA 79 07/01/2020 1430   Lab Results  Component Value Date   CHOL 129 07/01/2020   HDL 59 07/01/2020   LDLCALC 53 07/01/2020   TRIG 86 07/01/2020   CHOLHDL 2.2 07/01/2020   No results found for: HGBA1C No results found for: VITAMINB12 Lab Results  Component Value Date   TSH 0.870 02/01/2018     ASSESSMENT AND PLAN  70 y.o. year old female with anxiety/panic attack, COPD on home oxygen who is presenting with numbness in the bilateral feet  and hands for the past 6 months.  Patient report numbness is worse at night right prior to going to bed.  She did tried gabapentin in the past without clear benefit.  Due to her anxiety and panic attack I will try her on duloxetine 60 mg daily.  Advised patient to call me for any questions or concerns, I will see her in 6 months for follow-up.  I will also contact the patient to go over the test result.     1. Other polyneuropathy     PLAN: Start with Duloxetine 60 mg daily  Continue your other medications  We will check Hemoglobin A1C, TSH and Vitamin B 12 levels today  Return in 6 months   Orders Placed This Encounter  Procedures   Hemoglobin A1c   TSH   Vitamin B12    Meds ordered this encounter  Medications   DULoxetine (CYMBALTA) 60 MG capsule    Sig: Take 1 capsule (60 mg total) by mouth daily.    Dispense:  30 capsule    Refill:  0    Return in about 6 months (around 10/18/2021).    Windell Norfolk, MD 04/20/2021, 1:49 PM  [facility] [address], [address] [place], [place] [address] [phone]

## 2021-04-21 ENCOUNTER — Telehealth: Payer: Self-pay

## 2021-04-21 LAB — HEMOGLOBIN A1C
Est. average glucose Bld gHb Est-mCnc: 117 mg/dL
Hgb A1c MFr Bld: 5.7 % — ABNORMAL HIGH (ref 4.8–5.6)

## 2021-04-21 LAB — TSH: TSH: 2.95 u[IU]/mL (ref 0.450–4.500)

## 2021-04-21 LAB — VITAMIN B12: Vitamin B-12: 310 pg/mL (ref 232–1245)

## 2021-04-21 NOTE — Progress Notes (Signed)
Please call and advise the patient that the recent labs we checked were within normal limits. We checked vitamin B12 level, thyroid function, and hemoglobin A1Cl. No further action is required on these tests at this time. Please remind patient to keep any upcoming appointments or tests and to call us with any interim questions, concerns, problems or updates. Thanks,   Windell Norfolk, MD

## 2021-04-21 NOTE — Telephone Encounter (Signed)
Spoke with patient to inform of lab results. Patient verbalized understanding. All questions answered.

## 2021-04-21 NOTE — Telephone Encounter (Signed)
-----   Message from Windell Norfolk, MD sent at 04/21/2021  1:37 PM EST ----- Please call and advise the patient that the recent labs we checked were within normal limits. We checked vitamin B12 level, thyroid function, and hemoglobin A1Cl. No further action is required on these tests at this time. Please remind patient to keep any upcoming appointments or tests and to call us with any interim questions, concerns, problems or updates. Thanks,   Windell Norfolk, MD

## 2021-04-23 ENCOUNTER — Telehealth: Payer: Self-pay | Admitting: Neurology

## 2021-04-23 ENCOUNTER — Ambulatory Visit: Payer: Medicare Other | Admitting: Dietician

## 2021-04-23 NOTE — Telephone Encounter (Signed)
Pt unsure if she has a stomach virus or if she is having diarrhea as a result of the DULoxetine (CYMBALTA) 60 MG capsule

## 2021-04-26 NOTE — Telephone Encounter (Signed)
She started duloxetine 60mg  daily on 04/21/21. Reports only having diarrhea for 24 hours.  Over the last two days, she has felt sluggish and nauseated. Thinks she may be getting a "bug". No vomiting or fever. She wants to continue the medication for now. She will call back if the symptoms persist.

## 2021-05-17 ENCOUNTER — Other Ambulatory Visit: Payer: Self-pay | Admitting: Neurology

## 2021-06-15 ENCOUNTER — Encounter: Payer: Self-pay | Admitting: Podiatry

## 2021-06-15 ENCOUNTER — Ambulatory Visit (INDEPENDENT_AMBULATORY_CARE_PROVIDER_SITE_OTHER): Payer: Medicare Other | Admitting: Podiatry

## 2021-06-15 ENCOUNTER — Other Ambulatory Visit: Payer: Self-pay

## 2021-06-15 DIAGNOSIS — M79674 Pain in right toe(s): Secondary | ICD-10-CM

## 2021-06-15 DIAGNOSIS — M79675 Pain in left toe(s): Secondary | ICD-10-CM

## 2021-06-15 DIAGNOSIS — R200 Anesthesia of skin: Secondary | ICD-10-CM

## 2021-06-15 DIAGNOSIS — G629 Polyneuropathy, unspecified: Secondary | ICD-10-CM | POA: Diagnosis not present

## 2021-06-15 DIAGNOSIS — B351 Tinea unguium: Secondary | ICD-10-CM

## 2021-06-15 DIAGNOSIS — R202 Paresthesia of skin: Secondary | ICD-10-CM

## 2021-06-15 NOTE — Progress Notes (Signed)
°  Subjective:  Patient ID: Vickie Bowman, female    DOB: 12/27/1950,  MRN: [id number]  SHANANN PARRAGA presents to clinic today for painful elongated mycotic toenails 1-5 bilaterally which are tender when wearing enclosed shoe gear. Pain is relieved with periodic professional debridement.  Patient states she did attend Neurology for consultation of numbness of bilateral LE. She was placed on Cymbalta and states she is not sure if it is working.  PCP is Tamsen Roers, MD , and last visit was December, 2022.  Allergies  Allergen Reactions   Penicillins Swelling    Has patient had a PCN reaction causing immediate rash, facial/tongue/throat swelling, SOB or lightheadedness with hypotension: Yes Has patient had a PCN reaction causing severe rash involving mucus membranes or skin necrosis: No Has patient had a PCN reaction that required hospitalization: No Has patient had a PCN reaction occurring within the last 10 years: No If all of the above answers are "NO", then may proceed with Cephalosporin use.     Review of Systems: Negative except as noted in the HPI. Objective:   Constitutional Vickie Bowman is a pleasant 71 y.o. Caucasian female, thin build in NAD. AAO x 3. Patient is on portable oxygen concentrator in office.  Vascular CFT <3 seconds b/l LE. Palpable DP/PT pulses b/l LE. Digital hair sparse b/l. Skin temperature gradient WNL b/l. No pain with calf compression b/l. No edema noted b/l. No cyanosis or clubbing noted b/l LE.  Neurologic Normal speech. Oriented to person, place, and time. Pt has subjective symptoms of neuropathy. Protective sensation intact 5/5 intact bilaterally with 10g monofilament b/l. Vibratory sensation intact b/l.  Dermatologic Pedal integument with normal turgor, texture and tone b/l LE. No open wounds b/l. No interdigital macerations b/l. Toenails 1-5 b/l elongated, thickened, discolored with subungual debris. +Tenderness with dorsal palpation of nailplates.  No hyperkeratotic or porokeratotic lesions present.  Orthopedic: HAV with bunion deformity noted b/l LE. Severe hammertoe deformity noted R 2nd toe.   Radiographs: None  Last A1c:  Hemoglobin A1C Latest Ref Rng & Units 04/20/2021  HGBA1C 4.8 - 5.6 % 5.7(H)  Some recent data might be hidden   Assessment:   1. Pain due to onychomycosis of toenails of both feet   2. Numbness and tingling of both lower extremities   3. Neuropathy    Plan:  Patient was evaluated and treated and all questions answered. Consent given for treatment as described below: -She has bilateral numbness in both LE; appreciate Neurology consultation. We did discuss the age of her mattress and she admits it is time to replace hers as she needs a firm mattress. -Mycotic toenails 1-5 bilaterally were debrided in length and girth with sterile nail nippers and dremel without incident. -Patient/POA to call should there be question/concern in the interim.  Return in about 3 months (around 09/13/2021).  Marzetta Board, DPM

## 2021-06-29 ENCOUNTER — Telehealth: Payer: Self-pay | Admitting: Pulmonary Disease

## 2021-06-29 MED ORDER — BREZTRI AEROSPHERE 160-9-4.8 MCG/ACT IN AERO: 2.0000 | INHALATION_SPRAY | Freq: Two times a day (BID) | RESPIRATORY_TRACT | 11 refills | Status: DC

## 2021-06-29 NOTE — Telephone Encounter (Signed)
Spoke with the pt  She is needing Breztri refilled  Rx sent  Nothing further needed

## 2021-08-05 ENCOUNTER — Telehealth: Payer: Self-pay | Admitting: Pulmonary Disease

## 2021-08-05 NOTE — Telephone Encounter (Signed)
Called and spoke to pharmacy and confirmed the Duoneb is on back order, they are unsure when they will have this in stock.  ? ?ATC pt x 2. Unable to leave VM. Will need to see if there is another pharmacy she would like to use to send Duoneb to before scripting a new med.  ?

## 2021-08-05 NOTE — Telephone Encounter (Signed)
I called the pt and notified that duoneb and albuterol neb are on national shortage  ?She wants to try another [facility] : [facility], and she will call them and see if they have any  ?I already called [facility] and they have a small supply of the plain albuterol 0.083% ? ?Dr Valeta Harms, if she can not the duoneb from [place] [facility] can we give plain albuterol? ?

## 2021-08-05 NOTE — Telephone Encounter (Signed)
Patient would like to try [facility] [address]. Patient phone number is [phone]. ?

## 2021-08-09 MED ORDER — ALBUTEROL SULFATE (2.5 MG/3ML) 0.083% IN NEBU: 2.5000 mg | INHALATION_SOLUTION | Freq: Four times a day (QID) | RESPIRATORY_TRACT | 12 refills | Status: DC | PRN

## 2021-08-09 NOTE — Telephone Encounter (Signed)
Called and spoke with patient to let her know that we were going to send an RX to her pharmacy for Albuterol since the Duoneb is on back order. She expressed understanding. Order has been sent. Nothing further needed at this time.  ?

## 2021-08-13 ENCOUNTER — Other Ambulatory Visit: Payer: Self-pay

## 2021-08-13 DIAGNOSIS — Z87891 Personal history of nicotine dependence: Secondary | ICD-10-CM

## 2021-09-07 ENCOUNTER — Ambulatory Visit
Admission: RE | Admit: 2021-09-07 | Discharge: 2021-09-07 | Disposition: A | Discharge status: Home / Self Care | Payer: Medicare Other | Source: Ambulatory Visit | Attending: Acute Care | Admitting: Acute Care

## 2021-09-07 DIAGNOSIS — Z87891 Personal history of nicotine dependence: Secondary | ICD-10-CM

## 2021-09-09 ENCOUNTER — Other Ambulatory Visit: Payer: Self-pay

## 2021-09-09 DIAGNOSIS — Z87891 Personal history of nicotine dependence: Secondary | ICD-10-CM

## 2021-09-09 DIAGNOSIS — Z122 Encounter for screening for malignant neoplasm of respiratory organs: Secondary | ICD-10-CM

## 2021-09-14 ENCOUNTER — Ambulatory Visit (INDEPENDENT_AMBULATORY_CARE_PROVIDER_SITE_OTHER): Payer: Medicare Other | Admitting: Podiatry

## 2021-09-14 ENCOUNTER — Encounter: Payer: Self-pay | Admitting: Podiatry

## 2021-09-14 DIAGNOSIS — M79675 Pain in left toe(s): Secondary | ICD-10-CM

## 2021-09-14 DIAGNOSIS — B351 Tinea unguium: Secondary | ICD-10-CM

## 2021-09-14 DIAGNOSIS — M79674 Pain in right toe(s): Secondary | ICD-10-CM | POA: Diagnosis not present

## 2021-09-14 DIAGNOSIS — G629 Polyneuropathy, unspecified: Secondary | ICD-10-CM

## 2021-09-21 NOTE — Progress Notes (Signed)
?  Subjective:  ?Patient ID: Vickie Bowman, female    DOB: Oct 24, 1950,  MRN: [id number] ? ?Vickie Bowman presents to clinic today for painful thick toenails that are difficult to trim. Pain interferes with ambulation. Aggravating factors include wearing enclosed shoe gear. Pain is relieved with periodic professional debridement. ? ?New problem(s): She relates discomfort of bilateral great toes and wonder if she has a lesion between her toes. ? ?Polyneuropathy confirmed via Neurology consult and she was started on Cymbalta. ? ?Patient states she has no PCP currently. ? ?Allergies  ?Allergen Reactions  ? Penicillins Swelling  ?  Has patient had a PCN reaction causing immediate rash, facial/tongue/throat swelling, SOB or lightheadedness with hypotension: Yes ?Has patient had a PCN reaction causing severe rash involving mucus membranes or skin necrosis: No ?Has patient had a PCN reaction that required hospitalization: No ?Has patient had a PCN reaction occurring within the last 10 years: No ?If all of the above answers are "NO", then may proceed with Cephalosporin use. ?  ? ? ?Review of Systems: Negative except as noted in the HPI. ? ?Objective: No changes noted in today's physical examination. ?Constitutional Vickie Bowman is a pleasant 71 y.o. Caucasian female, thin build in NAD. AAO x 3. Patient is on portable oxygen concentrator in office.  ?Vascular CFT <3 seconds b/l LE. Palpable DP/PT pulses b/l LE. Digital hair sparse b/l. Skin temperature gradient WNL b/l. No pain with calf compression b/l. No edema noted b/l. No cyanosis or clubbing noted b/l LE.  ?Neurologic Normal speech. Oriented to person, place, and time. Pt has subjective symptoms of neuropathy. Protective sensation intact 5/5 intact bilaterally with 10g monofilament b/l. Vibratory sensation intact b/l.  ?Dermatologic Pedal integument with normal turgor, texture and tone b/l LE. No open wounds b/l. No interdigital macerations b/l. Toenails 1-5 b/l  elongated, thickened, discolored with subungual debris. +Tenderness with dorsal palpation of nailplates. No hyperkeratotic or porokeratotic lesions present.  ?Orthopedic: HAV with bunion deformity noted b/l LE. Severe hammertoe deformity noted R 2nd toe. No evidence of interdigital lesions bilateral great toes and bilateral 2nd toes.  ? ?Radiographs: None ? ?  Latest Ref Rng & Units 04/20/2021  ?  1:30 PM  ?Hemoglobin A1C  ?Hemoglobin-A1c 4.8 - 5.6 % 5.7    ? ?Assessment/Plan: ?1. Pain due to onychomycosis of toenails of both feet   ?2. Polyneuropathy   ?-Patient was evaluated and treated. All patient's and/or POA's questions/concerns answered on today's visit. ?-Toenails 1-5 b/l were debrided in length and girth with sterile nail nippers and dremel without iatrogenic bleeding.  ?-Dispensed foam toe separators for 1st webspace. Apply to 1st webspace bilaterally every morning. Remove every evening. ?-Patient/POA to call should there be question/concern in the interim.  ? ?Return in about 3 months (around 12/14/2021). ? ?Freddie Breech, DPM  ?

## 2021-09-28 ENCOUNTER — Ambulatory Visit (INDEPENDENT_AMBULATORY_CARE_PROVIDER_SITE_OTHER): Payer: Medicare Other | Admitting: Nurse Practitioner

## 2021-09-28 ENCOUNTER — Encounter: Payer: Self-pay | Admitting: Nurse Practitioner

## 2021-09-28 VITALS — BP 111/74 | HR 81 | Temp 97.6°F | Ht 68.11 in | Wt 127.8 lb

## 2021-09-28 DIAGNOSIS — F419 Anxiety disorder, unspecified: Secondary | ICD-10-CM

## 2021-09-28 DIAGNOSIS — M545 Low back pain, unspecified: Secondary | ICD-10-CM

## 2021-09-28 DIAGNOSIS — Z7689 Persons encountering health services in other specified circumstances: Secondary | ICD-10-CM

## 2021-09-28 DIAGNOSIS — Z1211 Encounter for screening for malignant neoplasm of colon: Secondary | ICD-10-CM

## 2021-09-28 DIAGNOSIS — I2510 Atherosclerotic heart disease of native coronary artery without angina pectoris: Secondary | ICD-10-CM

## 2021-09-28 DIAGNOSIS — G8929 Other chronic pain: Secondary | ICD-10-CM | POA: Insufficient documentation

## 2021-09-28 DIAGNOSIS — J441 Chronic obstructive pulmonary disease with (acute) exacerbation: Secondary | ICD-10-CM

## 2021-09-28 MED ORDER — METHYLPREDNISOLONE 4 MG PO TBPK: ORAL_TABLET | ORAL | 0 refills | Status: DC

## 2021-09-28 NOTE — Progress Notes (Signed)
? ?New Patient Office Visit ? ?Subjective   ? ?Patient ID: Vickie GillesSylvia J Bowman, female    DOB: Oct 29, 1950  Age: 71 y.o. MRN: [id number] ? ?CC:  ?Chief Complaint  ?Patient presents with  ? New Patient (Initial Visit)  ? ? ?HPI ?Vickie GillesSylvia J Bowman presents to establish care ?The patient is coming from another local provider who has closed his practice.  ?-she does have history of COPD. Sees pulmonology. Quit smoking four years ago. Had lung cancer screening CT which showed benign findings.  ?-has cough. This is deep and loose. Concerned that this may be something indicative of illness. She has noted some increased shortness of breath. She currently uses oxygen at 2 liters per minute. She states that sometimes she turns it up to 3 lpm if she feels short of breath . ?-she sees cardiology. Does have atherosclerosis as noted on recent CT for lung cancer screening. She does see cardiology.  ?-she is currently prescribed hydrocodone/APAP 7.5/325 mg tablets. Instructions are to take TID as needed for pain. She states that she takes these only as needed and this is mostly for her back. Was prescribed these per her previous PCP and has been taking them for at least the past 4 years.  ?-currently prescribed alprazolam 1mg  twice daily as needed. She states that she does take 1 tablet in the morning and one in the evening. She also takes duloxetine 60mg  daily. She states that, even with the cymbalta, she has pretty severe panic attacks.  ?-reviewed PDMP profile today. Her overdose risk score is 190.  Last filled hydrocodone RX 08/24/2021. Was given #120 tablets. Last fill for alprazolam was 08/24/2021 and she was given #120 tablets.  ? ?Outpatient Encounter Medications as of 09/28/2021  ?Medication Sig  ? albuterol (PROVENTIL) (2.5 MG/3ML) 0.083% nebulizer solution Take 3 mLs (2.5 mg total) by nebulization every 6 (six) hours as needed for wheezing or shortness of breath.  ? ALPRAZolam (XANAX) 1 MG tablet Take 1 mg by mouth 2 (two) times  daily.  ? aspirin EC 81 MG EC tablet Take 1 tablet (81 mg total) by mouth daily.  ? BREZTRI AEROSPHERE 160-9-4.8 MCG/ACT AERO Inhale 2 puffs into the lungs in the morning and at bedtime.  ? Cholecalciferol (VITAMIN D) 2000 units CAPS Take 1 capsule (2,000 Units total) by mouth daily.  ? ciclopirox (PENLAC) 8 % solution Apply one coat to toenail qd x 48 weeks.  Remove weekly with polish remover.  ? DULoxetine (CYMBALTA) 60 MG capsule Take 1 capsule (60 mg total) by mouth daily.  ? famotidine (PEPCID) 20 MG tablet SMARTSIG:1 Tablet(s) By Mouth Every 12 Hours PRN  ? HYDROcodone-acetaminophen (NORCO) 7.5-325 MG tablet Take 1 tablet by mouth 3 (three) times daily as needed.  ? ipratropium-albuterol (DUONEB) 0.5-2.5 (3) MG/3ML SOLN Take 3 mLs by nebulization every 6 (six) hours as needed.  ? levofloxacin (LEVAQUIN) 500 MG tablet Take 500 mg by mouth daily.  ? meloxicam (MOBIC) 7.5 MG tablet Take 7.5 mg by mouth daily.  ? methylPREDNISolone (MEDROL) 4 MG TBPK tablet Take by mouth as directed for 6 days  ? metoprolol succinate (TOPROL-XL) 25 MG 24 hr tablet Take 0.5 tablets (12.5 mg total) by mouth daily.  ? PROAIR HFA 108 (90 Base) MCG/ACT inhaler Inhale 2 puffs into the lungs 4 (four) times daily as needed for wheezing.  ? Spacer/Aero-Holding Chambers (AEROCHAMBER MV) inhaler Use as instructed  ? ?No facility-administered encounter medications on file as of 09/28/2021.  ? ? ?Past Medical  History:  ?Diagnosis Date  ? Anxiety   ? CHF (congestive heart failure) (HCC)   ? COPD (chronic obstructive pulmonary disease) (HCC)   ? Lumbar herniated disc   ? Tobacco abuse   ? ? ?Past Surgical History:  ?Procedure Laterality Date  ? LEFT HEART CATH AND CORONARY ANGIOGRAPHY N/A 02/05/2018  ? Procedure: LEFT HEART CATH AND CORONARY ANGIOGRAPHY;  Surgeon: Yvonne Kendall, MD;  Location: [facility];  Service: Cardiovascular;  Laterality: N/A;  ? ? ?Family History  ?Problem Relation Age of Onset  ? Liver cancer Mother   ? Breast  cancer Sister   ? CAD Neg Hx   ? ? ?Social History  ? ?Socioeconomic History  ? Marital status: Widowed  ?  Spouse name: Not on file  ? Number of children: Not on file  ? Years of education: Not on file  ? Highest education level: Not on file  ?Occupational History  ? Not on file  ?Tobacco Use  ? Smoking status: Former  ?  Packs/day: 1.50  ?  Years: 53.00  ?  Pack years: 79.50  ?  Types: Cigarettes  ?  Start date: 05/24/1967  ?  Quit date: 01/07/2018  ?  Years since quitting: 3.7  ? Smokeless tobacco: Never  ? Tobacco comments:  ?  Smoked since age 13, quit 12/2017  ?Vaping Use  ? Vaping Use: Never used  ?Substance and Sexual Activity  ? Alcohol use: Yes  ?  Comment: Rare, glass of wine no more than every few weeks  ? Drug use: Never  ? Sexual activity: Not Currently  ?Other Topics Concern  ? Not on file  ?Social History Narrative  ? Not on file  ? ?Social Determinants of Health  ? ?Financial Resource Strain: Not on file  ?Food Insecurity: Not on file  ?Transportation Needs: Not on file  ?Physical Activity: Not on file  ?Stress: Not on file  ?Social Connections: Not on file  ?Intimate Partner Violence: Not on file  ? ? ?Review of Systems  ?Constitutional:  Negative for chills, fever and malaise/fatigue.  ?HENT:  Negative for congestion, sinus pain and sore throat.   ?Eyes: Negative.   ?Respiratory:  Positive for cough and shortness of breath. Negative for wheezing.   ?Cardiovascular:  Negative for chest pain, palpitations and leg swelling.  ?Gastrointestinal:  Negative for constipation, diarrhea, nausea and vomiting.  ?Genitourinary: Negative.   ?Musculoskeletal:  Positive for back pain. Negative for myalgias.  ?Skin: Negative.   ?Neurological:  Negative for dizziness and headaches.  ?Endo/Heme/Allergies:  Does not bruise/bleed easily.  ?Psychiatric/Behavioral:  Positive for depression. The patient is nervous/anxious.   ? ?  ? ? ?Objective   ? ?Today's Vitals  ? 09/28/21 1344  ?BP: 111/74  ?Pulse: 81  ?Temp: 97.6 ?F  (36.4 ?C)  ?SpO2: 96%  ?Weight: 127 lb 12.8 oz (58 kg)  ?Height: 5' 8.11" (1.73 m)  ? ?Body mass index is 19.37 kg/m?.  ? ?Physical Exam ?Vitals and nursing note reviewed.  ?Constitutional:   ?   Appearance: Normal appearance. She is well-developed.  ?HENT:  ?   Head: Normocephalic and atraumatic.  ?Eyes:  ?   Pupils: Pupils are equal, round, and reactive to light.  ?Cardiovascular:  ?   Rate and Rhythm: Normal rate and regular rhythm.  ?   Pulses: Normal pulses.  ?   Heart sounds: Normal heart sounds.  ?Pulmonary:  ?   Effort: Pulmonary effort is normal.  ?  Breath sounds: Normal breath sounds.  ?   Comments: Expiratory breath sounds are diminished throughout the lung fields.  ?Abdominal:  ?   Palpations: Abdomen is soft.  ?Musculoskeletal:     ?   General: Normal range of motion.  ?   Cervical back: Normal range of motion and neck supple.  ?   Comments: Chronic lower back pain   ?Lymphadenopathy:  ?   Cervical: No cervical adenopathy.  ?Skin: ?   General: Skin is warm and dry.  ?   Capillary Refill: Capillary refill takes less than 2 seconds.  ?Neurological:  ?   General: No focal deficit present.  ?   Mental Status: She is alert and oriented to person, place, and time.  ?Psychiatric:     ?   Attention and Perception: Attention and perception normal.     ?   Mood and Affect: Affect normal. Mood is anxious.     ?   Speech: Speech normal.     ?   Behavior: Behavior normal. Behavior is cooperative.     ?   Thought Content: Thought content normal.     ?   Cognition and Memory: Cognition and memory normal.     ?   Judgment: Judgment normal.  ? ? ? ? ?Assessment & Plan:  ?1. COPD with exacerbation (HCC) ?Start medrol taper. Take as directed for 6 days. Continue inhalers and respiratory medications as prescribed. Follow up with pulmonology as scheduled.  ?- methylPREDNISolone (MEDROL) 4 MG TBPK tablet; Take by mouth as directed for 6 days  Dispense: 21 tablet; Refill: 0 ? ?2. Coronary artery disease involving native  heart without angina pectoris, unspecified vessel or lesion type ?Continue regular visits with cardiology as scheduled.  ? ?3. Anxiety ?Patient currently taking duloxetine 60 mg daily. She is prescribed alprazolam

## 2021-10-19 ENCOUNTER — Ambulatory Visit: Payer: Medicare Other | Admitting: Neurology

## 2021-10-26 ENCOUNTER — Encounter: Payer: Self-pay | Admitting: Neurology

## 2021-10-26 ENCOUNTER — Telehealth: Payer: Self-pay | Admitting: Neurology

## 2021-10-26 ENCOUNTER — Ambulatory Visit (INDEPENDENT_AMBULATORY_CARE_PROVIDER_SITE_OTHER): Payer: Medicare Other | Admitting: Neurology

## 2021-10-26 VITALS — BP 114/77 | HR 65 | Ht 68.0 in | Wt 131.5 lb

## 2021-10-26 DIAGNOSIS — G6289 Other specified polyneuropathies: Secondary | ICD-10-CM

## 2021-10-26 MED ORDER — PREGABALIN 75 MG PO CAPS: 75.0000 mg | ORAL_CAPSULE | Freq: Two times a day (BID) | ORAL | 11 refills | Status: DC

## 2021-10-26 MED ORDER — PREGABALIN 75 MG PO CAPS: 75.0000 mg | ORAL_CAPSULE | Freq: Two times a day (BID) | ORAL | 5 refills | Status: DC

## 2021-10-26 NOTE — Progress Notes (Signed)
[facility]  PATIENT: Vickie Bowman DOB: [birth year]  REQUESTING CLINICIAN: Aida Puffer, MD HISTORY FROM: Patient  REASON FOR VISIT: Bilateral feet and hands numbness    HISTORICAL  CHIEF COMPLAINT:  Chief Complaint  Patient presents with   Follow-up    Rm 13. Alone. Continues to have numbness in hands and feet, especially at night. Duloxetine is not very helpful.   INTERVAL HISTORY 10/26/21:  Patient presents today for follow-up, last visit was in December.  At that time plan was to start duloxetine 60 mg daily.  She is compliant with the medication, states that it helps with the neuropathy but she still feels the pain mostly at night.  She has not tried any additional medication for it.  Again pain is described as tingling and burning with constant numbness in both feet and hands.  Sample given to patient Ztlido   HISTORY OF PRESENT ILLNESS:  This is a 71 year old woman with past medical history of anxiety and panic attack, COPD on additional oxygen who is presenting with numbness in both feet and hands.  Patient stated numbness started in the past 63-month, she notices more when going to bed.  Denies any pain in the lower extremities, denies any back pain and denies any shooting pain.  She reported trying gabapentin in the past without benefit. Currently she is not on medication for numbness. Denies any history of diabetes.  Denies any falls.     OTHER MEDICAL CONDITIONS: Anxiety, COPD, Panic attack   REVIEW OF SYSTEMS: Full 14 system review of systems performed and negative with exception of: as noted in the HPI   ALLERGIES: Allergies  Allergen Reactions   Penicillins Swelling    Has patient had a PCN reaction causing immediate rash, facial/tongue/throat swelling, SOB or lightheadedness with hypotension: Yes Has patient had a PCN reaction causing severe rash involving mucus membranes or skin necrosis: No Has patient had a PCN reaction that required  hospitalization: No Has patient had a PCN reaction occurring within the last 10 years: No If all of the above answers are "NO", then may proceed with Cephalosporin use.     HOME MEDICATIONS: Outpatient Medications Prior to Visit  Medication Sig Dispense Refill   albuterol (PROVENTIL) (2.5 MG/3ML) 0.083% nebulizer solution Take 3 mLs (2.5 mg total) by nebulization every 6 (six) hours as needed for wheezing or shortness of breath. 75 mL 12   ALPRAZolam (XANAX) 1 MG tablet Take 1 mg by mouth 2 (two) times daily.     aspirin EC 81 MG EC tablet Take 1 tablet (81 mg total) by mouth daily. 30 tablet 0   BREZTRI AEROSPHERE 160-9-4.8 MCG/ACT AERO Inhale 2 puffs into the lungs in the morning and at bedtime. 10.7 g 11   Cholecalciferol (VITAMIN D) 2000 units CAPS Take 1 capsule (2,000 Units total) by mouth daily. 30 capsule 5   ciclopirox (PENLAC) 8 % solution Apply one coat to toenail qd x 48 weeks.  Remove weekly with polish remover. 6.6 mL 11   DULoxetine (CYMBALTA) 60 MG capsule Take 1 capsule (60 mg total) by mouth daily. 30 capsule 5   famotidine (PEPCID) 20 MG tablet SMARTSIG:1 Tablet(s) By Mouth Every 12 Hours PRN     HYDROcodone-acetaminophen (NORCO) 7.5-325 MG tablet Take 1 tablet by mouth 3 (three) times daily as needed.     ipratropium-albuterol (DUONEB) 0.5-2.5 (3) MG/3ML SOLN Take 3 mLs by nebulization every 6 (six) hours as needed. 90 mL 3   levofloxacin (LEVAQUIN)  500 MG tablet Take 500 mg by mouth daily.     meloxicam (MOBIC) 7.5 MG tablet Take 7.5 mg by mouth daily.     methylPREDNISolone (MEDROL) 4 MG TBPK tablet Take by mouth as directed for 6 days 21 tablet 0   metoprolol succinate (TOPROL-XL) 25 MG 24 hr tablet Take 0.5 tablets (12.5 mg total) by mouth daily. 60 tablet 0   PROAIR HFA 108 (90 Base) MCG/ACT inhaler Inhale 2 puffs into the lungs 4 (four) times daily as needed for wheezing. 1 each 5   Spacer/Aero-Holding Chambers (AEROCHAMBER MV) inhaler Use as instructed 1 each 0    No facility-administered medications prior to visit.    PAST MEDICAL HISTORY: Past Medical History:  Diagnosis Date   Anxiety    CHF (congestive heart failure) (HCC)    COPD (chronic obstructive pulmonary disease) (HCC)    Lumbar herniated disc    Tobacco abuse     PAST SURGICAL HISTORY: Past Surgical History:  Procedure Laterality Date   LEFT HEART CATH AND CORONARY ANGIOGRAPHY N/A 02/05/2018   Procedure: LEFT HEART CATH AND CORONARY ANGIOGRAPHY;  Surgeon: Yvonne Kendall, MD;  Location: [facility];  Service: Cardiovascular;  Laterality: N/A;    FAMILY HISTORY: Family History  Problem Relation Age of Onset   Liver cancer Mother    Breast cancer Sister    CAD Neg Hx     SOCIAL HISTORY: Social History   Socioeconomic History   Marital status: Widowed    Spouse name: Not on file   Number of children: Not on file   Years of education: Not on file   Highest education level: Not on file  Occupational History   Not on file  Tobacco Use   Smoking status: Former    Packs/day: 1.50    Years: 53.00    Pack years: 79.50    Types: Cigarettes    Start date: 05/24/1967    Quit date: 01/07/2018    Years since quitting: 3.8   Smokeless tobacco: Never   Tobacco comments:    Smoked since age 59, quit 12/2017  Vaping Use   Vaping Use: Never used  Substance and Sexual Activity   Alcohol use: Yes    Comment: Rare, glass of wine no more than every few weeks   Drug use: Never   Sexual activity: Not Currently  Other Topics Concern   Not on file  Social History Narrative   Not on file   Social Determinants of Health   Financial Resource Strain: Not on file  Food Insecurity: Not on file  Transportation Needs: Not on file  Physical Activity: Not on file  Stress: Not on file  Social Connections: Not on file  Intimate Partner Violence: Not on file     PHYSICAL EXAM  GENERAL EXAM/CONSTITUTIONAL: Vitals:  Vitals:   10/26/21 1344  BP: 114/77  Pulse: 65   Weight: 131 lb 8 oz (59.6 kg)  Height: 5\' 8"  (1.727 m)    Body mass index is 19.99 kg/m. Wt Readings from Last 3 Encounters:  10/26/21 131 lb 8 oz (59.6 kg)  09/28/21 127 lb 12.8 oz (58 kg)  04/20/21 134 lb 8 oz (61 kg)   Patient is in no distress; well developed, nourished and groomed; neck is supple  EYES: Pupils round and reactive to light, Visual fields full to confrontation, Extraocular movements intacts,   MUSCULOSKELETAL: Gait, strength, tone, movements noted in Neurologic exam below  NEUROLOGIC: MENTAL STATUS:  View : No data to display.         awake, alert, oriented to person, place and time recent and remote memory intact normal attention and concentration language fluent, comprehension intact, naming intact fund of knowledge appropriate  CRANIAL NERVE:  2nd, 3rd, 4th, 6th - pupils equal and reactive to light, visual fields full to confrontation, extraocular muscles intact, no nystagmus 5th - facial sensation symmetric 7th - facial strength symmetric 8th - hearing intact 9th - palate elevates symmetrically, uvula midline 11th - shoulder shrug symmetric 12th - tongue protrusion midline  MOTOR:  normal bulk and tone, full strength in the BUE, BLE  SENSORY:  Decrease vibration up to ankle bilaterally   COORDINATION:  finger-nose-finger, fine finger movements normal  REFLEXES:  deep tendon reflexes present and symmetric  GAIT/STATION:  normal   DIAGNOSTIC DATA (LABS, IMAGING, TESTING) - I reviewed patient records, labs, notes, testing and imaging myself where available.  Lab Results  Component Value Date   WBC 5.7 07/01/2020   HGB 13.6 07/01/2020   HCT 41.2 07/01/2020   MCV 90 07/01/2020   PLT 192 07/01/2020      Component Value Date/Time   NA 144 07/01/2020 1430   K 4.8 07/01/2020 1430   CL 106 07/01/2020 1430   CO2 23 07/01/2020 1430   GLUCOSE 89 07/01/2020 1430   GLUCOSE 154 (H) 03/05/2018 0327   BUN 15 07/01/2020 1430    CREATININE 0.85 07/01/2020 1430   CALCIUM 9.6 07/01/2020 1430   PROT 6.2 07/01/2020 1430   ALBUMIN 4.5 07/01/2020 1430   AST 18 07/01/2020 1430   ALT 19 07/01/2020 1430   ALKPHOS 85 07/01/2020 1430   BILITOT 0.4 07/01/2020 1430   GFRNONAA 69 07/01/2020 1430   GFRAA 79 07/01/2020 1430   Lab Results  Component Value Date   CHOL 129 07/01/2020   HDL 59 07/01/2020   LDLCALC 53 07/01/2020   TRIG 86 07/01/2020   CHOLHDL 2.2 07/01/2020   Lab Results  Component Value Date   HGBA1C 5.7 (H) 04/20/2021   Lab Results  Component Value Date   VITAMINB12 310 04/20/2021   Lab Results  Component Value Date   TSH 2.950 04/20/2021     ASSESSMENT AND PLAN  71 y.o. year old female with anxiety/panic attack, COPD on home oxygen who is presenting with numbness in the bilateral feet and hands for the past 6 months.  Patient report numbness is worse at night right prior to going to bed consistent with peripheral neuropathy.  She did tried gabapentin in the past without clear benefit.  Due to her anxiety and panic attack, she was started on duloxetine 60 mg daily but pain not fully controlled.  I will add Lyrica 75 mg BID and also recommend lidocaine patch. She voices understanding, patient will call to update [place]. Follow up in 6 months or sooner if worse.    1. Other polyneuropathy     Patient Instructions  Continue Duloxetine 60 mg daily  Start with Lyrica 75 mg twice daily  Trial of Lidocaine patch (sample given to patient)  Can also try Aspercreme with Lidocaine  Return in 6 months    No orders of the defined types were placed in this encounter.   Meds ordered this encounter  Medications   pregabalin (LYRICA) 75 MG capsule    Sig: Take 1 capsule (75 mg total) by mouth 2 (two) times daily.    Dispense:  60 capsule    Refill:  11  Return in about 6 months (around 04/27/2022).  I have spent a total of 30 minutes dedicated to this patient today, preparing to see patient,  performing a medically appropriate examination and evaluation, ordering tests and/or medications and procedures, and counseling and educating the patient/family/caregiver; independently interpreting result and communicating results to the family/patient/caregiver; and documenting clinical information in the electronic medical record.   Windell NorfolkAmadou Emelynn Rance, MD 10/26/2021, 2:19 PM  [facility] [address], [address] [place], [place] [address] [phone]

## 2021-10-26 NOTE — Telephone Encounter (Signed)
Orders pended for MD

## 2021-10-26 NOTE — Patient Instructions (Addendum)
Continue Duloxetine 60 mg daily  Start with Lyrica 75 mg twice daily  Trial of Lidocaine patch (sample given to patient)  Can also try Aspercreme with Lidocaine  Return in 6 months

## 2021-10-26 NOTE — Telephone Encounter (Signed)
Mary from [facility] on [address]. Called stating that the pt's pregabalin (LYRICA) 75 MG capsule was denied. She states that the medication did not meet the Federal Requirement for [employer]. Please send Rx again.

## 2021-10-29 ENCOUNTER — Telehealth: Payer: Self-pay | Admitting: Neurology

## 2021-10-30 LAB — COLOGUARD

## 2021-10-31 NOTE — Progress Notes (Signed)
Need to reorder cologuard. Could not be processed. Complete at visit 11/09/2021

## 2021-11-01 ENCOUNTER — Ambulatory Visit (INDEPENDENT_AMBULATORY_CARE_PROVIDER_SITE_OTHER): Payer: Medicare Other | Admitting: Pulmonary Disease

## 2021-11-01 ENCOUNTER — Telehealth: Payer: Self-pay

## 2021-11-01 ENCOUNTER — Encounter: Payer: Self-pay | Admitting: Pulmonary Disease

## 2021-11-01 VITALS — BP 132/72 | HR 82 | Temp 98.4°F | Ht 68.0 in | Wt 132.2 lb

## 2021-11-01 DIAGNOSIS — E43 Unspecified severe protein-calorie malnutrition: Secondary | ICD-10-CM | POA: Diagnosis not present

## 2021-11-01 DIAGNOSIS — Z87891 Personal history of nicotine dependence: Secondary | ICD-10-CM | POA: Diagnosis not present

## 2021-11-01 DIAGNOSIS — J9611 Chronic respiratory failure with hypoxia: Secondary | ICD-10-CM

## 2021-11-01 DIAGNOSIS — J449 Chronic obstructive pulmonary disease, unspecified: Secondary | ICD-10-CM | POA: Diagnosis not present

## 2021-11-01 MED ORDER — BREZTRI AEROSPHERE 160-9-4.8 MCG/ACT IN AERO: 2.0000 | INHALATION_SPRAY | Freq: Two times a day (BID) | RESPIRATORY_TRACT | 11 refills | Status: DC

## 2021-11-01 MED ORDER — ALBUTEROL SULFATE (2.5 MG/3ML) 0.083% IN NEBU: 2.5000 mg | INHALATION_SOLUTION | Freq: Four times a day (QID) | RESPIRATORY_TRACT | 12 refills | Status: DC | PRN

## 2021-11-01 MED ORDER — PROAIR HFA 108 (90 BASE) MCG/ACT IN AERS: 2.0000 | INHALATION_SPRAY | Freq: Four times a day (QID) | RESPIRATORY_TRACT | 5 refills | Status: DC | PRN

## 2021-11-01 NOTE — Telephone Encounter (Signed)
Dr. Teresa Coombs pt. pt was told by pharmacist that she should not be taking pregabalin and xanax together. pt wants to know if she should wait a couple hours after taking her xanax to take her pregabalin.   FYI Pt has an appt at 1:30 if calling around that time.

## 2021-11-01 NOTE — Telephone Encounter (Signed)
I called and informed patient to start the Pregabalin at night, she should take it 2 hours earlier than her Xanax. Advise her to contact us for any adverse effects or any other questions.

## 2021-11-01 NOTE — Patient Instructions (Addendum)
Thank you for visiting Dr. Valeta Harms at [facility]. Today we recommend the following:  Meds ordered this encounter  Medications   albuterol (PROVENTIL) (2.5 MG/3ML) 0.083% nebulizer solution    Sig: Take 3 mLs (2.5 mg total) by nebulization every 6 (six) hours as needed for wheezing or shortness of breath.    Dispense:  75 mL    Refill:  12   PROAIR HFA 108 (90 Base) MCG/ACT inhaler    Sig: Inhale 2 puffs into the lungs 4 (four) times daily as needed for wheezing.    Dispense:  1 each    Refill:  5   BREZTRI AEROSPHERE 160-9-4.8 MCG/ACT AERO    Sig: Inhale 2 puffs into the lungs in the morning and at bedtime.    Dispense:  10.7 g    Refill:  11   Return in about 1 year (around 11/02/2022), or if symptoms worsen or fail to improve, for with APP or Dr. Valeta Harms.    Please do your part to reduce the spread of COVID-19.

## 2021-11-01 NOTE — Progress Notes (Signed)
Synopsis: Referred in November 2019 for COPD by Ronnell Freshwater, NP  Subjective:   PATIENT ID: Vickie Bowman GENDER: female DOB: 02-17-1951, MRN: [id number]  Chief Complaint  Patient presents with   Follow-up    PMH of CHF, tobacco abuse, quit smoking Aug 2019. Smoked since age 71 years.  Patient was admitted to the hospital in September 2019 as well as in October 2019 for COPD exacerbations.  The patient's most recent hospitalization for COPD she developed hypoxemic respiratory failure requiring nasal cannula supplementation.  Initial echocardiogram revealed a reduced ejection fraction and she was subsequently taken for a left heart cath in September 2019 which revealed nonobstructive CAD.  The patient was treated in the emergency room with CPAP, steroids, magnesium, albuterol.  Hospital course was relatively uncomplicated and the patient was discharged home on oxygen therapy.  Since her hospitalization she has been overall been doing well.  She does have significant dyspnea on exertion.  She feels like she has slowly been recovering.  She does acknowledge that she has been able to quit smoking.  She is currently on 3 L pulse from a POC.  She states that she has been trying to eat.  She does know that her BMI is low.  Patient denies chest pain, hemoptysis, daily sputum production.  OV 06/06/2018: She feels like she is a little congested. This has been getting worse throughout the past week. Cough is much worse. Not really bringing anything up at this time. She trys but it feels stuck.  Patient denies any fevers.  She has woke up in the middle the night with ongoing cough for the past several nights.  She does get up in the middle of the night and feel like her chest is more tight.  She has been using her albuterol more over the past couple of days.  Does not have any significant sputum production.  OV 11/29/2018: Here today for follow-up for management of severe COPD.  She has oxygen dependent  respiratory failure on oxygen concentrator today in the office at 3 L.  Patient currently managed well on Symbicort and Spiriva Respimat.  She likes her inhaler regimen.  She does notice that has made her breathe better.  Today in the office we discussed her goals of care.  She states that she remembers distinctly seeing her mother received cardiopulmonary resuscitation.  She would not ever want this to occur to her.  She understands that at some point she may get to the point where she is unable to breathe.  And would not want to be placed on mechanical life support.  We discussed all of this to include risk, benefits and alternatives in detail today in the office.  She is elected to proceed with filling out durable DNR forms today in the office.  As for her ongoing respiratory symptoms she wants to continue the use of her inhalers as long as possible.  We discussed the alternatives of using nebulized therapy as well.  She states that they also has trouble with her current POC machine.  She discussed this with her provider DME provider.  They need a new prescription to help with replacing the old one.  OV 06/26/2019: Patient seen today for follow-up regarding advanced COPD.  Last office visit she completed durable DNR forms in [place] form.  At this point she is doing well on Symbicort plus Spiriva.  She is very anxious about ever having to go back in the hospital she  would like to avoid this at all cost.  Otherwise from a respiratory standpoint she does have shortness of breath with exertion.  But she is still using her oxygen and trying to stay active as much as possible.  Her weight has been stable.  She denies night sweats fevers weight loss chills.  Patient denies hemoptysis  OV 10/02/2019: Patient is here today for COPD follow up.  Patient was started on breztri inhaler.  At this time doing well.  She still thinks that she is breathing much better than where she was before.  She can see a  significant difference after changing inhaler regimen.  She feels like she can breathe better in the evening time in comparison to her other meds.  She is able to take a bath without feeling as dyspneic as she was in the past.  Denies hemoptysis.  Is denies any significant cough or sputum production.  OV 03/26/2020: Patient here for COPD follow-up.  She was on breztri inhaler as treated previously.  She does feel like she has shortness of breath with exertion.  She does feel stable at this time with her current regimen but she is interested in potentially switching to Trelegy inhaler.  She has had some friends that have used and have done really well with it.  She would like to give it a shot to see if makes any difference in her respiratory symptoms.  Currently stable on 2 L pulse with a POC today in the office.  She has a planned noncontrasted CT chest lung cancer screening for April 2021.  OV 03/19/2021: doing well today. Copd stable. Able to complete ADLs.  Respiratory wise still has some dyspnea on exertion.  This is at her baseline.  No recent exacerbations.  OV 11/01/2021: Here today for COPD management.  Patient doing well.  Needs refills of Breztri albuterol and albuterol solution.  Still uses her oxygen daily.  Patient has no complaints at this time.  She had a lung cancer screening CT in April 2023 which was a lung RADS 2.  She has a repeat noncontrasted CT in April 2024 scheduled for yearly screening.  Her weight has been stable.  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 which is better on new medication regimen       Past Medical History:  Diagnosis Date   Anxiety    CHF (congestive heart failure) (HCC)    COPD (chronic obstructive pulmonary disease) (HCC)    Lumbar herniated disc    Tobacco abuse      Family History  Problem Relation Age of Onset   Liver cancer Mother    Breast cancer Sister    CAD Neg Hx      Past Surgical History:  Procedure Laterality  Date   LEFT HEART CATH AND CORONARY ANGIOGRAPHY N/A 02/05/2018   Procedure: LEFT HEART CATH AND CORONARY ANGIOGRAPHY;  Surgeon: Nelva Bush, MD;  Location: [facility];  Service: Cardiovascular;  Laterality: N/A;    Social History   Socioeconomic History   Marital status: Widowed    Spouse name: Not on file   Number of children: Not on file   Years of education: Not on file   Highest education level: Not on file  Occupational History   Not on file  Tobacco Use   Smoking status: Former    Packs/day: 1.50    Years: 53.00    Total pack years: 79.50    Types: Cigarettes  Start date: 05/24/1967    Quit date: 01/07/2018    Years since quitting: 3.8   Smokeless tobacco: Never   Tobacco comments:    Smoked since age 40, quit 12/2017  Vaping Use   Vaping Use: Never used  Substance and Sexual Activity   Alcohol use: Yes    Comment: Rare, glass of wine no more than every few weeks   Drug use: Never   Sexual activity: Not Currently  Other Topics Concern   Not on file  Social History Narrative   Not on file   Social Determinants of Health   Financial Resource Strain: Not on file  Food Insecurity: Not on file  Transportation Needs: Not on file  Physical Activity: Not on file  Stress: Not on file  Social Connections: Not on file  Intimate Partner Violence: Not on file     Allergies  Allergen Reactions   Penicillins Swelling    Has patient had a PCN reaction causing immediate rash, facial/tongue/throat swelling, SOB or lightheadedness with hypotension: Yes Has patient had a PCN reaction causing severe rash involving mucus membranes or skin necrosis: No Has patient had a PCN reaction that required hospitalization: No Has patient had a PCN reaction occurring within the last 10 years: No If all of the above answers are "NO", then may proceed with Cephalosporin use.      Outpatient Medications Prior to Visit  Medication Sig Dispense Refill   albuterol (PROVENTIL)  (2.5 MG/3ML) 0.083% nebulizer solution Take 3 mLs (2.5 mg total) by nebulization every 6 (six) hours as needed for wheezing or shortness of breath. 75 mL 12   ALPRAZolam (XANAX) 1 MG tablet Take 1 mg by mouth 2 (two) times daily.     aspirin EC 81 MG EC tablet Take 1 tablet (81 mg total) by mouth daily. 30 tablet 0   BREZTRI AEROSPHERE 160-9-4.8 MCG/ACT AERO Inhale 2 puffs into the lungs in the morning and at bedtime. 10.7 g 11   Cholecalciferol (VITAMIN D) 2000 units CAPS Take 1 capsule (2,000 Units total) by mouth daily. 30 capsule 5   ciclopirox (PENLAC) 8 % solution Apply one coat to toenail qd x 48 weeks.  Remove weekly with polish remover. 6.6 mL 11   DULoxetine (CYMBALTA) 60 MG capsule Take 1 capsule (60 mg total) by mouth daily. 30 capsule 5   famotidine (PEPCID) 20 MG tablet SMARTSIG:1 Tablet(s) By Mouth Every 12 Hours PRN     HYDROcodone-acetaminophen (NORCO) 7.5-325 MG tablet Take 1 tablet by mouth 3 (three) times daily as needed.     ipratropium-albuterol (DUONEB) 0.5-2.5 (3) MG/3ML SOLN Take 3 mLs by nebulization every 6 (six) hours as needed. 90 mL 3   levofloxacin (LEVAQUIN) 500 MG tablet Take 500 mg by mouth daily.     meloxicam (MOBIC) 7.5 MG tablet Take 7.5 mg by mouth daily.     methylPREDNISolone (MEDROL) 4 MG TBPK tablet Take by mouth as directed for 6 days 21 tablet 0   metoprolol succinate (TOPROL-XL) 25 MG 24 hr tablet Take 0.5 tablets (12.5 mg total) by mouth daily. 60 tablet 0   pregabalin (LYRICA) 75 MG capsule Take 1 capsule (75 mg total) by mouth 2 (two) times daily. 60 capsule 5   PROAIR HFA 108 (90 Base) MCG/ACT inhaler Inhale 2 puffs into the lungs 4 (four) times daily as needed for wheezing. 1 each 5   Spacer/Aero-Holding Chambers (AEROCHAMBER MV) inhaler Use as instructed 1 each 0   No facility-administered medications prior  to visit.    Review of Systems  Constitutional:  Negative for chills, fever, malaise/fatigue and weight loss.  HENT:  Negative for  hearing loss, sore throat and tinnitus.   Eyes:  Negative for blurred vision and double vision.  Respiratory:  Positive for cough and shortness of breath. Negative for hemoptysis, sputum production, wheezing and stridor.   Cardiovascular:  Negative for chest pain, palpitations, orthopnea, leg swelling and PND.  Gastrointestinal:  Negative for abdominal pain, constipation, diarrhea, heartburn, nausea and vomiting.  Genitourinary:  Negative for dysuria, hematuria and urgency.  Musculoskeletal:  Negative for joint pain and myalgias.       Neuropathic pain in lower extremities  Skin:  Negative for itching and rash.  Neurological:  Positive for tingling. Negative for dizziness, weakness and headaches.  Endo/Heme/Allergies:  Negative for environmental allergies. Does not bruise/bleed easily.  Psychiatric/Behavioral:  Negative for depression. The patient is not nervous/anxious and does not have insomnia.   All other systems reviewed and are negative.    Objective:  Physical Exam Vitals reviewed.  Constitutional:      General: She is not in acute distress.    Appearance: She is well-developed.  HENT:     Head: Normocephalic and atraumatic.     Mouth/Throat:     Pharynx: No oropharyngeal exudate.  Eyes:     Conjunctiva/sclera: Conjunctivae normal.     Pupils: Pupils are equal, round, and reactive to light.  Neck:     Vascular: No JVD.     Trachea: No tracheal deviation.     Comments: Loss of supraclavicular fat Cardiovascular:     Rate and Rhythm: Normal rate and regular rhythm.     Heart sounds: S1 normal and S2 normal.     Comments: Distant heart tones Pulmonary:     Effort: No tachypnea or accessory muscle usage.     Breath sounds: No stridor. Decreased breath sounds (throughout all lung fields) present. No wheezing, rhonchi or rales.     Comments: Diminshed breath sounds bilaterally  Abdominal:     General: Bowel sounds are normal. There is no distension.     Palpations: Abdomen  is soft.     Tenderness: There is no abdominal tenderness.  Musculoskeletal:        General: Deformity (muscle wasting ) present.  Skin:    General: Skin is warm and dry.     Capillary Refill: Capillary refill takes less than 2 seconds.     Findings: No rash.  Neurological:     Mental Status: She is alert and oriented to person, place, and time.  Psychiatric:        Behavior: Behavior normal.      Vitals:   11/01/21 1531  BP: 132/72  Pulse: 82  Temp: 98.4 F (36.9 C)  TempSrc: Oral  SpO2: 94%  Weight: 132 lb 3.2 oz (60 kg)  Height: _0  (1.727 m)   94% on 3 L pulse POC BMI Readings from Last 3 Encounters:  11/01/21 20.10 kg/m  10/26/21 19.99 kg/m  09/28/21 19.37 kg/m   Wt Readings from Last 3 Encounters:  11/01/21 132 lb 3.2 oz (60 kg)  10/26/21 131 lb 8 oz (59.6 kg)  09/28/21 127 lb 12.8 oz (58 kg)     CBC    Component Value Date/Time   WBC 5.7 07/01/2020 1430   WBC 5.7 03/05/2018 0327   RBC 4.56 07/01/2020 1430   RBC 4.61 03/05/2018 0327   HGB 13.6 07/01/2020 1430  HCT 41.2 07/01/2020 1430   PLT 192 07/01/2020 1430   MCV 90 07/01/2020 1430   MCH 29.8 07/01/2020 1430   MCH 30.4 03/05/2018 0327   MCHC 33.0 07/01/2020 1430   MCHC 31.5 03/05/2018 0327   RDW 13.1 07/01/2020 1430   LYMPHSABS 2.3 03/04/2018 2146   MONOABS 0.7 03/04/2018 2146   EOSABS 0.2 03/04/2018 2146   BASOSABS 0.1 03/04/2018 2146    Chest Imaging: 03/04/2018 chest x-ray: Emphysematous lung changes The patient's images have been independently reviewed by me.    Low-dose lung cancer screening CT completed in March 2020: Lung RADS 1, considered negative recommending annual 65-monthfollow-up.  This is already been ordered. Additionally there was some findings of bronchiectasis with no lower lobe evidence of mucoid impaction possible underlying atypical infection such as MAC.  The patient's images have been independently reviewed by me.    09/04/2019: Lung cancer screening  CT Lung RADS 1 - recommending 1 year follow-up evidence of emphysema and coronary artery disease. The patient's images have been independently reviewed by me.    April 2022 LDCT: No worrisome lesions. Lung rads 1  Repeat annual screening next year.  The patient's images have been independently reviewed by me.    April 2023 LDCT: Lung rads 2 Benign appearing, no worrisome lesions. The patient's images have been independently reviewed by me.    Pulmonary Functions Testing Results:    Latest Ref Rng & Units 03/30/2018    3:51 PM  PFT Results  FVC-Pre L 2.11   FVC-Predicted Pre % 100   FVC-Post L 2.31   FVC-Predicted Post % 109   Pre FEV1/FVC % % 41   Post FEV1/FCV % % 48   FEV1-Pre L 0.87   FEV1-Predicted Pre % 55   FEV1-Post L 1.10   DLCO uncorrected ml/min/mmHg 10.54   DLCO UNC% % 78   DLVA Predicted % 59   TLC L 8.24   TLC % Predicted % 213   RV % Predicted % 341    6MWT: SIX MIN WALK 06/06/2018  Medications Proair at 3pm, Aspirin 850mat 7:30, Vitamin D 2000 units, metoprolol 12.20m17mDulera 100, Spiriva 38m38mt 10am  Supplimental Oxygen during Test? (L/min) Yes  O2 Flow Rate 3  Type Continuous  Laps 9  Partial Lap (in Meters) 0  Baseline BP (sitting) 114/70  Baseline Heartrate 91  Baseline Dyspnea (Borg Scale) 3  Baseline Fatigue (Borg Scale) 3  Baseline SPO2 98  BP (sitting) 130/80  Heartrate 115  Dyspnea (Borg Scale) 9  Fatigue (Borg Scale) 4  SPO2 95  BP (sitting) 122/70  Heartrate 101  SPO2 98  Stopped or Paused before Six Minutes No  Distance Completed 306  Tech Comments: Pt walked at a normal pace without stopping during the walk. Pt denied any complaints.    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 Normal left ventricular filling  pressure. Low normal left ventricular filling pressure with subtle apical hypokinesis (LVEF 50-55%).    Assessment & Plan:     ICD-10-CM   1. Chronic respiratory failure with hypoxia (HCC)  J96.11     2. Protein-calorie malnutrition, severe  E43     3. Chronic obstructive pulmonary disease, unspecified COPD type (HCC)Rosebud44.9     4. Former smoker  Z87.891       Discussion:  This is a 71 year old female, mMRC 2, stage II COPD, chronic hypoxemic respiratory failure on 2 L nasal cannula, former smoker currently rolled her lung cancer screening program.  April 2023 scan lung RADS 2 with no concerning findings.  Recommending a yearly follow-up.  Plan: For COPD management with continue Breztri, continue albuterol as needed. Continue vitamin D supplementation Continue annual enrolled in lung cancer screening program for April 2024.  Patient to follow-up with [place] in 1 year or as needed.   Garner Nash, DO [facility] 11/01/2021 4:09 PM

## 2021-11-02 ENCOUNTER — Telehealth: Payer: Self-pay | Admitting: Neurology

## 2021-11-02 NOTE — Telephone Encounter (Signed)
error 

## 2021-11-02 NOTE — Telephone Encounter (Signed)
Pt called and LVM stating she is needing to speak to an RN or provider regarding her medications. No other information was left.

## 2021-11-03 ENCOUNTER — Telehealth: Payer: Self-pay | Admitting: Neurology

## 2021-11-03 NOTE — Telephone Encounter (Signed)
I called pt and spoke with her on this message. She sts she took the pregabalin for the first time on 11/01/2021 at bedtime. She sts the next day she woke up feeling groggy and short of breat (hx of COPD). She feels like the dosage is too strong for her and would like to try a lower dosage. Reports all day yesterday she felt foggy headed and fatigued.  She also reports the Crane worked well for her neuropathy and would like a prescription if available?

## 2021-11-03 NOTE — Telephone Encounter (Signed)
Pt has called to report that the pregabalin (LYRICA) 75 MG capsule, is too strong.  Pt is asking for a call to discuss a dose less than the 75mg .  Current dose makes pt groggy and affects breathing

## 2021-11-03 NOTE — Telephone Encounter (Signed)
Message handled in different telephone encounter ( see call from 11/02/2021)

## 2021-11-04 MED ORDER — ZTLIDO 1.8 % EX PTCH: 1.0000 | MEDICATED_PATCH | Freq: Every day | CUTANEOUS | 2 refills | Status: DC

## 2021-11-04 NOTE — Telephone Encounter (Signed)
If 25 mg is too much for her, she can just discontinue it.   Thanks

## 2021-11-04 NOTE — Telephone Encounter (Signed)
I called pt and spoke with her on this message. She sts she took the pregabalin for the first time on 11/01/2021 at bedtime. She sts the next day she woke up feeling groggy and short of breat (hx of COPD). She feels like the dosage is too strong for her and would like to try a lower dosage. Reports all day yesterday she felt foggy headed and fatigued.  She reports she does not think she will continue this medication or she can try at a lower dosage.

## 2021-11-04 NOTE — Telephone Encounter (Signed)
I spoke with Dr. Teresa Coombs verbally on this and he recommends the pt stop the 75 mg Prebablin at this time a purse Ztlido only.  Advised of pt of this and was in agreement. She will call next week if she has not heard from [facility]. She understand the pharmacy will have to order this med in.

## 2021-11-04 NOTE — Telephone Encounter (Signed)
Pt said, have stopped taking  pregabalin due to making feel like chest was tight, groggy all day Tuesday. Would like a call from the nurse.

## 2021-11-04 NOTE — Telephone Encounter (Signed)
Thank you, order signed

## 2021-11-08 ENCOUNTER — Telehealth: Payer: Self-pay | Admitting: Pulmonary Disease

## 2021-11-09 ENCOUNTER — Ambulatory Visit (INDEPENDENT_AMBULATORY_CARE_PROVIDER_SITE_OTHER): Payer: Medicare Other | Admitting: Nurse Practitioner

## 2021-11-09 ENCOUNTER — Telehealth: Payer: Self-pay | Admitting: *Deleted

## 2021-11-09 ENCOUNTER — Encounter: Payer: Self-pay | Admitting: Nurse Practitioner

## 2021-11-09 VITALS — BP 103/68 | HR 70 | Temp 97.6°F | Ht 68.11 in | Wt 133.4 lb

## 2021-11-09 DIAGNOSIS — F419 Anxiety disorder, unspecified: Secondary | ICD-10-CM

## 2021-11-09 DIAGNOSIS — G6181 Chronic inflammatory demyelinating polyneuritis: Secondary | ICD-10-CM

## 2021-11-09 DIAGNOSIS — Z0000 Encounter for general adult medical examination without abnormal findings: Secondary | ICD-10-CM

## 2021-11-09 DIAGNOSIS — M5441 Lumbago with sciatica, right side: Secondary | ICD-10-CM

## 2021-11-09 DIAGNOSIS — J449 Chronic obstructive pulmonary disease, unspecified: Secondary | ICD-10-CM | POA: Diagnosis not present

## 2021-11-09 DIAGNOSIS — G8929 Other chronic pain: Secondary | ICD-10-CM

## 2021-11-09 DIAGNOSIS — I2510 Atherosclerotic heart disease of native coronary artery without angina pectoris: Secondary | ICD-10-CM

## 2021-11-09 DIAGNOSIS — M5442 Lumbago with sciatica, left side: Secondary | ICD-10-CM

## 2021-11-09 NOTE — Telephone Encounter (Signed)
[id number] approved through 05/22/22.

## 2021-11-09 NOTE — Telephone Encounter (Signed)
PA for ZTlido 1.8% patches started on covermymeds (key: [id number]). Pharmacy coverage through [employer] [phone]). Decision pending.

## 2021-11-09 NOTE — Progress Notes (Unsigned)
Subjective:   Vickie Bowman is a 71 y.o. female who presents for Medicare Annual (Subsequent) preventive examination. -  Review of Systems    ***       Objective:    Today's Vitals   11/09/21 1328  BP: 103/68  Pulse: 70  Temp: 97.6 F (36.4 C)  SpO2: 98%  Weight: 133 lb 6.4 oz (60.5 kg)  Height: 5' 8.11" (1.73 m)   Body mass index is 20.22 kg/m.     03/05/2018    2:07 AM 03/04/2018    9:43 PM 02/01/2018   10:58 PM  Advanced Directives  Does Patient Have a Medical Advance Directive? No No No  Would patient like information on creating a medical advance directive? No - Patient declined No - Patient declined No - Patient declined    Current Medications (verified) Outpatient Encounter Medications as of 11/09/2021  Medication Sig   albuterol (PROVENTIL) (2.5 MG/3ML) 0.083% nebulizer solution Take 3 mLs (2.5 mg total) by nebulization every 6 (six) hours as needed for wheezing or shortness of breath.   ALPRAZolam (XANAX) 1 MG tablet Take 1 mg by mouth 2 (two) times daily.   aspirin EC 81 MG EC tablet Take 1 tablet (81 mg total) by mouth daily.   BREZTRI AEROSPHERE 160-9-4.8 MCG/ACT AERO Inhale 2 puffs into the lungs in the morning and at bedtime.   Cholecalciferol (VITAMIN D) 2000 units CAPS Take 1 capsule (2,000 Units total) by mouth daily.   DULoxetine (CYMBALTA) 60 MG capsule Take 1 capsule (60 mg total) by mouth daily.   HYDROcodone-acetaminophen (NORCO) 7.5-325 MG tablet Take 1 tablet by mouth 3 (three) times daily as needed.   ipratropium-albuterol (DUONEB) 0.5-2.5 (3) MG/3ML SOLN Take 3 mLs by nebulization every 6 (six) hours as needed.   levofloxacin (LEVAQUIN) 500 MG tablet Take 500 mg by mouth daily.   Lidocaine (ZTLIDO) 1.8 % PTCH Apply 1 patch topically daily.   meloxicam (MOBIC) 7.5 MG tablet Take 7.5 mg by mouth daily.   methylPREDNISolone (MEDROL) 4 MG TBPK tablet Take by mouth as directed for 6 days   metoprolol succinate (TOPROL-XL) 25 MG 24 hr tablet  Take 0.5 tablets (12.5 mg total) by mouth daily.   PROAIR HFA 108 (90 Base) MCG/ACT inhaler Inhale 2 puffs into the lungs 4 (four) times daily as needed for wheezing.   Spacer/Aero-Holding Chambers (AEROCHAMBER MV) inhaler Use as instructed   [DISCONTINUED] ciclopirox (PENLAC) 8 % solution Apply one coat to toenail qd x 48 weeks.  Remove weekly with polish remover. (Patient not taking: Reported on 11/09/2021)   [DISCONTINUED] famotidine (PEPCID) 20 MG tablet SMARTSIG:1 Tablet(s) By Mouth Every 12 Hours PRN (Patient not taking: Reported on 11/09/2021)   No facility-administered encounter medications on file as of 11/09/2021.    Allergies (verified) Penicillins   History: Past Medical History:  Diagnosis Date   Anxiety    CHF (congestive heart failure) (HCC)    COPD (chronic obstructive pulmonary disease) (HCC)    Lumbar herniated disc    Tobacco abuse    Past Surgical History:  Procedure Laterality Date   LEFT HEART CATH AND CORONARY ANGIOGRAPHY N/A 02/05/2018   Procedure: LEFT HEART CATH AND CORONARY ANGIOGRAPHY;  Surgeon: Yvonne Kendall, MD;  Location: [facility];  Service: Cardiovascular;  Laterality: N/A;   Family History  Problem Relation Age of Onset   Liver cancer Mother    Breast cancer Sister    CAD Neg Hx    Social History  Socioeconomic History   Marital status: Widowed    Spouse name: Not on file   Number of children: Not on file   Years of education: Not on file   Highest education level: Not on file  Occupational History   Not on file  Tobacco Use   Smoking status: Former    Packs/day: 1.50    Years: 53.00    Total pack years: 79.50    Types: Cigarettes    Start date: 05/24/1967    Quit date: 01/07/2018    Years since quitting: 3.8   Smokeless tobacco: Never   Tobacco comments:    Smoked since age 38, quit 12/2017  Vaping Use   Vaping Use: Never used  Substance and Sexual Activity   Alcohol use: Yes    Comment: Rare, glass of wine no more than  every few weeks   Drug use: Never   Sexual activity: Not Currently  Other Topics Concern   Not on file  Social History Narrative   Not on file   Social Determinants of Health   Financial Resource Strain: Not on file  Food Insecurity: Not on file  Transportation Needs: Not on file  Physical Activity: Not on file  Stress: Not on file  Social Connections: Not on file    Tobacco Counseling Counseling given: Not Answered Tobacco comments: Smoked since age 67, quit 12/2017   Diabetic?no     Activities of Daily Living    09/28/2021    2:30 PM  In your present state of health, do you have any difficulty performing the following activities:  Hearing? 0  Vision? 0  Difficulty concentrating or making decisions? 0  Walking or climbing stairs? 1  Dressing or bathing? 0  Doing errands, shopping? 0    Patient Care Team: Ronnell Freshwater, NP as PCP - General (Family Medicine) Debara Pickett Nadean Corwin, MD as PCP - Cardiology (Cardiology)  Indicate any recent Medical Services you may have received from other than [facility] providers in the past year (date may be approximate).     Assessment:   This is a routine wellness examination for Vickie Bowman.  Hearing/Vision screen No results found.  Dietary issues and exercise activities discussed:     Goals Addressed   None   Depression Screen    11/09/2021    1:29 PM 09/28/2021    2:30 PM  PHQ 2/9 Scores  PHQ - 2 Score 0 0  PHQ- 9 Score 2 1    Fall Risk     No data to display          FALL RISK PREVENTION PERTAINING TO THE HOME:  Any stairs in or around the home? No  If so, are there any without handrails? No  Home free of loose throw rugs in walkways, pet beds, electrical cords, etc? Yes  Adequate lighting in your home to reduce risk of falls? Yes   ASSISTIVE DEVICES UTILIZED TO PREVENT FALLS:  Life alert? No  Use of a cane, walker or w/c? No  Grab bars in the bathroom? No  Shower chair or bench in shower? No  Elevated toilet  seat or a handicapped toilet? No   TIMED UP AND GO:  Was the test performed? Yes .  Length of time to ambulate 10 feet: 10 sec.   Gait steady and fast without use of assistive device  Cognitive Function:        11/09/2021    1:37 PM  6CIT Screen  What Year? 0  points  What month? 0 points  What time? 0 points  Count back from 20 0 points  Months in reverse 0 points  Repeat phrase 2 points  Total Score 2 points    Immunizations Immunization History  Administered Date(s) Administered   Fluad Quad(high Dose 65+) 02/22/2019, 03/19/2021   Influenza-Unspecified 03/21/2018   PFIZER(Purple Top)SARS-COV-2 Vaccination 11/23/2019, 11/30/2019   Pneumococcal Polysaccharide-23 06/21/2018    TDAP status: Due, Education has been provided regarding the importance of this vaccine. Advised may receive this vaccine at local pharmacy or Health Dept. Aware to provide a copy of the vaccination record if obtained from local pharmacy or Health Dept. Verbalized acceptance and understanding.  Flu Vaccine status: Due, Education has been provided regarding the importance of this vaccine. Advised may receive this vaccine at local pharmacy or Health Dept. Aware to provide a copy of the vaccination record if obtained from local pharmacy or Health Dept. Verbalized acceptance and understanding.  Pneumococcal vaccine status: Up to date  Covid-19 vaccine status: Completed vaccines  Qualifies for Shingles Vaccine? Yes   Zostavax completed No   Shingrix Completed?: No.    Education has been provided regarding the importance of this vaccine. Patient has been advised to call insurance company to determine out of pocket expense if they have not yet received this vaccine. Advised may also receive vaccine at local pharmacy or Health Dept. Verbalized acceptance and understanding.  Screening Tests Health Maintenance  Topic Date Due   Hepatitis C Screening  Never done   TETANUS/TDAP  Never done   Fecal DNA  (Cologuard)  Never done   Zoster Vaccines- Shingrix (1 of 2) Never done   COVID-19 Vaccine (3 - Pfizer series) 01/25/2020   Pneumonia Vaccine 34+ Years old (2 - PCV) 09/29/2022 (Originally 06/22/2019)   MAMMOGRAM  09/29/2022 (Originally 06/19/1998)   INFLUENZA VACCINE  12/21/2021   DEXA SCAN  Completed   HPV VACCINES  Aged Out    Health Maintenance  Health Maintenance Due  Topic Date Due   Hepatitis C Screening  Never done   TETANUS/TDAP  Never done   Fecal DNA (Cologuard)  Never done   Zoster Vaccines- Shingrix (1 of 2) Never done   COVID-19 Vaccine (3 - Pfizer series) 01/25/2020    Colorectal cancer screening: Type of screening: Cologuard. Completed ***. Repeat every *** years  {Mammogram status:21018020}  Bone Density status: Completed 06/2018. Results reflect: Bone density results: OSTEOPOROSIS. Repeat every n/a years.  Lung Cancer Screening: (Low Dose CT Chest recommended if Age 48-80 years, 30 pack-year currently smoking OR have quit w/in 15years.) does qualify.   Lung Cancer Screening Referral: n/a  Additional Screening:  Hepatitis C Screening: does not qualify; Completed n/a  Vision Screening: Recommended annual ophthalmology exams for early detection of glaucoma and other disorders of the eye. Is the patient up to date with their annual eye exam?  Yes  Who is the provider or what is the name of the office in which the patient attends annual eye exams? Dr Crawford Givens If pt is not established with a provider, would they like to be referred to a provider to establish care? No .   Dental Screening: Recommended annual dental exams for proper oral hygiene  Community Resource Referral / Chronic Care Management: CRR required this visit?  No   CCM required this visit?  No      Plan:     I have personally reviewed and noted the following in the patient's chart:   Medical and  social history Use of alcohol, tobacco or illicit drugs  Current medications and supplements  including opioid prescriptions.  Functional ability and status Nutritional status Physical activity Advanced directives List of other physicians Hospitalizations, surgeries, and ER visits in previous 12 months Vitals Screenings to include cognitive, depression, and falls Referrals and appointments  In addition, I have reviewed and discussed with patient certain preventive protocols, quality metrics, and best practice recommendations. A written personalized care plan for preventive services as well as general preventive health recommendations were provided to patient.       11/09/2021   Nurse Notes: ***

## 2021-11-10 ENCOUNTER — Other Ambulatory Visit: Payer: Self-pay | Admitting: Acute Care

## 2021-11-10 DIAGNOSIS — J441 Chronic obstructive pulmonary disease with (acute) exacerbation: Secondary | ICD-10-CM

## 2021-11-10 MED ORDER — ALBUTEROL SULFATE HFA 108 (90 BASE) MCG/ACT IN AERS: 2.0000 | INHALATION_SPRAY | Freq: Four times a day (QID) | RESPIRATORY_TRACT | 2 refills | Status: DC | PRN

## 2021-11-10 NOTE — Telephone Encounter (Signed)
Patient called and states they no longer make [employer]. Needs replacement for [employer]. Pharmacy is [facility].   Please advise

## 2021-11-11 LAB — COLOGUARD: COLOGUARD: NEGATIVE

## 2021-11-11 NOTE — Telephone Encounter (Signed)
Called and spoke with pharmacy to make sure they got RX from Vickie Bowman and that there was no issues. They said Albuterol was covered and is ready for pick up. Called and spoke with patient about above information she expressed understanding. Nothing further needed at this time.

## 2021-11-14 ENCOUNTER — Other Ambulatory Visit: Payer: Self-pay | Admitting: Neurology

## 2021-11-14 DIAGNOSIS — G6181 Chronic inflammatory demyelinating polyneuritis: Secondary | ICD-10-CM | POA: Insufficient documentation

## 2021-11-16 ENCOUNTER — Other Ambulatory Visit: Payer: Self-pay

## 2021-11-16 NOTE — Telephone Encounter (Signed)
Rx refilled.

## 2021-11-19 ENCOUNTER — Encounter (HOSPITAL_BASED_OUTPATIENT_CLINIC_OR_DEPARTMENT_OTHER): Payer: Self-pay | Admitting: Internal Medicine

## 2021-11-19 ENCOUNTER — Ambulatory Visit (INDEPENDENT_AMBULATORY_CARE_PROVIDER_SITE_OTHER): Payer: Medicare Other | Admitting: Internal Medicine

## 2021-11-19 VITALS — BP 116/80 | HR 66 | Ht 68.0 in | Wt 133.8 lb

## 2021-11-19 DIAGNOSIS — I2510 Atherosclerotic heart disease of native coronary artery without angina pectoris: Secondary | ICD-10-CM | POA: Diagnosis not present

## 2021-11-19 DIAGNOSIS — J449 Chronic obstructive pulmonary disease, unspecified: Secondary | ICD-10-CM

## 2021-11-19 DIAGNOSIS — K719 Toxic liver disease, unspecified: Secondary | ICD-10-CM

## 2021-11-19 DIAGNOSIS — E782 Mixed hyperlipidemia: Secondary | ICD-10-CM | POA: Diagnosis not present

## 2021-11-19 DIAGNOSIS — T466X5A Adverse effect of antihyperlipidemic and antiarteriosclerotic drugs, initial encounter: Secondary | ICD-10-CM

## 2021-11-19 MED ORDER — ROSUVASTATIN CALCIUM 20 MG PO TABS: 20.0000 mg | ORAL_TABLET | Freq: Every day | ORAL | 3 refills | Status: DC

## 2021-11-19 NOTE — Patient Instructions (Signed)
Medication Instructions:  RESUME crestor 20mg  daily for cholesterol  CONTINUE all other current medications  *If you need a refill on your cardiac medications before your next appointment, please call your pharmacy*   Lab Work: FASTING lab work in October 2023 to check cholesterol   If you have labs (blood work) drawn today and your tests are completely normal, you will receive your results only by: MyChart Message (if you have MyChart) OR A paper copy in the mail If you have any lab test that is abnormal or we need to change your treatment, we will call you to review the results.   Testing/Procedures: NONE   Follow-Up: At [facility], you and your health needs are our priority.  As part of our continuing mission to provide you with exceptional heart care, we have created designated Provider Care Teams.  These Care Teams include your primary Cardiologist (physician) and Advanced Practice Providers (APPs -  Physician Assistants and Nurse Practitioners) who all work together to provide you with the care you need, when you need it.  We recommend signing up for the patient portal called "MyChart".  Sign up information is provided on this After Visit Summary.  MyChart is used to connect with patients for Virtual Visits (Telemedicine).  Patients are able to view lab/test results, encounter notes, upcoming appointments, etc.  Non-urgent messages can be sent to your provider as well.   To learn more about what you can do with MyChart, go to [facility].    Your next appointment:   12 month(s)  The format for your next appointment:   In Person  Provider:   [email or web address], MD {

## 2021-11-19 NOTE — Progress Notes (Signed)
OFFICE NOTE  Chief Complaint:  Follow-up  Primary Care Physician: Carlean Jews, NP  HPI:  Vickie Bowman is a 71 y.o. female with a past medial history significant for hospitalization in September for acute dyspnea.  She was admitted for COPD exacerbation was found to have elevated troponin of 1.55.  Cardiology was consulted and echo showed an EF 35 to 40%.  I recommended heart catheterization for her.  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  LVEF at the time was up to 50 to 55%.  Was subsequently seen by Azalee Course, PA-C, and follow-up and a limited echo was ordered.  She was noted to be on atorvastatin 40 mg daily.  A fasting lipid profile LFTs were also ordered.  Recently the labs resulted and she did have marked improvement in her lipid profile with a total cholesterol 143 and LDL of 52, decreased from 240.  Unfortunately, she had an acute rise in AST and ALT to 97 and 185 respectively from 32 and 25.  This suggest there may be underlying steatohepatitis.  She remains asymptomatic with respect to that.  She is followed in pulmonary has an upcoming appointment in 2 weeks.  She is noted to be on oxygen.  Otherwise besides shortness of breath denies any chest pain.  06/17/2019  Vickie Bowman returns today for follow-up.  Her main issues continue to be with COPD.  She is on oxygen but has been off of cigarettes for about 2 years.  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  I advised long-term beta-blocker as well as low-dose aspirin.  She did have some coronary disease and was on atorvastatin however had elevated liver enzymes.  Based on that it was discontinued.  Since then her cholesterol apparently is gone up however I do not have access to those records at this time.  We will request those from her primary care provider.  07/02/2019  Vickie Bowman is seen today in follow-up.  Unfortunately she had COVID-19  in November 2021.  Since then she says she has been a little more fatigued and short of breath.  She says she tires real easily as well.  She is on continuous oxygen.  She uses a finger oximeter at home and her oxygen levels have been over 90%.  She is also been having some leg pains and achiness bilaterally.  She is not sure what this could be related to however I offered it might be due to her statin.  She had previously taken atorvastatin but had issues with liver enzyme elevation.  Rosuvastatin is not cause that issue but it could be causing her pain.  We talked about a 2-week statin holiday to see if this resolves.  As mentioned above her last echo in 2020 showed normalization of her LVEF however she has a history of cardiomyopathy in the past.  11/19/2021  Vickie Bowman returns today for follow-up.  Overall she is doing well although continues to have shortness of breath on oxygen with her COPD.  She has maintained her weight fairly well but remains in the low end of normal.  She is due for repeat lipids.  She had called in saying that she was having issues with leg pain.  We advised her to stop her statin and contact us after 2 weeks to see if her symptoms had resolved.  Apparently they did not change but she never went back on her  medicine.  She was on statin 20 mg daily.  Blood pressure is well controlled today.  She denies any chest pain or worsening shortness of breath.  PMHx:  Past Medical History:  Diagnosis Date   Anxiety    CHF (congestive heart failure) (HCC)    COPD (chronic obstructive pulmonary disease) (HCC)    Lumbar herniated disc    Tobacco abuse     Past Surgical History:  Procedure Laterality Date   LEFT HEART CATH AND CORONARY ANGIOGRAPHY N/A 02/05/2018   Procedure: LEFT HEART CATH AND CORONARY ANGIOGRAPHY;  Surgeon: Yvonne Kendall, MD;  Location: [facility];  Service: Cardiovascular;  Laterality: N/A;    FAMHx:  Family History  Problem Relation Age of Onset    Liver cancer Mother    Breast cancer Sister    CAD Neg Hx     SOCHx:   reports that she quit smoking about 3 years ago. Her smoking use included cigarettes. She started smoking about 54 years ago. She has a 79.50 pack-year smoking history. She has never used smokeless tobacco. She reports current alcohol use. She reports that she does not use drugs.  ALLERGIES:  Allergies  Allergen Reactions   Penicillins Swelling    Has patient had a PCN reaction causing immediate rash, facial/tongue/throat swelling, SOB or lightheadedness with hypotension: Yes Has patient had a PCN reaction causing severe rash involving mucus membranes or skin necrosis: No Has patient had a PCN reaction that required hospitalization: No Has patient had a PCN reaction occurring within the last 10 years: No If all of the above answers are "NO", then may proceed with Cephalosporin use.     ROS: Pertinent items noted in HPI and remainder of comprehensive ROS otherwise negative.  HOME MEDS: Current Outpatient Medications on File Prior to Visit  Medication Sig Dispense Refill   albuterol (PROVENTIL) (2.5 MG/3ML) 0.083% nebulizer solution Take 3 mLs (2.5 mg total) by nebulization every 6 (six) hours as needed for wheezing or shortness of breath. 75 mL 12   albuterol (VENTOLIN HFA) 108 (90 Base) MCG/ACT inhaler Inhale 2 puffs into the lungs every 6 (six) hours as needed for wheezing or shortness of breath. 8 g 2   ALPRAZolam (XANAX) 1 MG tablet Take 1 mg by mouth 2 (two) times daily.     aspirin EC 81 MG EC tablet Take 1 tablet (81 mg total) by mouth daily. 30 tablet 0   BREZTRI AEROSPHERE 160-9-4.8 MCG/ACT AERO Inhale 2 puffs into the lungs in the morning and at bedtime. 10.7 g 11   Cholecalciferol (VITAMIN D) 2000 units CAPS Take 1 capsule (2,000 Units total) by mouth daily. 30 capsule 5   DULoxetine (CYMBALTA) 60 MG capsule Take 1 capsule by mouth once daily 30 capsule 0   HYDROcodone-acetaminophen (NORCO) 7.5-325 MG  tablet Take 1 tablet by mouth 3 (three) times daily as needed.     ipratropium-albuterol (DUONEB) 0.5-2.5 (3) MG/3ML SOLN Take 3 mLs by nebulization every 6 (six) hours as needed. 90 mL 3   levofloxacin (LEVAQUIN) 500 MG tablet Take 500 mg by mouth daily.     Lidocaine (ZTLIDO) 1.8 % PTCH Apply 1 patch topically daily. 30 patch 2   meloxicam (MOBIC) 7.5 MG tablet Take 7.5 mg by mouth daily.     methylPREDNISolone (MEDROL) 4 MG TBPK tablet Take by mouth as directed for 6 days 21 tablet 0   metoprolol succinate (TOPROL-XL) 25 MG 24 hr tablet Take 0.5 tablets (12.5 mg total) by  mouth daily. 60 tablet 0   Spacer/Aero-Holding Chambers (AEROCHAMBER MV) inhaler Use as instructed 1 each 0   No current facility-administered medications on file prior to visit.    LABS/IMAGING: No results found for this or any previous visit (from the past 48 hour(s)). No results found.  LIPID PANEL:    Component Value Date/Time   CHOL 129 07/01/2020 1430   TRIG 86 07/01/2020 1430   HDL 59 07/01/2020 1430   CHOLHDL 2.2 07/01/2020 1430   CHOLHDL 3.0 02/02/2018 0624   VLDL 9 02/02/2018 0624   LDLCALC 53 07/01/2020 1430     WEIGHTS: Wt Readings from Last 3 Encounters:  11/19/21 133 lb 12.8 oz (60.7 kg)  11/09/21 133 lb 6.4 oz (60.5 kg)  11/01/21 132 lb 3.2 oz (60 kg)    VITALS: Ht 5\' 8"  (1.727 m)   Wt 133 lb 12.8 oz (60.7 kg)   SpO2 98%   BMI 20.34 kg/m   EXAM: General appearance: alert, no distress and On oxygen Neck: no carotid bruit, no JVD and thyroid not enlarged, symmetric, no tenderness/mass/nodules Lungs: diminished breath sounds bilaterally and rhonchi bilaterally Heart: regular rate and rhythm, S1, S2 normal, no murmur, click, rub or gallop Abdomen: soft, non-tender; bowel sounds normal; no masses,  no organomegaly and Scaphoid Extremities: extremities normal, atraumatic, no cyanosis or edema Pulses: 2+ and symmetric Skin: Skin color, texture, turgor normal. No rashes or  lesions Neurologic: Grossly normal : Pleasant  EKG: Normal sinus rhythm at 64-personally reviewed  ASSESSMENT: COPD on oxygen  History of COVID-19 infection (03/2020) History of acute systolic congestive heart failure, LVEF 35 to 40%-improved to 55 to 60% (05/2018) Mild nonobstructive coronary disease (01/2018) Elevated LFTs on statin  PLAN: 1.   Vickie Bowman stopped her statin because of leg pain but is noted to have peripheral neuropathy.  Her symptoms did not change but she did not notify 02-22-1974 and she has been off of her statin for quite a while.  Her lipids have been well controlled and her liver enzymes were better on rosuvastatin 20 mg daily.  I will prescribe her to restart that medicine we will plan repeat lipids and liver enzymes in about 3 months.  Follow-up with me otherwise annually or sooner as necessary.  [place], MD, [facility], FACP  [place]  [facility]  Medical Director of the [facility] Lipid [facility] &  Cardiovascular Risk Reduction Clinic Diplomate of the American Board of Clinical Lipidology Attending Cardiologist  Direct Dial: [phone]  Fax: [phone]  Website:  [email or web address] Naseem Varden 11/19/2021, 1:54 PM

## 2021-11-23 NOTE — Progress Notes (Signed)
Established patient visit   Patient: Vickie Bowman   DOB: 1951-04-09   71 y.o. Female  MRN: [id number] Visit Date: 11/24/2021   Chief Complaint  Patient presents with   Follow-up   Subjective    HPI  Follow up -chronic pain. --referred to pain management provider. Referral has been approved but needs to be scheduled.  --discussing back pain medication as she was on very high and frequent dosing of narcotic pain medication  --also on very high and frequent dosing of benzodiazepines.  --had discussed the need for pain management and need to cut back the use of both medications due to the long term effects of these medications which included dependency.  -states that she took two of her pain pills last week and has not needed to take any at all this week. She was referred to pain management. She has not made initial appointment. She is willing to taper off hydrocodone gradually. Would like to come off of this medication completely.  -she does continue to take alprazolam in the morning. Sometimes she does need to take one at night.  -cologuard test did come back since she was last seen. Results were negative.  Repeat in three years.  -cardiologist did start her back on rosuvastatin --has restarted deep, productive cough which is much worse at night . In the past, medrol taper has helped. She does see pulmonology    Medications: Outpatient Medications Prior to Visit  Medication Sig   albuterol (PROVENTIL) (2.5 MG/3ML) 0.083% nebulizer solution Take 3 mLs (2.5 mg total) by nebulization every 6 (six) hours as needed for wheezing or shortness of breath.   albuterol (VENTOLIN HFA) 108 (90 Base) MCG/ACT inhaler Inhale 2 puffs into the lungs every 6 (six) hours as needed for wheezing or shortness of breath.   aspirin EC 81 MG EC tablet Take 1 tablet (81 mg total) by mouth daily.   BREZTRI AEROSPHERE 160-9-4.8 MCG/ACT AERO Inhale 2 puffs into the lungs in the morning and at bedtime.    Cholecalciferol (VITAMIN D) 2000 units CAPS Take 1 capsule (2,000 Units total) by mouth daily.   DULoxetine (CYMBALTA) 60 MG capsule Take 1 capsule by mouth once daily   ipratropium-albuterol (DUONEB) 0.5-2.5 (3) MG/3ML SOLN Take 3 mLs by nebulization every 6 (six) hours as needed.   levofloxacin (LEVAQUIN) 500 MG tablet Take 500 mg by mouth daily.   Lidocaine (ZTLIDO) 1.8 % PTCH Apply 1 patch topically daily.   metoprolol succinate (TOPROL-XL) 25 MG 24 hr tablet Take 0.5 tablets (12.5 mg total) by mouth daily.   rosuvastatin (CRESTOR) 20 MG tablet Take 1 tablet (20 mg total) by mouth daily.   Spacer/Aero-Holding Chambers (AEROCHAMBER MV) inhaler Use as instructed   [DISCONTINUED] ALPRAZolam (XANAX) 1 MG tablet Take 1 mg by mouth 2 (two) times daily.   [DISCONTINUED] HYDROcodone-acetaminophen (NORCO) 7.5-325 MG tablet Take 1 tablet by mouth 3 (three) times daily as needed.   [DISCONTINUED] meloxicam (MOBIC) 7.5 MG tablet Take 7.5 mg by mouth daily.   [DISCONTINUED] methylPREDNISolone (MEDROL) 4 MG TBPK tablet Take by mouth as directed for 6 days   No facility-administered medications prior to visit.    Review of Systems  Constitutional:  Positive for fatigue. Negative for activity change, appetite change, chills and fever.  HENT:  Negative for congestion, postnasal drip, rhinorrhea, sinus pressure, sinus pain, sneezing and sore throat.   Eyes: Negative.   Respiratory:  Positive for shortness of breath and wheezing. Negative for cough and chest tightness.  Cardiovascular:  Negative for chest pain and palpitations.  Gastrointestinal:  Negative for abdominal pain, constipation, diarrhea, nausea and vomiting.  Endocrine: Negative for cold intolerance, heat intolerance, polydipsia and polyuria.  Genitourinary:  Negative for dyspareunia, dysuria, flank pain, frequency and urgency.  Musculoskeletal:  Positive for arthralgias, back pain and myalgias.  Skin:  Negative for rash.   Allergic/Immunologic: Negative for environmental allergies.  Neurological:  Positive for headaches. Negative for dizziness and weakness.  Hematological:  Negative for adenopathy.  Psychiatric/Behavioral:  The patient is nervous/anxious.        Objective     Today's Vitals   11/24/21 1504  BP: 105/66  Pulse: 74  SpO2: 98%  Weight: 133 lb 12.8 oz (60.7 kg)  Height: 5' 8.11" (1.73 m)   Body mass index is 20.28 kg/m.   BP Readings from Last 3 Encounters:  11/24/21 105/66  11/19/21 116/80  11/09/21 103/68    Wt Readings from Last 3 Encounters:  11/24/21 133 lb 12.8 oz (60.7 kg)  11/19/21 133 lb 12.8 oz (60.7 kg)  11/09/21 133 lb 6.4 oz (60.5 kg)    Physical Exam Vitals and nursing note reviewed.  Constitutional:      Appearance: Normal appearance. She is well-developed.  HENT:     Head: Normocephalic and atraumatic.  Eyes:     Pupils: Pupils are equal, round, and reactive to light.  Cardiovascular:     Rate and Rhythm: Normal rate and regular rhythm.     Pulses: Normal pulses.     Heart sounds: Normal heart sounds.  Pulmonary:     Effort: Pulmonary effort is normal.     Breath sounds: Normal breath sounds.     Comments: Patient using nasal cannula oxygen  at  liters per minute.  Breath sounds are clear but diminished bilaterally  Abdominal:     Palpations: Abdomen is soft.  Musculoskeletal:        General: Normal range of motion.     Cervical back: Normal range of motion and neck supple.     Comments:  Chronic lower back pain. Worse with movement.    Lymphadenopathy:     Cervical: No cervical adenopathy.  Skin:    General: Skin is warm and dry.     Capillary Refill: Capillary refill takes less than 2 seconds.  Neurological:     General: No focal deficit present.     Mental Status: She is alert and oriented to person, place, and time.  Psychiatric:        Mood and Affect: Mood normal.        Behavior: Behavior normal.        Thought Content: Thought  content normal.        Judgment: Judgment normal.      Assessment & Plan    1. Chronic midline low back pain with bilateral sciatica Discussed need to cut back narcotics dosing and frequency.  Patient voiced understanding of this requirement.  She has already tried to cut back dosing at home.  Is now using 1 tablet hydrocodone/APAP 1 time, maybe 2 times daily, and only if needed.  A single prescription for 60 tablets sent to her pharmacy.  We have discussed the plan to continue cutting back dosing.  She has been referred to pain management that has expressed desire to not be referred.  She did sign a controlled substances agreement policy today.  We will reassess in 6 weeks. - HYDROcodone-acetaminophen (NORCO) 7.5-325 MG tablet; Take 1 tablet by  mouth 2 (two) times daily as needed.  Dispense: 60 tablet; Refill: 0  2. Generalized anxiety disorder Continue duloxetine as previously prescribed.  For now, may continue alprazolam 1 mg up to twice daily as needed for acute anxiety.  Reviewed plan to gradually wean this dose in near future.  Patient is in agreement with the plan. - ALPRAZolam (XANAX) 1 MG tablet; Take 1 tablet (1 mg total) by mouth 2 (two) times daily.  Dispense: 60 tablet; Refill: 0  3. COPD with exacerbation (HCC) Medrol taper.  Take as directed for 6 days.  Continue inhalers and respiratory medications as previously prescribed.  Follow-up with pulmonology as scheduled. - methylPREDNISolone (MEDROL) 4 MG TBPK tablet; Take by mouth as directed for 6 days  Dispense: 21 tablet; Refill: 0  4. Coronary artery disease involving native coronary artery of native heart without angina pectoris Stable.  Continue regular visits with cardiology as scheduled.  5. Need for Tdap vaccination Tdap vaccine administered during today's visit.   - Tdap vaccine greater than or equal to 7yo IM    Problem List Items Addressed This Visit       Cardiovascular and Mediastinum   Coronary artery disease  involving native heart without angina pectoris     Respiratory   COPD with exacerbation (HCC)   Relevant Medications   methylPREDNISolone (MEDROL) 4 MG TBPK tablet     Nervous and Auditory   Chronic midline low back pain with bilateral sciatica - Primary   Relevant Medications   methylPREDNISolone (MEDROL) 4 MG TBPK tablet   ALPRAZolam (XANAX) 1 MG tablet   HYDROcodone-acetaminophen (NORCO) 7.5-325 MG tablet     Other   Generalized anxiety disorder   Relevant Medications   ALPRAZolam (XANAX) 1 MG tablet   Other Visit Diagnoses     Need for Tdap vaccination       Relevant Orders   Tdap vaccine greater than or equal to 7yo IM (Completed)        Return in about 6 weeks (around 01/05/2022) for mood, pain .         Carlean Jews, NP  [facility] at [facility] [phone] (phone) [phone] (fax)  [facility]

## 2021-11-24 ENCOUNTER — Ambulatory Visit (INDEPENDENT_AMBULATORY_CARE_PROVIDER_SITE_OTHER): Payer: Medicare Other | Admitting: Nurse Practitioner

## 2021-11-24 ENCOUNTER — Encounter: Payer: Self-pay | Admitting: Nurse Practitioner

## 2021-11-24 VITALS — BP 105/66 | HR 74 | Ht 68.11 in | Wt 133.8 lb

## 2021-11-24 DIAGNOSIS — F411 Generalized anxiety disorder: Secondary | ICD-10-CM | POA: Diagnosis not present

## 2021-11-24 DIAGNOSIS — I2510 Atherosclerotic heart disease of native coronary artery without angina pectoris: Secondary | ICD-10-CM | POA: Diagnosis not present

## 2021-11-24 DIAGNOSIS — J441 Chronic obstructive pulmonary disease with (acute) exacerbation: Secondary | ICD-10-CM | POA: Diagnosis not present

## 2021-11-24 DIAGNOSIS — M5441 Lumbago with sciatica, right side: Secondary | ICD-10-CM | POA: Diagnosis not present

## 2021-11-24 DIAGNOSIS — Z23 Encounter for immunization: Secondary | ICD-10-CM | POA: Diagnosis not present

## 2021-11-24 DIAGNOSIS — M5442 Lumbago with sciatica, left side: Secondary | ICD-10-CM

## 2021-11-24 DIAGNOSIS — G8929 Other chronic pain: Secondary | ICD-10-CM

## 2021-11-24 MED ORDER — METHYLPREDNISOLONE 4 MG PO TBPK: ORAL_TABLET | ORAL | 0 refills | Status: DC

## 2021-11-24 MED ORDER — ALPRAZOLAM 1 MG PO TABS: 1.0000 mg | ORAL_TABLET | Freq: Two times a day (BID) | ORAL | 0 refills | Status: DC

## 2021-11-24 MED ORDER — HYDROCODONE-ACETAMINOPHEN 7.5-325 MG PO TABS: 1.0000 | ORAL_TABLET | Freq: Two times a day (BID) | ORAL | 0 refills | Status: DC | PRN

## 2021-11-25 ENCOUNTER — Other Ambulatory Visit: Payer: Self-pay

## 2021-11-25 MED ORDER — MELOXICAM 7.5 MG PO TABS: 7.5000 mg | ORAL_TABLET | Freq: Every day | ORAL | 0 refills | Status: DC

## 2021-12-02 ENCOUNTER — Telehealth: Payer: Self-pay | Admitting: Nurse Practitioner

## 2021-12-05 DIAGNOSIS — F411 Generalized anxiety disorder: Secondary | ICD-10-CM | POA: Insufficient documentation

## 2021-12-08 ENCOUNTER — Telehealth: Payer: Self-pay | Admitting: Nurse Practitioner

## 2021-12-08 NOTE — Telephone Encounter (Signed)
Patient is stating she still has a terrible cough and wants to know if you can send her in anything or recommend anything? Please advise.

## 2021-12-08 NOTE — Telephone Encounter (Signed)
Patient aware.

## 2021-12-15 ENCOUNTER — Other Ambulatory Visit: Payer: Self-pay | Admitting: Neurology

## 2021-12-21 ENCOUNTER — Encounter: Payer: Self-pay | Admitting: Podiatry

## 2021-12-21 ENCOUNTER — Ambulatory Visit (INDEPENDENT_AMBULATORY_CARE_PROVIDER_SITE_OTHER): Payer: Medicare Other | Admitting: Podiatry

## 2021-12-21 DIAGNOSIS — M79675 Pain in left toe(s): Secondary | ICD-10-CM | POA: Diagnosis not present

## 2021-12-21 DIAGNOSIS — M79674 Pain in right toe(s): Secondary | ICD-10-CM

## 2021-12-21 DIAGNOSIS — B351 Tinea unguium: Secondary | ICD-10-CM | POA: Diagnosis not present

## 2021-12-21 DIAGNOSIS — G629 Polyneuropathy, unspecified: Secondary | ICD-10-CM

## 2021-12-22 ENCOUNTER — Telehealth: Payer: Self-pay | Admitting: Neurology

## 2021-12-22 NOTE — Telephone Encounter (Signed)
Pt states she has read that while taking DULoxetine (CYMBALTA) 60 MG capsule, if urine is dark to call.  Pt states her urine has been dark for about a week.  Pt states she will not take the pill today, she is asking for a call.

## 2021-12-22 NOTE — Telephone Encounter (Signed)
She was started on duloxetine 60mg  in November 2022.   I called the patient who tells me her urine has been dark for one week. Denies fever, chills. However, she has noticed some burning with urination. Instructed her to contact PCP to rule out UTI. She is agreeable to this plan.

## 2021-12-25 NOTE — Progress Notes (Signed)
  Subjective:  Patient ID: Vickie Bowman, female    DOB: Feb 09, 1951,  MRN: [id number]  Vickie Bowman presents to clinic today for at risk foot care with history of peripheral neuropathy and painful elongated mycotic toenails 1-5 bilaterally which are tender when wearing enclosed shoe gear. Pain is relieved with periodic professional debridement.  New problem(s): None.   PCP is Carlean Jews, NP , and last visit was  November 24, 2021  Allergies  Allergen Reactions   Penicillins Swelling    Has patient had a PCN reaction causing immediate rash, facial/tongue/throat swelling, SOB or lightheadedness with hypotension: Yes Has patient had a PCN reaction causing severe rash involving mucus membranes or skin necrosis: No Has patient had a PCN reaction that required hospitalization: No Has patient had a PCN reaction occurring within the last 10 years: No If all of the above answers are "NO", then may proceed with Cephalosporin use.     Review of Systems: Negative except as noted in the HPI.  Objective: No changes noted in today's physical examination.  Constitutional Vickie Bowman is a pleasant 71 y.o. Caucasian female, thin build in NAD. AAO x 3. Patient is on portable oxygen concentrator in office.  Vascular CFT <3 seconds b/l LE. Palpable DP/PT pulses b/l LE. Digital hair sparse b/l. Skin temperature gradient WNL b/l. No pain with calf compression b/l. No edema noted b/l. No cyanosis or clubbing noted b/l LE.  Neurologic Normal speech. Oriented to person, place, and time. Pt has subjective symptoms of neuropathy. Protective sensation intact 5/5 intact bilaterally with 10g monofilament b/l. Vibratory sensation intact b/l.  Dermatologic Pedal integument with normal turgor, texture and tone b/l LE. No open wounds b/l. No interdigital macerations b/l. Toenails 1-5 b/l elongated, thickened, discolored with subungual debris. +Tenderness with dorsal palpation of nailplates. No hyperkeratotic or  porokeratotic lesions present.  Orthopedic: HAV with bunion deformity noted b/l LE. Severe hammertoe deformity noted R 2nd toe. No evidence of interdigital lesions bilateral great toes and bilateral 2nd toes.   Radiographs: None    Latest Ref Rng & Units 04/20/2021    1:30 PM  Hemoglobin A1C  Hemoglobin-A1c 4.8 - 5.6 % 5.7    Assessment/Plan: 1. Pain due to onychomycosis of toenails of both feet   2. Polyneuropathy     -Examined patient. -Patient to continue soft, supportive shoe gear daily. -Mycotic toenails 1-5 bilaterally were debrided in length and girth with sterile nail nippers and dremel without incident. -Patient/POA to call should there be question/concern in the interim.   Return in about 3 months (around 03/23/2022).  Freddie Breech, DPM

## 2022-01-03 NOTE — Progress Notes (Signed)
Negative cologuard. Repeat in three years. This has been reviewed with the patient.

## 2022-01-04 NOTE — Progress Notes (Deleted)
Established patient visit   Patient: Vickie Bowman   DOB: [birth year]   71 y.o. Female  MRN: [id number] Visit Date: 01/05/2022   No chief complaint on file.  Subjective    HPI  Follow up -chronic pain. --referred to pain management provider. Referral has been approved but needs to be scheduled.  Patient reluctant to go to pain management provider. Instead, she has been trying to gradually cut back the dosing of both her narcotic pain medication and her use of alprazolam.  --we have reviewed that back pain medication she was on was very high and she also had frequent dosing of narcotic pain medication  --also on very high and frequent dosing of benzodiazepines.  --had discussed the need for pain management and need to cut back the use of both medications due to the long term effects of these medications which included dependency.  -she does continue to take alprazolam in the morning. Sometimes she does need to take one at night.   Medications: Outpatient Medications Prior to Visit  Medication Sig   albuterol (PROVENTIL) (2.5 MG/3ML) 0.083% nebulizer solution Take 3 mLs (2.5 mg total) by nebulization every 6 (six) hours as needed for wheezing or shortness of breath.   albuterol (VENTOLIN HFA) 108 (90 Base) MCG/ACT inhaler Inhale 2 puffs into the lungs every 6 (six) hours as needed for wheezing or shortness of breath.   ALPRAZolam (XANAX) 1 MG tablet Take 1 tablet (1 mg total) by mouth 2 (two) times daily.   aspirin EC 81 MG EC tablet Take 1 tablet (81 mg total) by mouth daily.   BREZTRI AEROSPHERE 160-9-4.8 MCG/ACT AERO Inhale 2 puffs into the lungs in the morning and at bedtime.   Cholecalciferol (VITAMIN D) 2000 units CAPS Take 1 capsule (2,000 Units total) by mouth daily.   DULoxetine (CYMBALTA) 60 MG capsule Take 1 capsule by mouth once daily   HYDROcodone-acetaminophen (NORCO) 7.5-325 MG tablet Take 1 tablet by mouth 2 (two) times daily as needed.   ipratropium-albuterol (DUONEB)  0.5-2.5 (3) MG/3ML SOLN Take 3 mLs by nebulization every 6 (six) hours as needed.   levofloxacin (LEVAQUIN) 500 MG tablet Take 500 mg by mouth daily.   Lidocaine (ZTLIDO) 1.8 % PTCH Apply 1 patch topically daily.   meloxicam (MOBIC) 7.5 MG tablet Take 1 tablet (7.5 mg total) by mouth daily.   methylPREDNISolone (MEDROL) 4 MG TBPK tablet Take by mouth as directed for 6 days   metoprolol succinate (TOPROL-XL) 25 MG 24 hr tablet Take 0.5 tablets (12.5 mg total) by mouth daily.   rosuvastatin (CRESTOR) 20 MG tablet Take 1 tablet (20 mg total) by mouth daily.   Spacer/Aero-Holding Chambers (AEROCHAMBER MV) inhaler Use as instructed   No facility-administered medications prior to visit.    Review of Systems  {Labs (Optional):23779}   Objective    There were no vitals taken for this visit. BP Readings from Last 3 Encounters:  11/24/21 105/66  11/19/21 116/80  11/09/21 103/68    Wt Readings from Last 3 Encounters:  11/24/21 133 lb 12.8 oz (60.7 kg)  11/19/21 133 lb 12.8 oz (60.7 kg)  11/09/21 133 lb 6.4 oz (60.5 kg)    Physical Exam  ***  No results found for any visits on 01/05/22.  Assessment & Plan     Problem List Items Addressed This Visit   None    No follow-ups on file.         Carlean Jews, NP  [facility]  at [facility] [phone] (phone) [phone] (fax)  [place]

## 2022-01-05 ENCOUNTER — Ambulatory Visit: Payer: Medicare Other | Admitting: Nurse Practitioner

## 2022-01-13 ENCOUNTER — Ambulatory Visit (INDEPENDENT_AMBULATORY_CARE_PROVIDER_SITE_OTHER): Payer: Medicare Other | Admitting: Nurse Practitioner

## 2022-01-13 ENCOUNTER — Encounter: Payer: Self-pay | Admitting: Nurse Practitioner

## 2022-01-13 VITALS — BP 92/60 | HR 79 | Ht 68.11 in | Wt 134.0 lb

## 2022-01-13 DIAGNOSIS — R319 Hematuria, unspecified: Secondary | ICD-10-CM | POA: Diagnosis not present

## 2022-01-13 DIAGNOSIS — M5441 Lumbago with sciatica, right side: Secondary | ICD-10-CM | POA: Diagnosis not present

## 2022-01-13 DIAGNOSIS — F411 Generalized anxiety disorder: Secondary | ICD-10-CM | POA: Diagnosis not present

## 2022-01-13 DIAGNOSIS — J441 Chronic obstructive pulmonary disease with (acute) exacerbation: Secondary | ICD-10-CM | POA: Diagnosis not present

## 2022-01-13 DIAGNOSIS — M5442 Lumbago with sciatica, left side: Secondary | ICD-10-CM

## 2022-01-13 DIAGNOSIS — G8929 Other chronic pain: Secondary | ICD-10-CM

## 2022-01-13 DIAGNOSIS — N390 Urinary tract infection, site not specified: Secondary | ICD-10-CM | POA: Diagnosis not present

## 2022-01-13 LAB — POCT URINALYSIS DIP (CLINITEK)
Bilirubin, UA: NEGATIVE
Glucose, UA: NEGATIVE mg/dL
Ketones, POC UA: NEGATIVE mg/dL
Nitrite, UA: NEGATIVE
POC PROTEIN,UA: NEGATIVE
Spec Grav, UA: >=1.03 — AB (ref 1.010–1.025)
Urobilinogen, UA: 0.2 E.U./dL
pH, UA: 5.5 (ref 5.0–8.0)

## 2022-01-13 MED ORDER — ALPRAZOLAM 1 MG PO TABS: 1.0000 mg | ORAL_TABLET | Freq: Two times a day (BID) | ORAL | 0 refills | Status: DC

## 2022-01-13 MED ORDER — CIPROFLOXACIN HCL 500 MG PO TABS: 500.0000 mg | ORAL_TABLET | Freq: Two times a day (BID) | ORAL | 0 refills | Status: DC

## 2022-01-13 MED ORDER — PREDNISONE 10 MG (21) PO TBPK: ORAL_TABLET | ORAL | 0 refills | Status: DC

## 2022-01-13 MED ORDER — HYDROCODONE-ACETAMINOPHEN 7.5-325 MG PO TABS: 1.0000 | ORAL_TABLET | Freq: Two times a day (BID) | ORAL | 0 refills | Status: DC | PRN

## 2022-01-13 NOTE — Progress Notes (Signed)
Established patient visit   Patient: Vickie Bowman   DOB: 01-09-1951   71 y.o. Female  MRN: [id number] Visit Date: 01/13/2022   Chief Complaint  Patient presents with   Follow-up   Subjective    HPI  Follow up -chronic pain. --referred to pain management provider. Referral has been approved but needs to be scheduled. Patient does not want to go to pain management provider  --discussing back pain medication as she was on very high and frequent dosing of narcotic pain medication  --also on very high and frequent dosing of benzodiazepines.  --Is now using 1 tablet hydrocodone/APAP 1 time, maybe 2 times daily, and only if needed.  She was given a single prescription for 60 tablets sent to her pharmacy.  We have discussed the plan to continue cutting back dosing.   She did sign a controlled substances agreement policy at her last visit.  -she does continue to take alprazolam in the morning. Sometimes she does need to take one at night.  We decided to keep this prescription the same and will wean this after successful wean from narcotics. She has done well with the reduction in dosing.  -patient having burning with urination off and on. Has been going on for few weeks.  -congested and productive cough. This is off and on for some time. She has COPD. Does see pulmonology, but difficult to get appointment in that office. Was treated with medrol taper at last visit due to similar visits. This did not help.  -she does see cardiology.  They do routine check of lipids and cmp. Will go to that office in September for additional labs.      Medications: Outpatient Medications Prior to Visit  Medication Sig   albuterol (PROVENTIL) (2.5 MG/3ML) 0.083% nebulizer solution Take 3 mLs (2.5 mg total) by nebulization every 6 (six) hours as needed for wheezing or shortness of breath.   albuterol (VENTOLIN HFA) 108 (90 Base) MCG/ACT inhaler Inhale 2 puffs into the lungs every 6 (six) hours as needed for  wheezing or shortness of breath.   aspirin EC 81 MG EC tablet Take 1 tablet (81 mg total) by mouth daily.   BREZTRI AEROSPHERE 160-9-4.8 MCG/ACT AERO Inhale 2 puffs into the lungs in the morning and at bedtime.   Cholecalciferol (VITAMIN D) 2000 units CAPS Take 1 capsule (2,000 Units total) by mouth daily.   ipratropium-albuterol (DUONEB) 0.5-2.5 (3) MG/3ML SOLN Take 3 mLs by nebulization every 6 (six) hours as needed.   Lidocaine (ZTLIDO) 1.8 % PTCH Apply 1 patch topically daily.   metoprolol succinate (TOPROL-XL) 25 MG 24 hr tablet Take 0.5 tablets (12.5 mg total) by mouth daily.   rosuvastatin (CRESTOR) 20 MG tablet Take 1 tablet (20 mg total) by mouth daily.   Spacer/Aero-Holding Chambers (AEROCHAMBER MV) inhaler Use as instructed   [DISCONTINUED] ALPRAZolam (XANAX) 1 MG tablet Take 1 tablet (1 mg total) by mouth 2 (two) times daily.   [DISCONTINUED] DULoxetine (CYMBALTA) 60 MG capsule Take 1 capsule by mouth once daily   [DISCONTINUED] HYDROcodone-acetaminophen (NORCO) 7.5-325 MG tablet Take 1 tablet by mouth 2 (two) times daily as needed.   [DISCONTINUED] levofloxacin (LEVAQUIN) 500 MG tablet Take 500 mg by mouth daily.   [DISCONTINUED] meloxicam (MOBIC) 7.5 MG tablet Take 1 tablet (7.5 mg total) by mouth daily.   [DISCONTINUED] methylPREDNISolone (MEDROL) 4 MG TBPK tablet Take by mouth as directed for 6 days   No facility-administered medications prior to visit.    Review of  Systems  Constitutional:  Negative for activity change, appetite change, chills, fatigue and fever.  HENT:  Negative for congestion, postnasal drip, rhinorrhea, sinus pressure, sinus pain, sneezing and sore throat.   Eyes: Negative.   Respiratory:  Positive for cough and shortness of breath. Negative for chest tightness and wheezing.        Patient reporting deep congested, but non productive cough .  Cardiovascular:  Negative for chest pain and palpitations.  Gastrointestinal:  Negative for abdominal pain,  constipation, diarrhea, nausea and vomiting.  Endocrine: Positive for cold intolerance. Negative for heat intolerance, polydipsia and polyuria.  Genitourinary:  Positive for dysuria, flank pain and frequency. Negative for dyspareunia and urgency.  Musculoskeletal:  Positive for arthralgias, back pain and myalgias.  Skin:  Negative for rash.  Allergic/Immunologic: Positive for environmental allergies.  Neurological:  Negative for dizziness, weakness and headaches.  Hematological:  Negative for adenopathy.  Psychiatric/Behavioral:  The patient is nervous/anxious.       Objective     Today's Vitals   01/13/22 1333  BP: 92/60  Pulse: 79  SpO2: 96%  Weight: 134 lb (60.8 kg)  Height: 5' 8.11" (1.73 m)   Body mass index is 20.31 kg/m.   BP Readings from Last 3 Encounters:  01/13/22 92/60  11/24/21 105/66  11/19/21 116/80    Wt Readings from Last 3 Encounters:  01/13/22 134 lb (60.8 kg)  11/24/21 133 lb 12.8 oz (60.7 kg)  11/19/21 133 lb 12.8 oz (60.7 kg)    Physical Exam Vitals and nursing note reviewed.  Constitutional:      Appearance: Normal appearance. She is well-developed.  HENT:     Head: Normocephalic and atraumatic.     Mouth/Throat:     Mouth: Mucous membranes are moist.     Pharynx: Oropharynx is clear.  Eyes:     Extraocular Movements: Extraocular movements intact.     Conjunctiva/sclera: Conjunctivae normal.     Pupils: Pupils are equal, round, and reactive to light.  Cardiovascular:     Rate and Rhythm: Normal rate and regular rhythm.     Pulses: Normal pulses.     Heart sounds: Normal heart sounds.  Pulmonary:     Effort: Pulmonary effort is normal.     Breath sounds: Normal breath sounds.     Comments: Patient using nasal cannula oxygen  at  3 liters per minute.  Breath sounds are clear but diminished bilaterally  She has congested, non productive cough  Abdominal:     Palpations: Abdomen is soft.  Genitourinary:    Comments: Urine sample  positive for small WBC and trace blood.  Musculoskeletal:        General: Normal range of motion.     Cervical back: Normal range of motion and neck supple.     Comments:  Chronic lower back pain. Worse with movement.    Lymphadenopathy:     Cervical: No cervical adenopathy.  Skin:    General: Skin is warm and dry.     Capillary Refill: Capillary refill takes less than 2 seconds.  Neurological:     General: No focal deficit present.     Mental Status: She is alert and oriented to person, place, and time.  Psychiatric:        Attention and Perception: Attention and perception normal.        Mood and Affect: Affect normal. Mood is anxious.        Speech: Speech normal.  Behavior: Behavior normal. Behavior is cooperative.        Thought Content: Thought content normal.        Cognition and Memory: Cognition and memory normal.        Judgment: Judgment normal.      Results for orders placed or performed in visit on 01/13/22  POCT URINALYSIS DIP (CLINITEK)  Result Value Ref Range   Color, UA yellow yellow   Clarity, UA clear clear   Glucose, UA negative negative mg/dL   Bilirubin, UA negative negative   Ketones, POC UA negative negative mg/dL   Spec Grav, UA >=6.962 (A) 1.010 - 1.025   Blood, UA trace-intact (A) negative   pH, UA 5.5 5.0 - 8.0   POC PROTEIN,UA negative negative, trace   Urobilinogen, UA 0.2 0.2 or 1.0 E.U./dL   Nitrite, UA Negative Negative   Leukocytes, UA Small (1+) (A) Negative    Assessment & Plan    1. Urinary tract infection with hematuria, site unspecified Urine sample positive for infection today. Start cipro 500 mg  twice daily for 7 days. Send urine for culture and sensitivity and adjust antibiotics as indicated.  - POCT URINALYSIS DIP (CLINITEK) - ciprofloxacin (CIPRO) 500 MG tablet; Take 1 tablet (500 mg total) by mouth 2 (two) times daily.  Dispense: 14 tablet; Refill: 0  2. Generalized anxiety disorder May continue alprazolam 1 mg up to  twice daily as needed for acute  anxiety. New prescription sent to her pharmacy today.  - ALPRAZolam (XANAX) 1 MG tablet; Take 1 tablet (1 mg total) by mouth 2 (two) times daily.  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 Will continue prescription for hydrocodone/APAP 7.5/325 mg tablets up to twice daily as needed with #60 tablets sent to  pharmacy. Discussed her needing to continue cutting back frequency of taking this medication. Will reduce number of tablets with next prescription. She is in agreement with this plan.  - HYDROcodone-acetaminophen (NORCO) 7.5-325 MG tablet; Take 1 tablet by mouth 2 (two) times daily as needed.  Dispense: 60 tablet; Refill: 0  4. COPD with exacerbation (HCC) Add prednisone 6 day taper - take as directed for 6 days. Added cipro for uti which is also suitable for lower respiratory infections. Advised her to follow up with  her pulmonologist for further evaluation.  - ciprofloxacin (CIPRO) 500 MG tablet; Take 1 tablet (500 mg total) by mouth 2 (two) times daily.  Dispense: 14 tablet; Refill: 0 - predniSONE (STERAPRED UNI-PAK 21 TAB) 10 MG (21) TBPK tablet; 6 day taper - take by mouth as directed for 6 days  Dispense: 21 tablet; Refill: 0  Problem List Items Addressed This Visit       Respiratory   COPD with exacerbation (HCC)   Relevant Medications   ciprofloxacin (CIPRO) 500 MG tablet   predniSONE (STERAPRED UNI-PAK 21 TAB) 10 MG (21) TBPK tablet     Nervous and Auditory   Chronic midline low back pain with bilateral sciatica   Relevant Medications   ALPRAZolam (XANAX) 1 MG tablet   HYDROcodone-acetaminophen (NORCO) 7.5-325 MG tablet   predniSONE (STERAPRED UNI-PAK 21 TAB) 10 MG (21) TBPK tablet     Genitourinary   Urinary tract infection with hematuria - Primary   Relevant Medications   ciprofloxacin (CIPRO) 500 MG tablet   Other Relevant Orders   POCT  URINALYSIS DIP (CLINITEK) (Completed)  Other   Generalized anxiety disorder   Relevant Medications   ALPRAZolam (XANAX) 1 MG tablet     Return in about 5 weeks (around 02/17/2022) for med refills, mood.         Carlean Jews, NP  [facility] [phone] (fax)  [facility]

## 2022-01-15 ENCOUNTER — Other Ambulatory Visit: Payer: Self-pay | Admitting: Neurology

## 2022-01-15 ENCOUNTER — Other Ambulatory Visit: Payer: Self-pay | Admitting: Nurse Practitioner

## 2022-01-24 DIAGNOSIS — N390 Urinary tract infection, site not specified: Secondary | ICD-10-CM | POA: Insufficient documentation

## 2022-02-15 ENCOUNTER — Ambulatory Visit (INDEPENDENT_AMBULATORY_CARE_PROVIDER_SITE_OTHER): Payer: Medicare Other | Admitting: Nurse Practitioner

## 2022-02-15 ENCOUNTER — Encounter: Payer: Self-pay | Admitting: Nurse Practitioner

## 2022-02-15 VITALS — BP 115/76 | HR 77 | Ht 68.11 in | Wt 136.4 lb

## 2022-02-15 DIAGNOSIS — F411 Generalized anxiety disorder: Secondary | ICD-10-CM

## 2022-02-15 DIAGNOSIS — J449 Chronic obstructive pulmonary disease, unspecified: Secondary | ICD-10-CM | POA: Diagnosis not present

## 2022-02-15 DIAGNOSIS — Z23 Encounter for immunization: Secondary | ICD-10-CM | POA: Diagnosis not present

## 2022-02-15 DIAGNOSIS — R053 Chronic cough: Secondary | ICD-10-CM | POA: Diagnosis not present

## 2022-02-15 DIAGNOSIS — G8929 Other chronic pain: Secondary | ICD-10-CM

## 2022-02-15 DIAGNOSIS — M5441 Lumbago with sciatica, right side: Secondary | ICD-10-CM | POA: Diagnosis not present

## 2022-02-15 DIAGNOSIS — M5442 Lumbago with sciatica, left side: Secondary | ICD-10-CM

## 2022-02-15 MED ORDER — HYDROCODONE-ACETAMINOPHEN 7.5-325 MG PO TABS
1.0000 | ORAL_TABLET | Freq: Two times a day (BID) | ORAL | 0 refills | Status: DC | PRN
Start: 2022-02-15 — End: 2022-03-16

## 2022-02-15 MED ORDER — GUAIFENESIN-DM 100-10 MG/5ML PO SYRP: 5.0000 mL | ORAL_SOLUTION | Freq: Four times a day (QID) | ORAL | 2 refills | Status: DC | PRN

## 2022-02-15 MED ORDER — ALPRAZOLAM 1 MG PO TABS: 1.0000 mg | ORAL_TABLET | Freq: Two times a day (BID) | ORAL | 0 refills | Status: DC

## 2022-02-15 NOTE — Progress Notes (Signed)
Established patient visit   Patient: Vickie Bowman   DOB: 26-Dec-1950   71 y.o. Female  MRN: [id number] Visit Date: 02/15/2022   Chief Complaint  Patient presents with   Medication Refill   Subjective    HPI  -chronic pain. --in process of weaning down narcotic pain medication as she was on very high and frequent dosing of narcotic pain medication   --She has been successfully weaned to hydrocodone/APAP 7.5/325 mg, using 1 tablet hydrocodone/APAP 1 time, maybe 2 times daily, and only if needed.  She was given a single prescription for 60 tablets sent to her pharmacy.  We have discussed the plan to continue cutting back dosing.   She did sign a controlled substances agreement policy at her last visit.  -she does continue to take alprazolam in the morning. Sometimes she does need to take one at night.  We decided to keep this prescription the same and will wean this after successful wean from narcotics. She has done well with the reduction in dosing. She states that this dosing regimen is doing well for her.  -she continues to take alprazolam 1 mg twice daily when needed.  PDMP profile was reviewed today 02/15/2022. Overdose risk score is 300 with no red flags. Fill history is appropriate.  -she has COPD and uses oxygen at all times.  She does see pulmonology. She does not see them but one or two times daily.  Does have deep, congested cough. This has been ongoing and likely result of COPD.  -she does see cardiology.  They do routine check of lipids and cmp. Will go to that office in September for additional labs.    Medications: Outpatient Medications Prior to Visit  Medication Sig   albuterol (PROVENTIL) (2.5 MG/3ML) 0.083% nebulizer solution Take 3 mLs (2.5 mg total) by nebulization every 6 (six) hours as needed for wheezing or shortness of breath.   albuterol (VENTOLIN HFA) 108 (90 Base) MCG/ACT inhaler Inhale 2 puffs into the lungs every 6 (six) hours as needed for wheezing or  shortness of breath.   aspirin EC 81 MG EC tablet Take 1 tablet (81 mg total) by mouth daily.   BREZTRI AEROSPHERE 160-9-4.8 MCG/ACT AERO Inhale 2 puffs into the lungs in the morning and at bedtime.   Cholecalciferol (VITAMIN D) 2000 units CAPS Take 1 capsule (2,000 Units total) by mouth daily.   ciprofloxacin (CIPRO) 500 MG tablet Take 1 tablet (500 mg total) by mouth 2 (two) times daily.   DULoxetine (CYMBALTA) 60 MG capsule Take 1 capsule by mouth once daily   ipratropium-albuterol (DUONEB) 0.5-2.5 (3) MG/3ML SOLN Take 3 mLs by nebulization every 6 (six) hours as needed.   Lidocaine (ZTLIDO) 1.8 % PTCH Apply 1 patch topically daily.   meloxicam (MOBIC) 7.5 MG tablet Take 1 tablet by mouth once daily   metoprolol succinate (TOPROL-XL) 25 MG 24 hr tablet Take 0.5 tablets (12.5 mg total) by mouth daily.   predniSONE (STERAPRED UNI-PAK 21 TAB) 10 MG (21) TBPK tablet 6 day taper - take by mouth as directed for 6 days   rosuvastatin (CRESTOR) 20 MG tablet Take 1 tablet (20 mg total) by mouth daily.   Spacer/Aero-Holding Chambers (AEROCHAMBER MV) inhaler Use as instructed   [DISCONTINUED] ALPRAZolam (XANAX) 1 MG tablet Take 1 tablet (1 mg total) by mouth 2 (two) times daily.   [DISCONTINUED] HYDROcodone-acetaminophen (NORCO) 7.5-325 MG tablet Take 1 tablet by mouth 2 (two) times daily as needed.   No facility-administered medications prior  to visit.    Review of Systems  Constitutional:  Positive for fatigue. Negative for activity change, appetite change, chills and fever.  HENT:  Negative for congestion, postnasal drip, rhinorrhea, sinus pressure, sinus pain, sneezing and sore throat.   Eyes: Negative.   Respiratory:  Positive for cough, chest tightness and wheezing. Negative for shortness of breath.        Breathng stable. Continues to be on oxygen at 2 liters per minute.   Cardiovascular:  Negative for chest pain and palpitations.  Gastrointestinal:  Negative for abdominal pain,  constipation, diarrhea, nausea and vomiting.  Endocrine: Negative for cold intolerance, heat intolerance, polydipsia and polyuria.  Genitourinary:  Negative for dyspareunia, dysuria, flank pain, frequency and urgency.  Musculoskeletal:  Positive for arthralgias, back pain and myalgias.  Skin:  Negative for rash.  Allergic/Immunologic: Negative for environmental allergies.  Neurological:  Negative for dizziness, weakness and headaches.  Hematological:  Negative for adenopathy.  Psychiatric/Behavioral:  The patient is nervous/anxious.        Objective     Today's Vitals   02/15/22 1332  BP: 115/76  Pulse: 77  SpO2: 94%  Weight: 136 lb 6.4 oz (61.9 kg)  Height: 5' 8.11" (1.73 m)   Body mass index is 20.67 kg/m.   BP Readings from Last 3 Encounters:  02/15/22 115/76  01/13/22 92/60  11/24/21 105/66    Wt Readings from Last 3 Encounters:  02/15/22 136 lb 6.4 oz (61.9 kg)  01/13/22 134 lb (60.8 kg)  11/24/21 133 lb 12.8 oz (60.7 kg)    Physical Exam Vitals and nursing note reviewed.  Constitutional:      Appearance: Normal appearance. She is well-developed.  HENT:     Head: Normocephalic and atraumatic.  Eyes:     Pupils: Pupils are equal, round, and reactive to light.  Cardiovascular:     Rate and Rhythm: Normal rate and regular rhythm.     Pulses: Normal pulses.     Heart sounds: Normal heart sounds.  Pulmonary:     Effort: Pulmonary effort is normal.     Breath sounds: Normal breath sounds.     Comments: Patient using nasal cannula oxygen  at  liters per minute.  Breath sounds are clear but diminished bilaterally  Abdominal:     Palpations: Abdomen is soft.  Musculoskeletal:        General: Normal range of motion.     Cervical back: Normal range of motion and neck supple.     Comments:  Chronic lower back pain. Worse with movement.    Lymphadenopathy:     Cervical: No cervical adenopathy.  Skin:    General: Skin is warm and dry.     Capillary Refill:  Capillary refill takes less than 2 seconds.  Neurological:     General: No focal deficit present.     Mental Status: She is alert and oriented to person, place, and time.  Psychiatric:        Mood and Affect: Mood normal.        Behavior: Behavior normal.        Thought Content: Thought content normal.        Judgment: Judgment normal.       Assessment & Plan    1. Chronic cough Recommend to Robitussin-DM up to 4 times daily as needed for chronic cough.  Continue inhalers and respiratory medications as needed as prescribed.  Regular visits with pulmonology as scheduled. - guaiFENesin-dextromethorphan (ROBITUSSIN DM) 100-10 MG/5ML syrup;  Take 5 mLs by mouth every 6 (six) hours as needed for cough.  Dispense: 236 mL; Refill: 2  2. Chronic obstructive pulmonary disease, unspecified COPD type ([place]) Continue inhalers and respiratory medications as needed as prescribed.  Regular visits with pulmonology as scheduled.  3. Generalized anxiety disorder Doing well with reduced frequency of Xanax dosing at 2 times daily.  No changes to prescription.  Recommend she try to reduce dosing to 0.5 mg twice daily.  As able. - ALPRAZolam (XANAX) 1 MG tablet; Take 1 tablet (1 mg total) by mouth 2 (two) times daily.  Dispense: 60 tablet; Refill: 0  4. Chronic midline low back pain with bilateral sciatica She may continue hydrocodone APAP 7.5/325 mg tablets twice daily if needed.  She will continue to cut back dosing as able based on symptoms. - HYDROcodone-acetaminophen (NORCO) 7.5-325 MG tablet; Take 1 tablet by mouth 2 (two) times daily as needed.  Dispense: 60 tablet; Refill: 0  5. Need for influenza vaccination Flu shot given during today's visit. - Flu Vaccine QUAD High Dose(Fluad)    Problem List Items Addressed This Visit       Respiratory   Chronic obstructive pulmonary disease (HCC)   Relevant Medications   guaiFENesin-dextromethorphan (ROBITUSSIN DM) 100-10 MG/5ML syrup     Nervous and  Auditory   Chronic midline low back pain with bilateral sciatica   Relevant Medications   ALPRAZolam (XANAX) 1 MG tablet   HYDROcodone-acetaminophen (NORCO) 7.5-325 MG tablet     Other   Generalized anxiety disorder   Relevant Medications   ALPRAZolam (XANAX) 1 MG tablet   Chronic cough - Primary   Relevant Medications   guaiFENesin-dextromethorphan (ROBITUSSIN DM) 100-10 MG/5ML syrup   Other Visit Diagnoses     Need for influenza vaccination       Relevant Orders   Flu Vaccine QUAD High Dose(Fluad) (Completed)        Return in about 5 weeks (around 03/22/2022) for med refills.         Ronnell Freshwater, NP  [facility] [phone] (fax)  [place]

## 2022-02-27 DIAGNOSIS — R053 Chronic cough: Secondary | ICD-10-CM | POA: Insufficient documentation

## 2022-03-16 ENCOUNTER — Encounter: Payer: Self-pay | Admitting: Nurse Practitioner

## 2022-03-16 ENCOUNTER — Ambulatory Visit (INDEPENDENT_AMBULATORY_CARE_PROVIDER_SITE_OTHER): Payer: Medicare Other | Admitting: Nurse Practitioner

## 2022-03-16 VITALS — BP 111/72 | HR 81 | Ht 68.11 in | Wt 137.0 lb

## 2022-03-16 DIAGNOSIS — J449 Chronic obstructive pulmonary disease, unspecified: Secondary | ICD-10-CM

## 2022-03-16 DIAGNOSIS — R053 Chronic cough: Secondary | ICD-10-CM

## 2022-03-16 DIAGNOSIS — F411 Generalized anxiety disorder: Secondary | ICD-10-CM

## 2022-03-16 DIAGNOSIS — M5441 Lumbago with sciatica, right side: Secondary | ICD-10-CM | POA: Diagnosis not present

## 2022-03-16 DIAGNOSIS — B351 Tinea unguium: Secondary | ICD-10-CM

## 2022-03-16 DIAGNOSIS — G8929 Other chronic pain: Secondary | ICD-10-CM

## 2022-03-16 DIAGNOSIS — M5442 Lumbago with sciatica, left side: Secondary | ICD-10-CM

## 2022-03-16 MED ORDER — HYDROCODONE-ACETAMINOPHEN 7.5-325 MG PO TABS: 1.0000 | ORAL_TABLET | Freq: Two times a day (BID) | ORAL | 0 refills | Status: DC | PRN

## 2022-03-16 MED ORDER — ALPRAZOLAM 1 MG PO TABS: 1.0000 mg | ORAL_TABLET | Freq: Two times a day (BID) | ORAL | 2 refills | Status: DC

## 2022-03-16 MED ORDER — TERBINAFINE HCL 1 % EX CREA: 1.0000 | TOPICAL_CREAM | Freq: Two times a day (BID) | CUTANEOUS | 2 refills | Status: DC

## 2022-03-16 MED ORDER — GUAIFENESIN 200 MG/5ML PO LIQD: 15.0000 mL | Freq: Three times a day (TID) | ORAL | 2 refills | Status: DC | PRN

## 2022-03-16 NOTE — Progress Notes (Signed)
Established patient visit   Patient: Vickie Bowman   DOB: 03-18-1951   71 y.o. Female  MRN: [id number] Visit Date: 03/16/2022   Chief Complaint  Patient presents with   Medication Refill   Subjective    HPI  -chronic pain. --in process of weaning down narcotic pain medication as she was on very high and frequent dosing of narcotic pain medication   --She has been successfully weaned to hydrocodone/APAP 7.5/325 mg, using 1 tablet hydrocodone/APAP 1 time, maybe 2 times daily, and only if needed.  She was given a single prescription for 60 tablets sent to her pharmacy.  We have discussed the plan to continue cutting back dosing.   She did sign a controlled substances agreement policy at her last visit.  -she does continue to take alprazolam in the morning. Sometimes she does need to take one at night.  We decided to keep this prescription the same and will wean this after successful wean from narcotics. She has done well with the reduction in dosing. She states that this dosing regimen is doing well for her.  -she continues to take alprazolam 1 mg twice daily when needed.  PDMP profile was reviewed today 03/16/2022. Overdose risk score is 320 with no red flags. Fill history is appropriate.  -she has COPD and uses oxygen at all times.  She does see pulmonology. She does not see them but one or two times daily.  Does have deep, congested cough. This has been ongoing and likely result of COPD. she currently takes Breztrii twice daily and a rescue inhaler as needed. She also uses nebulize treatments if needed.  -she states that robitussin does not help her cough at all.  -she states that she has mentioned this to her pulmonologist and no changes were made.  -she states that she has toenail fungus of the great toenails of both feet. She has tried several OTC antifungal creams without improvement.    Medications: Outpatient Medications Prior to Visit  Medication Sig   albuterol (PROVENTIL) (2.5  MG/3ML) 0.083% nebulizer solution Take 3 mLs (2.5 mg total) by nebulization every 6 (six) hours as needed for wheezing or shortness of breath.   albuterol (VENTOLIN HFA) 108 (90 Base) MCG/ACT inhaler Inhale 2 puffs into the lungs every 6 (six) hours as needed for wheezing or shortness of breath.   aspirin EC 81 MG EC tablet Take 1 tablet (81 mg total) by mouth daily.   BREZTRI AEROSPHERE 160-9-4.8 MCG/ACT AERO Inhale 2 puffs into the lungs in the morning and at bedtime.   Cholecalciferol (VITAMIN D) 2000 units CAPS Take 1 capsule (2,000 Units total) by mouth daily.   ciprofloxacin (CIPRO) 500 MG tablet Take 1 tablet (500 mg total) by mouth 2 (two) times daily.   DULoxetine (CYMBALTA) 60 MG capsule Take 1 capsule by mouth once daily   ipratropium-albuterol (DUONEB) 0.5-2.5 (3) MG/3ML SOLN Take 3 mLs by nebulization every 6 (six) hours as needed.   Lidocaine (ZTLIDO) 1.8 % PTCH Apply 1 patch topically daily.   meloxicam (MOBIC) 7.5 MG tablet Take 1 tablet by mouth once daily   metoprolol succinate (TOPROL-XL) 25 MG 24 hr tablet Take 0.5 tablets (12.5 mg total) by mouth daily.   predniSONE (STERAPRED UNI-PAK 21 TAB) 10 MG (21) TBPK tablet 6 day taper - take by mouth as directed for 6 days   rosuvastatin (CRESTOR) 20 MG tablet Take 1 tablet (20 mg total) by mouth daily.   Spacer/Aero-Holding Chambers (AEROCHAMBER MV) inhaler Use  as instructed   [DISCONTINUED] ALPRAZolam (XANAX) 1 MG tablet Take 1 tablet (1 mg total) by mouth 2 (two) times daily.   [DISCONTINUED] guaiFENesin-dextromethorphan (ROBITUSSIN DM) 100-10 MG/5ML syrup Take 5 mLs by mouth every 6 (six) hours as needed for cough.   [DISCONTINUED] HYDROcodone-acetaminophen (NORCO) 7.5-325 MG tablet Take 1 tablet by mouth 2 (two) times daily as needed.   No facility-administered medications prior to visit.    Review of Systems  Constitutional:  Positive for fatigue. Negative for activity change, appetite change, chills and fever.  HENT:   Negative for congestion, postnasal drip, rhinorrhea, sinus pressure, sinus pain, sneezing and sore throat.   Eyes: Negative.   Respiratory:  Positive for cough and wheezing. Negative for chest tightness and shortness of breath.        Patient is to be using oxygen via nasal cannula at 2 liters per minute. She does have chronic, congested cough. Worse at night and first thing in the morning.   Cardiovascular:  Negative for chest pain and palpitations.  Gastrointestinal:  Negative for abdominal pain, constipation, diarrhea, nausea and vomiting.  Endocrine: Negative for cold intolerance, heat intolerance, polydipsia and polyuria.  Genitourinary:  Negative for dyspareunia, dysuria, flank pain, frequency and urgency.  Musculoskeletal:  Positive for arthralgias, back pain and myalgias.  Skin:  Negative for rash.  Allergic/Immunologic: Negative for environmental allergies.  Neurological:  Negative for dizziness, weakness and headaches.  Hematological:  Negative for adenopathy.  Psychiatric/Behavioral:  Positive for dysphoric mood. The patient is nervous/anxious.        Objective     Today's Vitals   03/16/22 1335  BP: 111/72  Pulse: 81  SpO2: 96%  Weight: 137 lb (62.1 kg)  Height: 5' 8.11" (1.73 m)   Body mass index is 20.76 kg/m.  BP Readings from Last 3 Encounters:  03/16/22 111/72  02/15/22 115/76  01/13/22 92/60    Wt Readings from Last 3 Encounters:  03/16/22 137 lb (62.1 kg)  02/15/22 136 lb 6.4 oz (61.9 kg)  01/13/22 134 lb (60.8 kg)    Physical Exam Vitals and nursing note reviewed.  Constitutional:      Appearance: Normal appearance. She is well-developed.  HENT:     Head: Normocephalic and atraumatic.     Nose: Nose normal.     Mouth/Throat:     Mouth: Mucous membranes are moist.     Pharynx: Oropharynx is clear.  Eyes:     Extraocular Movements: Extraocular movements intact.     Conjunctiva/sclera: Conjunctivae normal.     Pupils: Pupils are equal, round,  and reactive to light.  Cardiovascular:     Rate and Rhythm: Normal rate and regular rhythm.     Pulses: Normal pulses.     Heart sounds: Normal heart sounds.  Pulmonary:     Effort: Pulmonary effort is normal.     Breath sounds: Normal breath sounds.     Comments: Chronic, deep, congested cough. Clear and diminished breath sounds, bilaterally  Abdominal:     Palpations: Abdomen is soft.  Musculoskeletal:        General: Normal range of motion.     Cervical back: Normal range of motion and neck supple.     Comments:   Chronic lower back pain. Worse with movement.     Lymphadenopathy:     Cervical: No cervical adenopathy.  Skin:    General: Skin is warm and dry.     Capillary Refill: Capillary refill takes less than 2 seconds.  Neurological:     General: No focal deficit present.     Mental Status: She is alert and oriented to person, place, and time.  Psychiatric:        Mood and Affect: Mood normal.        Behavior: Behavior normal.        Thought Content: Thought content normal.        Judgment: Judgment normal.     Assessment & Plan    1. Chronic obstructive pulmonary disease, unspecified COPD type (HCC) Generally stable with chronic, deep, congested cough. Continue regular visits with pulmonology as scheduled.   2. Chronic cough Add guaifenesin up to three times daily as needed for cough. Continue inhalers and respiratory medications as prescribed  - Guaifenesin 200 MG/5ML LIQD; Take 15 mLs (600 mg total) by mouth 3 (three) times daily as needed.  Dispense: 420 mL; Refill: 2  3. Generalized anxiety disorder Doing very well with reduction of alprazolam dosing to BID. May continue as needed. New prescription sent to her pharmacy today  - ALPRAZolam (XANAX) 1 MG tablet; Take 1 tablet (1 mg total) by mouth 2 (two) times daily.  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 Will continue BID prn. New prescription sent to her pharmacy today.  - HYDROcodone-acetaminophen (NORCO) 7.5-325 MG tablet; Take 1 tablet by mouth 2 (two) times daily as needed.  Dispense: 60 tablet; Refill: 0  5. Toenail fungus Use lamisil cream twice daily. Reassess in 2 months.  - terbinafine (LAMISIL AT) 1 % cream; Apply 1 Application topically 2 (two) times daily.  Dispense: 42 g; Refill: 2    Problem List Items Addressed This Visit       Respiratory   Chronic obstructive pulmonary disease (HCC) - Primary   Relevant Medications   Guaifenesin 200 MG/5ML LIQD     Nervous and Auditory   Chronic midline low back pain with bilateral sciatica   Relevant Medications   ALPRAZolam (XANAX) 1 MG tablet   HYDROcodone-acetaminophen (NORCO) 7.5-325 MG tablet     Musculoskeletal and Integument   Toenail fungus   Relevant Medications   terbinafine (LAMISIL AT) 1 % cream     Other   Generalized anxiety disorder   Relevant Medications   ALPRAZolam (XANAX) 1 MG tablet   Chronic cough   Relevant Medications   Guaifenesin 200 MG/5ML LIQD     Return in about 2 months (around 05/16/2022) for mood, med refills.         Carlean Jews, NP  [facility] at [facility] [phone] (phone) [phone] (fax)  [facility]

## 2022-04-03 DIAGNOSIS — B351 Tinea unguium: Secondary | ICD-10-CM | POA: Insufficient documentation

## 2022-04-05 ENCOUNTER — Encounter: Payer: Self-pay | Admitting: Podiatry

## 2022-04-05 ENCOUNTER — Ambulatory Visit (INDEPENDENT_AMBULATORY_CARE_PROVIDER_SITE_OTHER): Payer: Medicare Other | Admitting: Podiatry

## 2022-04-05 DIAGNOSIS — M79675 Pain in left toe(s): Secondary | ICD-10-CM | POA: Diagnosis not present

## 2022-04-05 DIAGNOSIS — S90122A Contusion of left lesser toe(s) without damage to nail, initial encounter: Secondary | ICD-10-CM

## 2022-04-05 DIAGNOSIS — M79674 Pain in right toe(s): Secondary | ICD-10-CM

## 2022-04-05 DIAGNOSIS — B351 Tinea unguium: Secondary | ICD-10-CM

## 2022-04-05 DIAGNOSIS — G629 Polyneuropathy, unspecified: Secondary | ICD-10-CM

## 2022-04-05 DIAGNOSIS — S92502A Displaced unspecified fracture of left lesser toe(s), initial encounter for closed fracture: Secondary | ICD-10-CM

## 2022-04-06 ENCOUNTER — Other Ambulatory Visit: Payer: Self-pay | Admitting: Neurology

## 2022-04-10 NOTE — Progress Notes (Signed)
Subjective:  Patient ID: Vickie Bowman, female    DOB: [birth year],  MRN: [id number]  Vickie Bowman presents to clinic today for at risk foot care with history of peripheral neuropathy and painful thick toenails that are difficult to trim. Pain interferes with ambulation. Aggravating factors include wearing enclosed shoe gear. Pain is relieved with periodic professional debridement.  She is on supplemental oxygen for COPD. She presents to office with O2 concentrator on 82%.  Chief Complaint  Patient presents with   Toe Pain    Pt states she sat on her 2nd toe on left foot and now it is bruised    Nail Problem    Nail Trim  Not Diabetic  PCP - Vincent Gros, last OV September 2023   New problem(s): with chief concern of injury to L 2nd toe. Injury occurred yesterday. Injury recurred as a result of a traumatic incident in which she was pulling her chair up to the table. The chair leg pressed on her left 2nd digit and she didn't realize it due to neuropathy. Patient states aggravating factor(s) is/are direct pressure.  Patient has tried none.        PCP is Carlean Jews, NP.  Allergies  Allergen Reactions   Penicillins Swelling    Has patient had a PCN reaction causing immediate rash, facial/tongue/throat swelling, SOB or lightheadedness with hypotension: Yes Has patient had a PCN reaction causing severe rash involving mucus membranes or skin necrosis: No Has patient had a PCN reaction that required hospitalization: No Has patient had a PCN reaction occurring within the last 10 years: No If all of the above answers are "NO", then may proceed with Cephalosporin use.    Review of Systems: Negative except as noted in the HPI.  Objective:   Vickie Bowman is a pleasant 71 y.o. female WD, WN in NAD. AAO x 3. Vascular CFT <3 seconds b/l LE. Palpable DP/PT pulses b/l LE. Digital hair sparse b/l. Skin temperature gradient WNL b/l. No pain with calf compression b/l. No edema  noted b/l.  Neurologic Normal speech. Oriented to person, place, and time. Pt has subjective symptoms of neuropathy. Protective sensation intact 5/5 intact bilaterally with 10g monofilament b/l. Vibratory sensation intact b/l.  Dermatologic Ecchymosis and edema noted left 2nd digit proximal to DIPJ.  No open wounds b/l. No interdigital macerations b/l. Toenails 1-5 b/l elongated, thickened, discolored with subungual debris. +Tenderness with dorsal palpation of nailplates. No hyperkeratotic or porokeratotic lesions present.  Orthopedic: HAV with bunion deformity noted b/l LE. Severe hammertoe deformity noted R 2nd toe. No evidence of interdigital lesions bilateral great toes and bilateral 2nd toes.   Xray findings left foot: No gas in tissues left foot. Soft tissue swelling present L 2nd toe. No foreign body evident L 2nd toe.  Assessment/Plan: 1. Pain due to onychomycosis of toenails of both feet   2. Contusion of second toe of left foot, initial encounter   3. Polyneuropathy     No orders of the defined types were placed in this encounter.   -Patient was evaluated and treated. All patient's and/or POA's questions/concerns answered on today's visit. -Educated patient on buddy splinting of left 2nd digit as well as evaluating circulation of digit after wrap is applied. She related understanding. -Toenails 1-5 b/l were debrided in length and girth with sterile nail nippers and dremel without iatrogenic bleeding.  -Xray of left foot was performed and reviewed with patient and/or POA. -Patient scheduled to see Dr. Ovid Curd  in 3 weeks for follow up of contusion left 2nd toe. -Patient/POA to call should there be question/concern in the interim.   Return in about 3 weeks (around 04/26/2022).  Freddie Breech, DPM

## 2022-04-26 ENCOUNTER — Ambulatory Visit: Payer: Medicare Other | Admitting: Podiatry

## 2022-05-03 ENCOUNTER — Encounter: Payer: Self-pay | Admitting: Neurology

## 2022-05-03 ENCOUNTER — Ambulatory Visit (INDEPENDENT_AMBULATORY_CARE_PROVIDER_SITE_OTHER): Payer: Medicare Other | Admitting: Neurology

## 2022-05-03 VITALS — BP 130/80 | HR 79 | Ht 68.0 in | Wt 139.0 lb

## 2022-05-03 DIAGNOSIS — G6289 Other specified polyneuropathies: Secondary | ICD-10-CM | POA: Diagnosis not present

## 2022-05-03 MED ORDER — VITAMIN B-12 1000 MCG PO TABS: 1000.0000 ug | ORAL_TABLET | Freq: Every day | ORAL | 6 refills | Status: AC

## 2022-05-03 MED ORDER — DULOXETINE HCL 60 MG PO CPEP: 60.0000 mg | ORAL_CAPSULE | Freq: Every day | ORAL | 6 refills | Status: DC

## 2022-05-03 MED ORDER — DULOXETINE HCL 30 MG PO CPEP: 30.0000 mg | ORAL_CAPSULE | Freq: Every day | ORAL | 6 refills | Status: DC

## 2022-05-03 NOTE — Patient Instructions (Signed)
Continue all your other medications  Start Vitamin B12 supplement daily  Increase the Duloxetine to 90 mg (60 mg + 30 mg tablets) every morning  Follow up in 6 months

## 2022-05-03 NOTE — Progress Notes (Signed)
[facility]  PATIENT: Vickie Bowman DOB: 07-09-1950  REQUESTING CLINICIAN: Carlean Jews, NP HISTORY FROM: Patient  REASON FOR VISIT: Bilateral feet and hands numbness    HISTORICAL  CHIEF COMPLAINT:  Chief Complaint  Patient presents with   Follow-up    RM 13 here for 6 month f/u. Sx remain same would like to discuss increasing duloxetine today.     INTERVAL HISTORY 05/03/2022: Patient presents today for follow-up, at last visit we tried to prescribe pregabalin but she did not tolerate the side effect of being drowsy.  We also started her on a lidocaine patch (ZTLido) but she reports that it was not helpful.  She feels that the duloxetine has been helpful to decrease her neuropathic pain and would like to increase on the medication.  Otherwise she is doing well no other complaints.   INTERVAL HISTORY 10/26/21:  Patient presents today for follow-up, last visit was in December.  At that time plan was to start duloxetine 60 mg daily.  She is compliant with the medication, states that it helps with the neuropathy but she still feels the pain mostly at night.  She has not tried any additional medication for it.  Again pain is described as tingling and burning with constant numbness in both feet and hands.  Sample given to patient Ztlido   HISTORY OF PRESENT ILLNESS:  This is a 71 year old woman with past medical history of anxiety and panic attack, COPD on additional oxygen who is presenting with numbness in both feet and hands.  Patient stated numbness started in the past 69-month, she notices more when going to bed.  Denies any pain in the lower extremities, denies any back pain and denies any shooting pain.  She reported trying gabapentin in the past without benefit. Currently she is not on medication for numbness. Denies any history of diabetes.  Denies any falls.     OTHER MEDICAL CONDITIONS: Anxiety, COPD, Panic attack   REVIEW OF SYSTEMS: Full 14  system review of systems performed and negative with exception of: as noted in the HPI   ALLERGIES: Allergies  Allergen Reactions   Penicillins Swelling    Has patient had a PCN reaction causing immediate rash, facial/tongue/throat swelling, SOB or lightheadedness with hypotension: Yes Has patient had a PCN reaction causing severe rash involving mucus membranes or skin necrosis: No Has patient had a PCN reaction that required hospitalization: No Has patient had a PCN reaction occurring within the last 10 years: No If all of the above answers are "NO", then may proceed with Cephalosporin use.     HOME MEDICATIONS: Outpatient Medications Prior to Visit  Medication Sig Dispense Refill   albuterol (PROVENTIL) (2.5 MG/3ML) 0.083% nebulizer solution Take 3 mLs (2.5 mg total) by nebulization every 6 (six) hours as needed for wheezing or shortness of breath. 75 mL 12   albuterol (VENTOLIN HFA) 108 (90 Base) MCG/ACT inhaler Inhale 2 puffs into the lungs every 6 (six) hours as needed for wheezing or shortness of breath. 8 g 2   ALPRAZolam (XANAX) 1 MG tablet Take 1 tablet (1 mg total) by mouth 2 (two) times daily. 60 tablet 2   aspirin EC 81 MG EC tablet Take 1 tablet (81 mg total) by mouth daily. 30 tablet 0   BREZTRI AEROSPHERE 160-9-4.8 MCG/ACT AERO Inhale 2 puffs into the lungs in the morning and at bedtime. 10.7 g 11   Cholecalciferol (VITAMIN D) 2000 units CAPS Take 1 capsule (2,000 Units  total) by mouth daily. 30 capsule 5   ciprofloxacin (CIPRO) 500 MG tablet Take 1 tablet (500 mg total) by mouth 2 (two) times daily. 14 tablet 0   Guaifenesin 200 MG/5ML LIQD Take 15 mLs (600 mg total) by mouth 3 (three) times daily as needed. 420 mL 2   HYDROcodone-acetaminophen (NORCO) 7.5-325 MG tablet Take 1 tablet by mouth 2 (two) times daily as needed. 60 tablet 0   ipratropium-albuterol (DUONEB) 0.5-2.5 (3) MG/3ML SOLN Take 3 mLs by nebulization every 6 (six) hours as needed. 90 mL 3   Lidocaine  (ZTLIDO) 1.8 % PTCH Apply 1 patch topically daily. 30 patch 2   meloxicam (MOBIC) 7.5 MG tablet Take 1 tablet by mouth once daily 90 tablet 0   metoprolol succinate (TOPROL-XL) 25 MG 24 hr tablet Take 0.5 tablets (12.5 mg total) by mouth daily. 60 tablet 0   predniSONE (STERAPRED UNI-PAK 21 TAB) 10 MG (21) TBPK tablet 6 day taper - take by mouth as directed for 6 days 21 tablet 0   rosuvastatin (CRESTOR) 20 MG tablet Take 1 tablet (20 mg total) by mouth daily. 90 tablet 3   Spacer/Aero-Holding Chambers (AEROCHAMBER MV) inhaler Use as instructed 1 each 0   terbinafine (LAMISIL AT) 1 % cream Apply 1 Application topically 2 (two) times daily. 42 g 2   DULoxetine (CYMBALTA) 60 MG capsule Take 1 capsule by mouth once daily 30 capsule 3   No facility-administered medications prior to visit.    PAST MEDICAL HISTORY: Past Medical History:  Diagnosis Date   Anxiety    CHF (congestive heart failure) (HCC)    COPD (chronic obstructive pulmonary disease) (HCC)    Lumbar herniated disc    Tobacco abuse     PAST SURGICAL HISTORY: Past Surgical History:  Procedure Laterality Date   LEFT HEART CATH AND CORONARY ANGIOGRAPHY N/A 02/05/2018   Procedure: LEFT HEART CATH AND CORONARY ANGIOGRAPHY;  Surgeon: Yvonne Kendall, MD;  Location: [facility];  Service: Cardiovascular;  Laterality: N/A;    FAMILY HISTORY: Family History  Problem Relation Age of Onset   Liver cancer Mother    Breast cancer Sister    CAD Neg Hx     SOCIAL HISTORY: Social History   Socioeconomic History   Marital status: Widowed    Spouse name: Not on file   Number of children: Not on file   Years of education: Not on file   Highest education level: Not on file  Occupational History   Not on file  Tobacco Use   Smoking status: Former    Packs/day: 1.50    Years: 53.00    Total pack years: 79.50    Types: Cigarettes    Start date: 05/24/1967    Quit date: 01/07/2018    Years since quitting: 4.3   Smokeless  tobacco: Never   Tobacco comments:    Smoked since age 25, quit 12/2017  Vaping Use   Vaping Use: Never used  Substance and Sexual Activity   Alcohol use: Yes    Comment: Rare, glass of wine no more than every few weeks   Drug use: Never   Sexual activity: Not Currently  Other Topics Concern   Not on file  Social History Narrative   Not on file   Social Determinants of Health   Financial Resource Strain: Not on file  Food Insecurity: Not on file  Transportation Needs: Not on file  Physical Activity: Not on file  Stress: Not on file  Social Connections: Not on file  Intimate Partner Violence: Not on file     PHYSICAL EXAM  GENERAL EXAM/CONSTITUTIONAL: Vitals:  Vitals:   05/03/22 1354  BP: 130/80  Pulse: 79  Weight: 139 lb (63 kg)  Height: 5\' 8"  (1.727 m)     Body mass index is 21.13 kg/m. Wt Readings from Last 3 Encounters:  05/03/22 139 lb (63 kg)  03/16/22 137 lb (62.1 kg)  02/15/22 136 lb 6.4 oz (61.9 kg)   Patient is in no distress; well developed, nourished and groomed; neck is supple  EYES: Visual fields full to confrontation, Extraocular movements intacts,   MUSCULOSKELETAL: Gait, strength, tone, movements noted in Neurologic exam below  NEUROLOGIC: MENTAL STATUS:      No data to display         awake, alert, oriented to person, place and time recent and remote memory intact normal attention and concentration language fluent, comprehension intact, naming intact fund of knowledge appropriate  CRANIAL NERVE:  2nd, 3rd, 4th, 6th -visual fields full to confrontation, extraocular muscles intact, no nystagmus 5th - facial sensation symmetric 7th - facial strength symmetric 8th - hearing intact 9th - palate elevates symmetrically, uvula midline 11th - shoulder shrug symmetric 12th - tongue protrusion midline  MOTOR:  normal bulk and tone, full strength in the BUE, BLE  SENSORY:  Decrease vibration up to ankle bilaterally    COORDINATION:  finger-nose-finger, fine finger movements normal  REFLEXES:  deep tendon reflexes present and symmetric  GAIT/STATION:  normal   DIAGNOSTIC DATA (LABS, IMAGING, TESTING) - I reviewed patient records, labs, notes, testing and imaging myself where available.  Lab Results  Component Value Date   WBC 5.7 07/01/2020   HGB 13.6 07/01/2020   HCT 41.2 07/01/2020   MCV 90 07/01/2020   PLT 192 07/01/2020      Component Value Date/Time   NA 144 07/01/2020 1430   K 4.8 07/01/2020 1430   CL 106 07/01/2020 1430   CO2 23 07/01/2020 1430   GLUCOSE 89 07/01/2020 1430   GLUCOSE 154 (H) 03/05/2018 0327   BUN 15 07/01/2020 1430   CREATININE 0.85 07/01/2020 1430   CALCIUM 9.6 07/01/2020 1430   PROT 6.2 07/01/2020 1430   ALBUMIN 4.5 07/01/2020 1430   AST 18 07/01/2020 1430   ALT 19 07/01/2020 1430   ALKPHOS 85 07/01/2020 1430   BILITOT 0.4 07/01/2020 1430   GFRNONAA 69 07/01/2020 1430   GFRAA 79 07/01/2020 1430   Lab Results  Component Value Date   CHOL 129 07/01/2020   HDL 59 07/01/2020   LDLCALC 53 07/01/2020   TRIG 86 07/01/2020   CHOLHDL 2.2 07/01/2020   Lab Results  Component Value Date   HGBA1C 5.7 (H) 04/20/2021   Lab Results  Component Value Date   VITAMINB12 310 04/20/2021   Lab Results  Component Value Date   TSH 2.950 04/20/2021     ASSESSMENT AND PLAN  71 y.o. year old female with anxiety/panic attack, COPD on home oxygen who is presenting with numbness and pain in the bilateral feet and hands consistent with peripheral neuropathy.  We have tried her on duloxetine which patient reports has been helpful but not enough to take away her symptoms.  We did try her on pregabalin but she could not tolerate the side effects and the ZTLido was not helpful.  At this point we will try to increase the duloxetine to 90 mg daily and also give her vitamin B12 supplement, her previous  level was 310.  Advised patient to contact me for any side effects of the  medication otherwise I will see her in 6 months for follow-up.    1. Other polyneuropathy     Patient Instructions  Continue all your other medications  Start Vitamin B12 supplement daily  Increase the Duloxetine to 90 mg (60 mg + 30 mg tablets) every morning  Follow up in 6 months   No orders of the defined types were placed in this encounter.   Meds ordered this encounter  Medications   DULoxetine (CYMBALTA) 60 MG capsule    Sig: Take 1 capsule (60 mg total) by mouth daily. Take for an additional 30 mg for a total of 90 mg    Dispense:  30 capsule    Refill:  6   DULoxetine (CYMBALTA) 30 MG capsule    Sig: Take 1 capsule (30 mg total) by mouth daily. Take a additional 60 mg for a total of 90 mg    Dispense:  30 capsule    Refill:  6   cyanocobalamin (VITAMIN B12) 1000 MCG tablet    Sig: Take 1 tablet (1,000 mcg total) by mouth daily.    Dispense:  30 tablet    Refill:  6    Return in about 6 months (around 11/02/2022).  I have spent a total of 30 minutes dedicated to this patient today, preparing to see patient, performing a medically appropriate examination and evaluation, ordering tests and/or medications and procedures, and counseling and educating the patient/family/caregiver; independently interpreting result and communicating results to the family/patient/caregiver; and documenting clinical information in the electronic medical record.    Windell NorfolkAmadou Sabri Teal, MD 05/03/2022, 2:34 PM  [facility] [address], [address] [place], [place] [address] [phone]

## 2022-05-07 ENCOUNTER — Other Ambulatory Visit: Payer: Self-pay | Admitting: Nurse Practitioner

## 2022-05-12 NOTE — Telephone Encounter (Signed)
Pt calling inquiring about this refill. Please send refill if appropriate. Pt has appointment on 05/17/2022.  Elim Peale Zimmerman Rumple, CMA

## 2022-05-14 ENCOUNTER — Other Ambulatory Visit: Payer: Self-pay | Admitting: Nurse Practitioner

## 2022-05-16 NOTE — Progress Notes (Deleted)
Established patient visit   Patient: Vickie Bowman   DOB: 1950/06/27   71 y.o. Female  MRN: [id number] Visit Date: 05/17/2022   No chief complaint on file.  Subjective    HPI  Follow up  -chronic pain. --in process of weaning down narcotic pain medication as she was on very high and frequent dosing of narcotic pain medication   --She has been successfully weaned to hydrocodone/APAP 7.5/325 mg, using 1 tablet hydrocodone/APAP 1 time, maybe 2 times daily, and only if needed.  She was given a single prescription for 60 tablets sent to her pharmacy.  We have discussed the plan to continue cutting back dosing.   She did sign a controlled substances agreement policy at her last visit.  -she does continue to take alprazolam in the morning. Sometimes she does need to take one at night.  We decided to keep this prescription the same and will wean this after successful wean from narcotics. She has done well with the reduction in dosing. She states that this dosing regimen is doing well for her.  -she continues to take alprazolam 1 mg twice daily when needed.  PDMP profile was reviewed today 05/16/2022. Overdose risk score is below average at 070 with no red flags or state indicators. Fill history is appropriate.  -last  fill for hydrocodone and alprazolam was 03/16/2022.  -she has COPD and uses oxygen at all times.  She does see pulmonology. She does not see them but one or two times daily.    Medications: Outpatient Medications Prior to Visit  Medication Sig   albuterol (PROVENTIL) (2.5 MG/3ML) 0.083% nebulizer solution Take 3 mLs (2.5 mg total) by nebulization every 6 (six) hours as needed for wheezing or shortness of breath.   albuterol (VENTOLIN HFA) 108 (90 Base) MCG/ACT inhaler Inhale 2 puffs into the lungs every 6 (six) hours as needed for wheezing or shortness of breath.   ALPRAZolam (XANAX) 1 MG tablet Take 1 tablet (1 mg total) by mouth 2 (two) times daily.   aspirin EC 81 MG EC tablet  Take 1 tablet (81 mg total) by mouth daily.   BREZTRI AEROSPHERE 160-9-4.8 MCG/ACT AERO Inhale 2 puffs into the lungs in the morning and at bedtime.   Cholecalciferol (VITAMIN D) 2000 units CAPS Take 1 capsule (2,000 Units total) by mouth daily.   ciprofloxacin (CIPRO) 500 MG tablet Take 1 tablet (500 mg total) by mouth 2 (two) times daily.   cyanocobalamin (VITAMIN B12) 1000 MCG tablet Take 1 tablet (1,000 mcg total) by mouth daily.   DULoxetine (CYMBALTA) 30 MG capsule Take 1 capsule (30 mg total) by mouth daily. Take a additional 60 mg for a total of 90 mg   DULoxetine (CYMBALTA) 60 MG capsule Take 1 capsule (60 mg total) by mouth daily. Take for an additional 30 mg for a total of 90 mg   Guaifenesin 200 MG/5ML LIQD Take 15 mLs (600 mg total) by mouth 3 (three) times daily as needed.   HYDROcodone-acetaminophen (NORCO) 7.5-325 MG tablet Take 1 tablet by mouth 2 (two) times daily as needed.   ipratropium-albuterol (DUONEB) 0.5-2.5 (3) MG/3ML SOLN Take 3 mLs by nebulization every 6 (six) hours as needed.   Lidocaine (ZTLIDO) 1.8 % PTCH Apply 1 patch topically daily.   meloxicam (MOBIC) 7.5 MG tablet Take 1 tablet by mouth once daily   metoprolol succinate (TOPROL-XL) 25 MG 24 hr tablet Take 0.5 tablets (12.5 mg total) by mouth daily.   predniSONE (STERAPRED UNI-PAK 21  TAB) 10 MG (21) TBPK tablet 6 day taper - take by mouth as directed for 6 days   rosuvastatin (CRESTOR) 20 MG tablet Take 1 tablet (20 mg total) by mouth daily.   Spacer/Aero-Holding Chambers (AEROCHAMBER MV) inhaler Use as instructed   terbinafine (LAMISIL AT) 1 % cream Apply 1 Application topically 2 (two) times daily.   No facility-administered medications prior to visit.    Review of Systems  {Labs (Optional):23779}   Objective    There were no vitals filed for this visit. There is no height or weight on file to calculate BMI.  BP Readings from Last 3 Encounters:  05/03/22 130/80  03/16/22 111/72  02/15/22 115/76     Wt Readings from Last 3 Encounters:  05/03/22 139 lb (63 kg)  03/16/22 137 lb (62.1 kg)  02/15/22 136 lb 6.4 oz (61.9 kg)    Physical Exam  ***  No results found for any visits on 05/17/22.  Assessment & Plan     Problem List Items Addressed This Visit   None    No follow-ups on file.         Carlean Jews, NP  [facility] at [facility] [phone] (phone) [phone] (fax)  [facility]

## 2022-05-17 ENCOUNTER — Ambulatory Visit: Payer: Medicare Other | Admitting: Nurse Practitioner

## 2022-06-06 ENCOUNTER — Other Ambulatory Visit: Payer: Self-pay | Admitting: Nurse Practitioner

## 2022-06-06 DIAGNOSIS — G8929 Other chronic pain: Secondary | ICD-10-CM

## 2022-06-06 NOTE — Telephone Encounter (Signed)
L.O.V: 03/16/22  N.O.V: 06/22/22  L.R.F: 05/12/22 30 tab 0 refill

## 2022-06-22 ENCOUNTER — Ambulatory Visit (INDEPENDENT_AMBULATORY_CARE_PROVIDER_SITE_OTHER): Payer: 59 | Admitting: Nurse Practitioner

## 2022-06-22 ENCOUNTER — Encounter: Payer: Self-pay | Admitting: Nurse Practitioner

## 2022-06-22 VITALS — BP 119/75 | HR 75 | Ht 68.0 in | Wt 139.4 lb

## 2022-06-22 DIAGNOSIS — M5442 Lumbago with sciatica, left side: Secondary | ICD-10-CM | POA: Diagnosis not present

## 2022-06-22 DIAGNOSIS — J449 Chronic obstructive pulmonary disease, unspecified: Secondary | ICD-10-CM

## 2022-06-22 DIAGNOSIS — J441 Chronic obstructive pulmonary disease with (acute) exacerbation: Secondary | ICD-10-CM

## 2022-06-22 DIAGNOSIS — F411 Generalized anxiety disorder: Secondary | ICD-10-CM | POA: Diagnosis not present

## 2022-06-22 DIAGNOSIS — M5441 Lumbago with sciatica, right side: Secondary | ICD-10-CM | POA: Diagnosis not present

## 2022-06-22 DIAGNOSIS — G8929 Other chronic pain: Secondary | ICD-10-CM

## 2022-06-22 DIAGNOSIS — N390 Urinary tract infection, site not specified: Secondary | ICD-10-CM | POA: Diagnosis not present

## 2022-06-22 DIAGNOSIS — R319 Hematuria, unspecified: Secondary | ICD-10-CM

## 2022-06-22 LAB — POCT URINALYSIS DIP (CLINITEK)
Bilirubin, UA: NEGATIVE
Blood, UA: NEGATIVE
Glucose, UA: NEGATIVE mg/dL
Ketones, POC UA: NEGATIVE mg/dL
Nitrite, UA: NEGATIVE
POC PROTEIN,UA: NEGATIVE
Spec Grav, UA: 1.025 (ref 1.010–1.025)
Urobilinogen, UA: 1 E.U./dL
pH, UA: 7 (ref 5.0–8.0)

## 2022-06-22 MED ORDER — CIPROFLOXACIN HCL 500 MG PO TABS: 500.0000 mg | ORAL_TABLET | Freq: Two times a day (BID) | ORAL | 0 refills | Status: DC

## 2022-06-22 MED ORDER — ALPRAZOLAM 1 MG PO TABS: 1.0000 mg | ORAL_TABLET | Freq: Two times a day (BID) | ORAL | 2 refills | Status: DC

## 2022-06-22 MED ORDER — HYDROCODONE-ACETAMINOPHEN 7.5-325 MG PO TABS: 1.0000 | ORAL_TABLET | Freq: Two times a day (BID) | ORAL | 0 refills | Status: DC | PRN

## 2022-06-22 NOTE — Progress Notes (Signed)
Started on cipro during visit

## 2022-06-22 NOTE — Progress Notes (Signed)
Established patient visit   Patient: Vickie Bowman   DOB: 15-Dec-1950   72 y.o. Female  MRN: [id number] Visit Date: 06/22/2022   Chief Complaint  Patient presents with   Medication Refill   Subjective    HPI  Follow up  -chronic pain. --in process of weaning down narcotic pain medication as she was on very high and frequent dosing of narcotic pain medication   --She has been successfully weaned to hydrocodone/APAP 7.5/325 mg, using 1 tablet hydrocodone/APAP 1 time, maybe 2 times daily, and only if needed.  She was given a single prescription for 60 tablets sent to her pharmacy.  We have discussed the plan to continue cutting back dosing.   She did sign a controlled substances agreement policy at her last visit.  -she does continue to take alprazolam in the morning. Sometimes she does need to take one at night.  We decided to keep this prescription the same and will wean this after successful wean from narcotics. She has done well with the reduction in dosing. She states that this dosing regimen is doing well for her.  -she continues to take alprazolam 1 mg twice daily when needed.  PDMP profile was reviewed 06/22/2022  unintentional overdose risk score is below average at 040 with no red flags. Fill history is appropriate.  -last fill of hydrocodone was 03/16/2022 -last fill of alprazolam 06/07/2022  Has had two separate falls.  -did not get dizzy.  -hit her hip on furniture and does have significant bruising.  -she states that she die take one of her hydrocodone that night.  -states that she still has a few of her hydrocodone from prescription given 02/2022.   Burning and pain when she urinates.  -urine is dark and has smell to it   -She denies chest pain, chest pressure, or shortness of breath. She denies headaches or visual disturbances. She denies abdominal pain, nausea, vomiting, or changes in bowel or bladder habits.     Medications: Outpatient Medications Prior to Visit   Medication Sig   albuterol (PROVENTIL) (2.5 MG/3ML) 0.083% nebulizer solution Take 3 mLs (2.5 mg total) by nebulization every 6 (six) hours as needed for wheezing or shortness of breath.   albuterol (VENTOLIN HFA) 108 (90 Base) MCG/ACT inhaler Inhale 2 puffs into the lungs every 6 (six) hours as needed for wheezing or shortness of breath.   aspirin EC 81 MG EC tablet Take 1 tablet (81 mg total) by mouth daily.   BREZTRI AEROSPHERE 160-9-4.8 MCG/ACT AERO Inhale 2 puffs into the lungs in the morning and at bedtime.   Cholecalciferol (VITAMIN D) 2000 units CAPS Take 1 capsule (2,000 Units total) by mouth daily.   cyanocobalamin (VITAMIN B12) 1000 MCG tablet Take 1 tablet (1,000 mcg total) by mouth daily.   DULoxetine (CYMBALTA) 30 MG capsule Take 1 capsule (30 mg total) by mouth daily. Take a additional 60 mg for a total of 90 mg   DULoxetine (CYMBALTA) 60 MG capsule Take 1 capsule (60 mg total) by mouth daily. Take for an additional 30 mg for a total of 90 mg   Guaifenesin 200 MG/5ML LIQD Take 15 mLs (600 mg total) by mouth 3 (three) times daily as needed.   ipratropium-albuterol (DUONEB) 0.5-2.5 (3) MG/3ML SOLN Take 3 mLs by nebulization every 6 (six) hours as needed.   Lidocaine (ZTLIDO) 1.8 % PTCH Apply 1 patch topically daily.   metoprolol succinate (TOPROL-XL) 25 MG 24 hr tablet Take 0.5 tablets (12.5 mg total)  by mouth daily.   predniSONE (STERAPRED UNI-PAK 21 TAB) 10 MG (21) TBPK tablet 6 day taper - take by mouth as directed for 6 days   rosuvastatin (CRESTOR) 20 MG tablet Take 1 tablet (20 mg total) by mouth daily.   Spacer/Aero-Holding Chambers (AEROCHAMBER MV) inhaler Use as instructed   terbinafine (LAMISIL AT) 1 % cream Apply 1 Application topically 2 (two) times daily.   [DISCONTINUED] ALPRAZolam (XANAX) 1 MG tablet Take 1 tablet (1 mg total) by mouth 2 (two) times daily.   [DISCONTINUED] HYDROcodone-acetaminophen (NORCO) 7.5-325 MG tablet Take 1 tablet by mouth 2 (two) times daily as  needed.   [DISCONTINUED] meloxicam (MOBIC) 7.5 MG tablet Take 1 tablet by mouth once daily   [DISCONTINUED] ciprofloxacin (CIPRO) 500 MG tablet Take 1 tablet (500 mg total) by mouth 2 (two) times daily. (Patient not taking: Reported on 06/22/2022)   No facility-administered medications prior to visit.    Review of Systems See HPI      Objective     Today's Vitals   06/22/22 1434  BP: 119/75  Pulse: 75  SpO2: 95%  Weight: 139 lb 6.4 oz (63.2 kg)  Height: '5\' 8"'$  (1.727 m)   Body mass index is 21.2 kg/m.  BP Readings from Last 3 Encounters:  06/22/22 119/75  05/03/22 130/80  03/16/22 111/72    Wt Readings from Last 3 Encounters:  06/22/22 139 lb 6.4 oz (63.2 kg)  05/03/22 139 lb (63 kg)  03/16/22 137 lb (62.1 kg)    Physical Exam Vitals and nursing note reviewed.  Constitutional:      Appearance: Normal appearance. She is well-developed.  HENT:     Head: Normocephalic and atraumatic.     Nose: Nose normal.     Mouth/Throat:     Mouth: Mucous membranes are moist.     Pharynx: Oropharynx is clear.  Eyes:     Extraocular Movements: Extraocular movements intact.     Conjunctiva/sclera: Conjunctivae normal.     Pupils: Pupils are equal, round, and reactive to light.  Cardiovascular:     Rate and Rhythm: Normal rate and regular rhythm.     Pulses: Normal pulses.     Heart sounds: Normal heart sounds.  Pulmonary:     Effort: Pulmonary effort is normal.     Breath sounds: Normal breath sounds.     Comments: Chronic, deep, congested cough. Clear and diminished breath sounds, bilaterally  Abdominal:     Palpations: Abdomen is soft.  Genitourinary:    Comments: Urine sample positive for small WBC  Musculoskeletal:        General: Normal range of motion.     Cervical back: Normal range of motion and neck supple.     Comments:   Chronic lower back pain. Worse with movement.     Lymphadenopathy:     Cervical: No cervical adenopathy.  Skin:    General: Skin is warm and  dry.     Capillary Refill: Capillary refill takes less than 2 seconds.  Neurological:     General: No focal deficit present.     Mental Status: She is alert and oriented to person, place, and time.  Psychiatric:        Mood and Affect: Mood normal.        Behavior: Behavior normal.        Thought Content: Thought content normal.        Judgment: Judgment normal.     Results for orders placed or performed  in visit on 06/22/22  Urine Culture   Specimen: Urine   Urine  Result Value Ref Range   Urine Culture, Routine Final report (A)    Organism ID, Bacteria Klebsiella pneumoniae (A)    Antimicrobial Susceptibility Comment   POCT URINALYSIS DIP (CLINITEK)  Result Value Ref Range   Color, UA yellow yellow   Clarity, UA clear clear   Glucose, UA negative negative mg/dL   Bilirubin, UA negative negative   Ketones, POC UA negative negative mg/dL   Spec Grav, UA 1.025 1.010 - 1.025   Blood, UA negative negative   pH, UA 7.0 5.0 - 8.0   POC PROTEIN,UA negative negative, trace   Urobilinogen, UA 1.0 0.2 or 1.0 E.U./dL   Nitrite, UA Negative Negative   Leukocytes, UA Small (1+) (A) Negative    Assessment & Plan    1. Urinary tract infection with hematuria, site unspecified Start cipro 500 mg twice daily for 7 days. Send urine for culture and sensitivity and adjust treatment as indicated  - POCT URINALYSIS DIP (CLINITEK) - Urine Culture; Future - Urine Culture - ciprofloxacin (CIPRO) 500 MG tablet; Take 1 tablet (500 mg total) by mouth 2 (two) times daily.  Dispense: 14 tablet; Refill: 0  2. Generalized anxiety disorder May continue to take alprazolam 1 mg up to twice daily as needed for acute anxiety. Fill history is appropriate. She is only taking as needed.  - ALPRAZolam (XANAX) 1 MG tablet; Take 1 tablet (1 mg total) by mouth 2 (two) times daily.  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 Fill history is appropriate.  - HYDROcodone-acetaminophen (NORCO) 7.5-325 MG tablet; Take 1 tablet by mouth 2 (two) times daily as needed.  Dispense: 60 tablet; Refill: 0  4. COPD without exacerbation ([place]) Continue inhalers and respiratory  medication as prescribed. Keep routine appointments with pulmonology.    Problem List Items Addressed This Visit       Respiratory   COPD with exacerbation (HCC)   Relevant Medications   ciprofloxacin (CIPRO) 500 MG tablet     Nervous and Auditory   Chronic midline low back pain with bilateral sciatica   Relevant Medications   ALPRAZolam (XANAX) 1 MG tablet   HYDROcodone-acetaminophen (NORCO) 7.5-325 MG tablet     Genitourinary   Urinary tract infection with hematuria - Primary   Relevant Medications   ciprofloxacin (CIPRO) 500 MG tablet   Other Relevant Orders   POCT URINALYSIS DIP (CLINITEK) (Completed)   Urine Culture (Completed)     Other   Generalized anxiety disorder   Relevant Medications   ALPRAZolam (XANAX) 1 MG tablet     Return in about 3 months (around 09/20/2022) for med refills.         Ronnell Freshwater, NP  [facility] at [facility] [phone] (phone) [phone] (fax)  [place]

## 2022-06-26 LAB — URINE CULTURE

## 2022-07-04 ENCOUNTER — Other Ambulatory Visit: Payer: Self-pay | Admitting: Nurse Practitioner

## 2022-07-04 DIAGNOSIS — G8929 Other chronic pain: Secondary | ICD-10-CM

## 2022-07-04 NOTE — Telephone Encounter (Signed)
L.O.V: 06/22/22  N.O.V: 09/20/22  L.R.F: 06/06/22 Meloxicam 30 tab 0 refill

## 2022-07-24 ENCOUNTER — Other Ambulatory Visit: Payer: Self-pay | Admitting: Acute Care

## 2022-07-24 DIAGNOSIS — Z87891 Personal history of nicotine dependence: Secondary | ICD-10-CM

## 2022-07-24 DIAGNOSIS — Z122 Encounter for screening for malignant neoplasm of respiratory organs: Secondary | ICD-10-CM

## 2022-07-26 ENCOUNTER — Encounter: Payer: Self-pay | Admitting: Podiatry

## 2022-07-26 ENCOUNTER — Ambulatory Visit (INDEPENDENT_AMBULATORY_CARE_PROVIDER_SITE_OTHER): Payer: 59 | Admitting: Podiatry

## 2022-07-26 VITALS — BP 117/72

## 2022-07-26 DIAGNOSIS — B351 Tinea unguium: Secondary | ICD-10-CM | POA: Diagnosis not present

## 2022-07-26 DIAGNOSIS — S91312A Laceration without foreign body, left foot, initial encounter: Secondary | ICD-10-CM

## 2022-07-26 DIAGNOSIS — S91311A Laceration without foreign body, right foot, initial encounter: Secondary | ICD-10-CM | POA: Diagnosis not present

## 2022-07-26 DIAGNOSIS — M79675 Pain in left toe(s): Secondary | ICD-10-CM

## 2022-07-26 DIAGNOSIS — M79674 Pain in right toe(s): Secondary | ICD-10-CM | POA: Diagnosis not present

## 2022-07-26 DIAGNOSIS — G629 Polyneuropathy, unspecified: Secondary | ICD-10-CM

## 2022-07-26 NOTE — Progress Notes (Unsigned)
  Subjective:  Patient ID: Vickie Bowman, female    DOB: 15-Aug-1950,  MRN: [id number]  Vickie Bowman presents to clinic today for {jgcomplaint:23593}  Chief Complaint  Patient presents with   Nail Problem    RFC PCP-Bowman, Vickie PCP VST- 2 months ago   New problem(s): None. {jgcomplaint:23593}  PCP is Vickie Freshwater, NP.  Allergies  Allergen Reactions   Penicillins Swelling    Has patient had a PCN reaction causing immediate rash, facial/tongue/throat swelling, SOB or lightheadedness with hypotension: Yes Has patient had a PCN reaction causing severe rash involving mucus membranes or skin necrosis: No Has patient had a PCN reaction that required hospitalization: No Has patient had a PCN reaction occurring within the last 10 years: No If all of the above answers are "NO", then may proceed with Cephalosporin use.     Review of Systems: Negative except as noted in the HPI.  Objective:  Vitals:   07/26/22 1344  BP: 117/72   Vickie Bowman is a pleasant 72 y.o. female WD, WN in NAD. AAO x 3. Vascular CFT <3 seconds b/l LE. Palpable DP/PT pulses b/l LE. Digital hair sparse b/l. Skin temperature gradient WNL b/l. No pain with calf compression b/l. No edema noted b/l.  Neurologic Normal speech. Oriented to person, place, and time. Pt has subjective symptoms of neuropathy. Protective sensation intact 5/5 intact bilaterally with 10g monofilament b/l. Vibratory sensation intact b/l.  Dermatologic Ecchymosis and edema noted left 2nd digit proximal to DIPJ.  No open wounds b/l. No interdigital macerations b/l. Toenails 1-5 b/l elongated, thickened, discolored with subungual debris. +Tenderness with dorsal palpation of nailplates. No hyperkeratotic or porokeratotic lesions present.  Orthopedic: HAV with bunion deformity noted b/l LE. Severe hammertoe deformity noted R 2nd toe. No evidence of interdigital lesions bilateral great toes and bilateral 2nd toes.   Assessment/Plan: 1.  Pain due to onychomycosis of toenails of both feet     No orders of the defined types were placed in this encounter.   None {Jgplan:23602::"-Patient/POA to call should there be question/concern in the interim."}   Return in about 3 months (around 10/26/2022).  Vickie Bowman, DPM

## 2022-08-01 ENCOUNTER — Other Ambulatory Visit: Payer: Self-pay

## 2022-08-10 ENCOUNTER — Other Ambulatory Visit: Payer: Self-pay

## 2022-08-10 DIAGNOSIS — G6289 Other specified polyneuropathies: Secondary | ICD-10-CM

## 2022-08-10 MED ORDER — DULOXETINE HCL 60 MG PO CPEP: 60.0000 mg | ORAL_CAPSULE | Freq: Every day | ORAL | 3 refills | Status: DC

## 2022-09-12 ENCOUNTER — Other Ambulatory Visit: Payer: 59

## 2022-09-13 ENCOUNTER — Telehealth: Payer: Self-pay | Admitting: Internal Medicine

## 2022-09-13 NOTE — Telephone Encounter (Signed)
Pt c/o medication issue:  1. Name of Medication:   rosuvastatin (CRESTOR) 20 MG tablet    2. How are you currently taking this medication (dosage and times per day)? As prescribed   3. Are you having a reaction (difficulty breathing--STAT)? No  4. What is your medication issue? Patient is requesting call back to discuss why she is taking this medication. Patient states she was on this medication once before, but was taken off due to elevated numbers for liver. She would like to know if this is still okay to take. Please advise.

## 2022-09-13 NOTE — Telephone Encounter (Signed)
  Gave patient information as last OV .Marland KitchenMarland Kitchen  Vickie Bowman stopped her statin because of leg pain but is noted to have peripheral neuropathy.  Her symptoms did not change but she did not notify us and she has been off of her statin for quite a while.  Her lipids have been well controlled and her liver enzymes were better on rosuvastatin 20 mg daily.  I will prescribe her to restart that medicine we will plan repeat lipids and liver enzymes in about 3 months.  Follow-up with me otherwise annually or sooner as necessary.   She states understanding and will take the mediation as ordered. She states wanting an appt. With Dr Rennis Golden as not been seen in a year.  Advised his schedule is full for quite sometime but she can always see a NP.  I advised I would have scheduling to reach out to her to schedule.  She states understanding.

## 2022-09-14 NOTE — Telephone Encounter (Signed)
Pt is on waitlist to see Dr. Rennis Golden - [place] [facility]

## 2022-09-20 ENCOUNTER — Ambulatory Visit: Payer: 59 | Admitting: Nurse Practitioner

## 2022-09-21 ENCOUNTER — Ambulatory Visit: Payer: 59 | Admitting: Nurse Practitioner

## 2022-09-28 ENCOUNTER — Telehealth: Payer: Self-pay | Admitting: *Deleted

## 2022-09-28 DIAGNOSIS — G8929 Other chronic pain: Secondary | ICD-10-CM

## 2022-09-28 MED ORDER — HYDROCODONE-ACETAMINOPHEN 7.5-325 MG PO TABS: 1.0000 | ORAL_TABLET | Freq: Two times a day (BID) | ORAL | 0 refills | Status: DC | PRN

## 2022-09-28 NOTE — Telephone Encounter (Signed)
I just resent the prescription so that they are able to fill it today.  Originally sent on 06/22/2022 by Vincent Gros NP as follows:  HYDROcodone-acetaminophen (NORCO) 7.5-325 MG tablet [[id number]]  Dose: 1 tablet Route: Oral Frequency: 2 times daily PRN  Dispense Quantity: 60 tablet Refills: 0   Note to Pharmacy: Ok to fill when patient needs to have refilled. Please file and fill when patient needs  Indications of Use: Pain       Sig: Take 1 tablet by mouth 2 (two) times daily as needed.       Start Date: 06/22/22 End Date: --  Written Date: 06/22/22 Expiration Date: 12/19/22  Earliest Fill Date: 06/22/22       Associated Diagnoses: Chronic midline low back pain with bilateral sciatica [M54.41, M54.42, G89.29]    Order Questions  Question Answer Comment  Supervising Provider Nani Gasser D       Appointment Staff  Staff Department  Carlean Jews, NP [facility]    Providers  Authorizing Provider: Carlean Jews, NP NPI: [id number]  Supervising Provider: Agapito Games, MD NPI: [id number]  Ordering User: Carlean Jews, NP        Pharmacy  [facility] [address] [place], [place] - [address], [place] [address] Phone: [phone]  Fax: [phone]

## 2022-09-28 NOTE — Telephone Encounter (Signed)
Pt calling requesting refill on below.  Confirmed with pharmacy that there was no Rx on file for this.  Routing to provider in office due to PCP being on vacation.    HYDROcodone-acetaminophen (NORCO) 7.5-325 MG tablet     [address] [place], [place] - [address]    LOV 06/22/2022 ROV 10/26/22

## 2022-09-29 ENCOUNTER — Ambulatory Visit
Admission: RE | Admit: 2022-09-29 | Discharge: 2022-09-29 | Disposition: A | Discharge status: Home / Self Care | Payer: 59 | Source: Ambulatory Visit | Attending: Acute Care | Admitting: Acute Care

## 2022-09-29 DIAGNOSIS — Z122 Encounter for screening for malignant neoplasm of respiratory organs: Secondary | ICD-10-CM | POA: Diagnosis not present

## 2022-09-29 DIAGNOSIS — J439 Emphysema, unspecified: Secondary | ICD-10-CM | POA: Diagnosis not present

## 2022-09-29 DIAGNOSIS — Z87891 Personal history of nicotine dependence: Secondary | ICD-10-CM | POA: Diagnosis not present

## 2022-09-29 DIAGNOSIS — I700 Atherosclerosis of aorta: Secondary | ICD-10-CM | POA: Diagnosis not present

## 2022-10-04 ENCOUNTER — Other Ambulatory Visit: Payer: Self-pay

## 2022-10-04 DIAGNOSIS — Z87891 Personal history of nicotine dependence: Secondary | ICD-10-CM

## 2022-10-05 ENCOUNTER — Ambulatory Visit: Payer: 59 | Admitting: Nurse Practitioner

## 2022-10-05 ENCOUNTER — Other Ambulatory Visit: Payer: Self-pay | Admitting: Nurse Practitioner

## 2022-10-05 DIAGNOSIS — F411 Generalized anxiety disorder: Secondary | ICD-10-CM

## 2022-10-11 ENCOUNTER — Other Ambulatory Visit: Payer: Self-pay | Admitting: Nurse Practitioner

## 2022-10-11 DIAGNOSIS — G8929 Other chronic pain: Secondary | ICD-10-CM

## 2022-10-12 ENCOUNTER — Other Ambulatory Visit: Payer: Self-pay | Admitting: Nurse Practitioner

## 2022-10-12 DIAGNOSIS — G8929 Other chronic pain: Secondary | ICD-10-CM

## 2022-10-16 ENCOUNTER — Other Ambulatory Visit (HOSPITAL_BASED_OUTPATIENT_CLINIC_OR_DEPARTMENT_OTHER): Payer: Self-pay | Admitting: Internal Medicine

## 2022-10-25 ENCOUNTER — Other Ambulatory Visit: Payer: Self-pay | Admitting: Nurse Practitioner

## 2022-10-25 ENCOUNTER — Telehealth: Payer: Self-pay | Admitting: Internal Medicine

## 2022-10-25 DIAGNOSIS — G8929 Other chronic pain: Secondary | ICD-10-CM

## 2022-10-25 DIAGNOSIS — E782 Mixed hyperlipidemia: Secondary | ICD-10-CM

## 2022-10-25 NOTE — Telephone Encounter (Signed)
Patient would like to have any labs that Dr. Rennis Golden suggests having ordered. He wants to have them drawn same day as 10/04 appointment with Dr. Rennis Golden.

## 2022-10-25 NOTE — Telephone Encounter (Signed)
Patient had CMET and lipid ordered previously - not completed. Has known CAD. No LPa in the chart. Will await MD input on lab orders and then mail to patient.

## 2022-10-25 NOTE — Telephone Encounter (Signed)
Please advise which labs would like drawn prior to appt 10/4 at 3:00.  If fasting Labs, or would like sooner than appt. I will advise on different date to have done

## 2022-10-26 ENCOUNTER — Ambulatory Visit (INDEPENDENT_AMBULATORY_CARE_PROVIDER_SITE_OTHER): Payer: 59 | Admitting: Nurse Practitioner

## 2022-10-26 ENCOUNTER — Encounter: Payer: Self-pay | Admitting: Nurse Practitioner

## 2022-10-26 VITALS — BP 114/76 | HR 82 | Ht 68.0 in | Wt 139.1 lb

## 2022-10-26 DIAGNOSIS — J449 Chronic obstructive pulmonary disease, unspecified: Secondary | ICD-10-CM | POA: Diagnosis not present

## 2022-10-26 DIAGNOSIS — I2510 Atherosclerotic heart disease of native coronary artery without angina pectoris: Secondary | ICD-10-CM | POA: Diagnosis not present

## 2022-10-26 DIAGNOSIS — M5442 Lumbago with sciatica, left side: Secondary | ICD-10-CM

## 2022-10-26 DIAGNOSIS — R319 Hematuria, unspecified: Secondary | ICD-10-CM

## 2022-10-26 DIAGNOSIS — G8929 Other chronic pain: Secondary | ICD-10-CM

## 2022-10-26 DIAGNOSIS — M5441 Lumbago with sciatica, right side: Secondary | ICD-10-CM | POA: Diagnosis not present

## 2022-10-26 DIAGNOSIS — N390 Urinary tract infection, site not specified: Secondary | ICD-10-CM

## 2022-10-26 DIAGNOSIS — F411 Generalized anxiety disorder: Secondary | ICD-10-CM

## 2022-10-26 DIAGNOSIS — J441 Chronic obstructive pulmonary disease with (acute) exacerbation: Secondary | ICD-10-CM

## 2022-10-26 MED ORDER — METOPROLOL SUCCINATE ER 25 MG PO TB24
12.5000 mg | ORAL_TABLET | Freq: Every day | ORAL | 1 refills | Status: DC
Start: 2022-10-26 — End: 2023-08-14

## 2022-10-26 MED ORDER — MELOXICAM 7.5 MG PO TABS: 7.5000 mg | ORAL_TABLET | Freq: Every day | ORAL | 1 refills | Status: DC

## 2022-10-26 MED ORDER — HYDROCODONE-ACETAMINOPHEN 7.5-325 MG PO TABS
1.0000 | ORAL_TABLET | Freq: Two times a day (BID) | ORAL | 0 refills | Status: DC | PRN
Start: 2022-10-26 — End: 2022-12-16

## 2022-10-26 MED ORDER — ALPRAZOLAM 1 MG PO TABS
1.0000 mg | ORAL_TABLET | Freq: Two times a day (BID) | ORAL | 2 refills | Status: DC
Start: 2022-10-26 — End: 2023-01-26

## 2022-10-26 MED ORDER — SULFAMETHOXAZOLE-TRIMETHOPRIM 400-80 MG PO TABS
1.0000 | ORAL_TABLET | Freq: Two times a day (BID) | ORAL | 0 refills | Status: DC
Start: 2022-10-26 — End: 2023-01-26

## 2022-10-26 NOTE — Progress Notes (Signed)
Established patient visit   Patient: Vickie Bowman   DOB: 1951/01/06   72 y.o. Female  MRN: [id number] Visit Date: 10/26/2022   Chief Complaint  Patient presents with   Medical Management of Chronic Issues   Subjective    HPI  Having some concern for UTI.  -states this comes and goes  -does have some burning with urination but not every time.  -was given prescription for low dose cipro at her most recent visit.   -chronic pain. --in process of weaning down narcotic pain medication as she was on very high and frequent dosing of narcotic pain medication   --She has been successfully weaned to hydrocodone/APAP 7.5/325 mg, using 1 tablet hydrocodone/APAP 1 time, maybe 2 times daily, and only if needed.  She was given a single prescription for 60 tablets sent to her pharmacy.  We have discussed the plan to continue cutting back dosing.   She did sign a controlled substances agreement policy at her last visit.  -she does continue to take alprazolam in the morning. Sometimes she does need to take one at night.  We decided to keep this prescription the same and will wean this after successful wean from narcotics. She has done well with the reduction in dosing. She states that this dosing regimen is doing well for her.  -she continues to take alprazolam 1 mg twice daily when needed.  -PDMP profile was reviewed 10/26/2022 -unintentional overdose risk score is below average at 060 with no red flags. Fill history is appropriate.  -last fill of hydrocodone was 09/30/2022 -last fill of alprazolam 10/06/2022  -She denies chest pain, chest pressure, or shortness of breath. She denies headaches or visual disturbances. She denies abdominal pain, nausea, vomiting, or changes in bowel or bladder habits.     Medications: Outpatient Medications Prior to Visit  Medication Sig   albuterol (PROVENTIL) (2.5 MG/3ML) 0.083% nebulizer solution Take 3 mLs (2.5 mg total) by nebulization every 6 (six) hours as needed  for wheezing or shortness of breath.   albuterol (VENTOLIN HFA) 108 (90 Base) MCG/ACT inhaler Inhale 2 puffs into the lungs every 6 (six) hours as needed for wheezing or shortness of breath.   aspirin EC 81 MG EC tablet Take 1 tablet (81 mg total) by mouth daily.   BREZTRI AEROSPHERE 160-9-4.8 MCG/ACT AERO Inhale 2 puffs into the lungs in the morning and at bedtime.   Cholecalciferol (VITAMIN D) 2000 units CAPS Take 1 capsule (2,000 Units total) by mouth daily.   ciprofloxacin (CIPRO) 500 MG tablet Take 1 tablet (500 mg total) by mouth 2 (two) times daily. (Patient not taking: Reported on 11/09/2022)   cyanocobalamin (VITAMIN B12) 1000 MCG tablet Take 1 tablet (1,000 mcg total) by mouth daily.   DULoxetine (CYMBALTA) 30 MG capsule Take 1 capsule (30 mg total) by mouth daily. Take a additional 60 mg for a total of 90 mg   DULoxetine (CYMBALTA) 60 MG capsule Take 1 capsule (60 mg total) by mouth daily. Take for an additional 30 mg for a total of 90 mg   ipratropium-albuterol (DUONEB) 0.5-2.5 (3) MG/3ML SOLN Take 3 mLs by nebulization every 6 (six) hours as needed.   predniSONE (STERAPRED UNI-PAK 21 TAB) 10 MG (21) TBPK tablet 6 day taper - take by mouth as directed for 6 days (Patient not taking: Reported on 11/09/2022)   rosuvastatin (CRESTOR) 20 MG tablet Take 1 tablet (20 mg total) by mouth daily. NEED OV. (Patient not taking: Reported on 11/09/2022)  Spacer/Aero-Holding Chambers (AEROCHAMBER MV) inhaler Use as instructed   terbinafine (LAMISIL AT) 1 % cream Apply 1 Application topically 2 (two) times daily.   [DISCONTINUED] ALPRAZolam (XANAX) 1 MG tablet Take 1 tablet by mouth twice daily   [DISCONTINUED] HYDROcodone-acetaminophen (NORCO) 7.5-325 MG tablet Take 1 tablet by mouth 2 (two) times daily as needed.   [DISCONTINUED] Lidocaine (ZTLIDO) 1.8 % PTCH Apply 1 patch topically daily.   [DISCONTINUED] meloxicam (MOBIC) 7.5 MG tablet Take 1 tablet by mouth once daily   [DISCONTINUED] metoprolol  succinate (TOPROL-XL) 25 MG 24 hr tablet Take 0.5 tablets (12.5 mg total) by mouth daily.   [DISCONTINUED] Guaifenesin 200 MG/5ML LIQD Take 15 mLs (600 mg total) by mouth 3 (three) times daily as needed.   No facility-administered medications prior to visit.    Review of Systems See HPI      Objective     Today's Vitals   10/26/22 1338  BP: 114/76  Pulse: 82  SpO2: 97%  Weight: 139 lb 1.9 oz (63.1 kg)  Height: 5\' 8"  (1.727 m)   Body mass index is 21.15 kg/m.  BP Readings from Last 3 Encounters:  11/09/22 110/64  11/08/22 110/70  11/07/22 98/68    Wt Readings from Last 3 Encounters:  11/09/22 137 lb (62.1 kg)  11/08/22 138 lb (62.6 kg)  11/07/22 138 lb 6.4 oz (62.8 kg)    Physical Exam Vitals and nursing note reviewed.  Constitutional:      Appearance: Normal appearance. She is well-developed.  HENT:     Head: Normocephalic and atraumatic.     Nose: Nose normal.     Mouth/Throat:     Mouth: Mucous membranes are moist.     Pharynx: Oropharynx is clear.  Eyes:     Extraocular Movements: Extraocular movements intact.     Conjunctiva/sclera: Conjunctivae normal.     Pupils: Pupils are equal, round, and reactive to light.  Neck:     Vascular: No carotid bruit.  Cardiovascular:     Rate and Rhythm: Normal rate and regular rhythm.     Pulses: Normal pulses.     Heart sounds: Normal heart sounds.  Pulmonary:     Effort: Pulmonary effort is normal.     Breath sounds: Rhonchi present.     Comments: On constant nasal cannula oxygen  Abdominal:     Palpations: Abdomen is soft.  Musculoskeletal:        General: Normal range of motion.     Cervical back: Normal range of motion and neck supple.  Lymphadenopathy:     Cervical: No cervical adenopathy.  Skin:    General: Skin is warm and dry.     Capillary Refill: Capillary refill takes less than 2 seconds.  Neurological:     General: No focal deficit present.     Mental Status: She is alert and oriented to person,  place, and time.  Psychiatric:        Mood and Affect: Mood normal.        Behavior: Behavior normal.        Thought Content: Thought content normal.        Judgment: Judgment normal.      Assessment & Plan    Urinary tract infection with hematuria, site unspecified Assessment & Plan: Patient unable to void today -reviewed u/a and culture results from most recent check.  -will start bactrim single strength, twice daily for 7 days.  -advised her to contact the office if symptoms do not  improve. She voiced agreement and understanding .  Orders: -     Sulfamethoxazole-Trimethoprim; Take 1 tablet by mouth 2 (two) times daily. (Patient not taking: Reported on 11/09/2022)  Dispense: 14 tablet; Refill: 0  COPD without exacerbation (HCC) Assessment & Plan: Stable.  -continue inhalers and respiratory medications as prescribed  -continue with nasal cannula oxygen as prescribed  -follow up with pulmonology as scheduled.    Coronary artery disease involving native coronary artery of native heart without angina pectoris Assessment & Plan: Stable.  -continue regular visits with cardiology as scheduled.   Orders: -     Metoprolol Succinate ER; Take 0.5 tablets (12.5 mg total) by mouth daily.  Dispense: 90 tablet; Refill: 1  Chronic midline low back pain with bilateral sciatica Assessment & Plan: Take meloxicam 7.5 mg daily to reduce pain and inflammation  May take hydrocodone/APAP 7.5/325 mg up to twice daily if needed for severe pain.  -new prescription sent to her pharmacy   Orders: -     Meloxicam; Take 1 tablet (7.5 mg total) by mouth daily.  Dispense: 90 tablet; Refill: 1 -     HYDROcodone-Acetaminophen; Take 1 tablet by mouth 2 (two) times daily as needed.  Dispense: 60 tablet; Refill: 0  Generalized anxiety disorder Assessment & Plan: May continue to take alprazolam as needed and as previously prescribed  -new prescription sent to pharmacy   Orders: -     ALPRAZolam; Take 1  tablet (1 mg total) by mouth 2 (two) times daily.  Dispense: 60 tablet; Refill: 2     Return in about 3 months (around 01/26/2023) for mood, pain control - needs MWV after 11/21/2022 .         Carlean Jews, NP  [facility] at [facility] [phone] (phone) [phone] (fax)  [facility]

## 2022-10-27 ENCOUNTER — Ambulatory Visit (INDEPENDENT_AMBULATORY_CARE_PROVIDER_SITE_OTHER): Payer: 59

## 2022-10-27 ENCOUNTER — Telehealth: Payer: Self-pay | Admitting: Internal Medicine

## 2022-10-27 VITALS — Ht 69.6 in | Wt 139.0 lb

## 2022-10-27 DIAGNOSIS — Z1159 Encounter for screening for other viral diseases: Secondary | ICD-10-CM

## 2022-10-27 DIAGNOSIS — Z0000 Encounter for general adult medical examination without abnormal findings: Secondary | ICD-10-CM | POA: Diagnosis not present

## 2022-10-27 NOTE — Progress Notes (Signed)
Subjective:   Vickie Bowman is a 72 y.o. female who presents for Medicare Annual (Subsequent) preventive examination.  Review of Systems    Virtual Visit via Telephone Note  I connected with  [name] on 10/27/22 at  1:00 PM EDT by telephone and verified that I am speaking with the correct person using two identifiers.  Location: Patient: Home Provider: Office Persons participating in the virtual visit: patient/Nurse Health Advisor   I discussed the limitations, risks, security and privacy concerns of performing an evaluation and management service by telephone and the availability of in person appointments. The patient expressed understanding and agreed to proceed.  Interactive audio and video telecommunications were attempted between this nurse and patient, however failed, due to patient having technical difficulties OR patient did not have access to video capability.  We continued and completed visit with audio only.  Some vital signs may be absent or patient reported.   [name], LPN  Cardiac Risk Factors include: advanced age (>78men, >4 women);Other (see comment), Risk factor comments: Dx: COPD     Objective:    Today's Vitals   10/27/22 1306  Weight: 139 lb (63 kg)  Height: 5' 9.6" (1.768 m)   Body mass index is 20.17 kg/m.     10/27/2022    1:17 PM 03/05/2018    2:07 AM 03/04/2018    9:43 PM 02/01/2018   10:58 PM  Advanced Directives  Does Patient Have a Medical Advance Directive? Yes No No No  Type of Estate agent of [place];Living will     Does patient want to make changes to medical advance directive? No - Patient declined     Copy of Healthcare Power of Attorney in Chart? Yes - validated most recent copy scanned in chart (See row information)     Would patient like information on creating a medical advance directive?  No - Patient declined No - Patient declined No - Patient declined    Current Medications  (verified) Outpatient Encounter Medications as of 10/27/2022  Medication Sig   albuterol (PROVENTIL) (2.5 MG/3ML) 0.083% nebulizer solution Take 3 mLs (2.5 mg total) by nebulization every 6 (six) hours as needed for wheezing or shortness of breath.   albuterol (VENTOLIN HFA) 108 (90 Base) MCG/ACT inhaler Inhale 2 puffs into the lungs every 6 (six) hours as needed for wheezing or shortness of breath.   ALPRAZolam (XANAX) 1 MG tablet Take 1 tablet (1 mg total) by mouth 2 (two) times daily.   aspirin EC 81 MG EC tablet Take 1 tablet (81 mg total) by mouth daily.   BREZTRI AEROSPHERE 160-9-4.8 MCG/ACT AERO Inhale 2 puffs into the lungs in the morning and at bedtime.   Cholecalciferol (VITAMIN D) 2000 units CAPS Take 1 capsule (2,000 Units total) by mouth daily.   ciprofloxacin (CIPRO) 500 MG tablet Take 1 tablet (500 mg total) by mouth 2 (two) times daily.   cyanocobalamin (VITAMIN B12) 1000 MCG tablet Take 1 tablet (1,000 mcg total) by mouth daily.   DULoxetine (CYMBALTA) 30 MG capsule Take 1 capsule (30 mg total) by mouth daily. Take a additional 60 mg for a total of 90 mg   DULoxetine (CYMBALTA) 60 MG capsule Take 1 capsule (60 mg total) by mouth daily. Take for an additional 30 mg for a total of 90 mg   HYDROcodone-acetaminophen (NORCO) 7.5-325 MG tablet Take 1 tablet by mouth 2 (two) times daily as needed.   ipratropium-albuterol (DUONEB) 0.5-2.5 (3) MG/3ML  SOLN Take 3 mLs by nebulization every 6 (six) hours as needed.   Lidocaine (ZTLIDO) 1.8 % PTCH Apply 1 patch topically daily.   meloxicam (MOBIC) 7.5 MG tablet Take 1 tablet (7.5 mg total) by mouth daily.   metoprolol succinate (TOPROL-XL) 25 MG 24 hr tablet Take 0.5 tablets (12.5 mg total) by mouth daily.   predniSONE (STERAPRED UNI-PAK 21 TAB) 10 MG (21) TBPK tablet 6 day taper - take by mouth as directed for 6 days   rosuvastatin (CRESTOR) 20 MG tablet Take 1 tablet (20 mg total) by mouth daily. NEED OV.   Spacer/Aero-Holding Chambers  (AEROCHAMBER MV) inhaler Use as instructed   sulfamethoxazole-trimethoprim (BACTRIM) 400-80 MG tablet Take 1 tablet by mouth 2 (two) times daily.   terbinafine (LAMISIL AT) 1 % cream Apply 1 Application topically 2 (two) times daily.   No facility-administered encounter medications on file as of 10/27/2022.    Allergies (verified) Penicillins   History: Past Medical History:  Diagnosis Date   Anxiety    CHF (congestive heart failure) (HCC)    COPD (chronic obstructive pulmonary disease) (HCC)    Lumbar herniated disc    Tobacco abuse    Past Surgical History:  Procedure Laterality Date   LEFT HEART CATH AND CORONARY ANGIOGRAPHY N/A 02/05/2018   Procedure: LEFT HEART CATH AND CORONARY ANGIOGRAPHY;  Surgeon: Yvonne Kendall, MD;  Location: [facility]VASIVE [facility];  Service: Cardiovascular;  Laterality: N/A;   Family History  Problem Relation Age of Onset   Liver cancer Mother    Breast cancer Sister    CAD Neg Hx    Social History   Socioeconomic History   Marital status: Widowed    Spouse name: Not on file   Number of children: Not on file   Years of education: Not on file   Highest education level: Not on file  Occupational History   Not on file  Tobacco Use   Smoking status: Former    Packs/day: 1.50    Years: 53.00    Additional pack years: 0.00    Total pack years: 79.50    Types: Cigarettes    Start date: 05/24/1967    Quit date: 01/07/2018    Years since quitting: 4.8   Smokeless tobacco: Never   Tobacco comments:    Smoked since age 61, quit 12/2017  Vaping Use   Vaping Use: Never used  Substance and Sexual Activity   Alcohol use: Yes    Comment: Rare, glass of wine no more than every few weeks   Drug use: Never   Sexual activity: Not Currently  Other Topics Concern   Not on file  Social History Narrative   Not on file   Social Determinants of Health   Financial Resource Strain: Low Risk  (10/27/2022)   Overall Financial Resource Strain (CARDIA)     Difficulty of Paying Living Expenses: Not hard at all  Food Insecurity: No Food Insecurity (10/27/2022)   Hunger Vital Sign    Worried About Running Out of Food in the Last Year: Never true    Ran Out of Food in the Last Year: Never true  Transportation Needs: No Transportation Needs (10/27/2022)   PRAPARE - Administrator, Civil Service (Medical): No    Lack of Transportation (Non-Medical): No  Physical Activity: Insufficiently Active (10/27/2022)   Exercise Vital Sign    Days of Exercise per Week: 2 days    Minutes of Exercise per Session: 20 min  Stress:  No Stress Concern Present (10/27/2022)   [employer] of Occupational Health - Occupational Stress Questionnaire    Feeling of Stress : Not at all  Social Connections: Socially Isolated (10/27/2022)   Social Connection and Isolation Panel [NHANES]    Frequency of Communication with Friends and Family: More than three times a week    Frequency of Social Gatherings with Friends and Family: More than three times a week    Attends Religious Services: Never    Database administrator or Organizations: No    Attends Banker Meetings: Never    Marital Status: Widowed    Tobacco Counseling Counseling given: Not Answered Tobacco comments: Smoked since age 32, quit 12/2017   Clinical Intake:  Pre-visit preparation completed: No  Pain : No/denies pain     BMI - recorded: 20.17 Nutritional Status: BMI of 19-24  Normal Nutritional Risks: None Diabetes: No  How often do you need to have someone help you when you read instructions, pamphlets, or other written materials from your doctor or pharmacy?: 1 - Never  Diabetic?  No  Interpreter Needed?: No  Information entered by :: Theresa Mulligan LPN   Activities of Daily Living    10/27/2022    1:14 PM 06/22/2022    2:56 PM  In your present state of health, do you have any difficulty performing the following activities:  Hearing? 0 0  Vision? 0 0  Difficulty  concentrating or making decisions? 0 0  Walking or climbing stairs? 0 0  Dressing or bathing? 0 0  Doing errands, shopping? 0 0  Preparing Food and eating ? N   Using the Toilet? N   In the past six months, have you accidently leaked urine? N   Do you have problems with loss of bowel control? N   Managing your Medications? N   Managing your Finances? N   Housekeeping or managing your Housekeeping? N     Patient Care Team: Carlean Jews, NP as PCP - General (Family Medicine) Rennis Golden Lisette Abu, MD as PCP - Cardiology (Cardiology)  Indicate any recent Medical Services you may have received from other than [facility] providers in the past year (date may be approximate).     Assessment:   This is a routine wellness examination for Taressa.  Hearing/Vision screen Hearing Screening - Comments:: Denies hearing difficulties   Vision Screening - Comments:: Wears reading glasses - up to date with routine eye exams with  Dr Randon Goldsmith  Dietary issues and exercise activities discussed: Exercise limited by: cardiac condition(s)   Goals Addressed               This Visit's Progress     No current goals (pt-stated)         Depression Screen    10/27/2022    1:12 PM 10/26/2022    1:41 PM 06/22/2022    2:55 PM 03/16/2022    1:37 PM 02/15/2022    2:18 PM 01/13/2022    1:37 PM 11/24/2021    3:06 PM  PHQ 2/9 Scores  PHQ - 2 Score 0 0 0 0 0 0 0  PHQ- 9 Score 0 0 0 0 1 0 1    Fall Risk    10/27/2022    1:15 PM 06/22/2022    2:57 PM  Fall Risk   Falls in the past year? 0 1  Number falls in past yr: 0 1  Injury with Fall? 0 1  Risk for fall due  to : No Fall Risks   Follow up Falls prevention discussed Falls evaluation completed    FALL RISK PREVENTION PERTAINING TO THE HOME:  Any stairs in or around the home? Yes  If so, are there any without handrails? No  Home free of loose throw rugs in walkways, pet beds, electrical cords, etc? Yes  Adequate lighting in your home to reduce risk of  falls? Yes   ASSISTIVE DEVICES UTILIZED TO PREVENT FALLS:  Life alert? No  Use of a cane, walker or w/c? No  Grab bars in the bathroom? No  Shower chair or bench in shower? Yes Elevated toilet seat or a handicapped toilet? No   TIMED UP AND GO:  Was the test performed? No . Audio Visit  Cognitive Function:        10/27/2022    1:17 PM 11/09/2021    1:37 PM  6CIT Screen  What Year? 0 points 0 points  What month? 0 points 0 points  What time? 0 points 0 points  Count back from 20 0 points 0 points  Months in reverse 0 points 0 points  Repeat phrase 0 points 2 points  Total Score 0 points 2 points    Immunizations Immunization History  Administered Date(s) Administered   Fluad Quad(high Dose 65+) 02/22/2019, 03/19/2021, 02/15/2022   Influenza-Unspecified 03/21/2018   PFIZER(Purple Top)SARS-COV-2 Vaccination 11/23/2019, 11/30/2019   Pneumococcal Polysaccharide-23 06/21/2018   Tdap 11/24/2021    TDAP status: Up to date  Flu Vaccine status: Up to date  Pneumococcal vaccine status: Due, Education has been provided regarding the importance of this vaccine. Advised may receive this vaccine at local pharmacy or Health Dept. Aware to provide a copy of the vaccination record if obtained from local pharmacy or Health Dept. Verbalized acceptance and understanding.  Covid-19 vaccine status: Completed vaccines  Qualifies for Shingles Vaccine? Yes   Zostavax completed No   Shingrix Completed?: No.    Education has been provided regarding the importance of this vaccine. Patient has been advised to call insurance company to determine out of pocket expense if they have not yet received this vaccine. Advised may also receive vaccine at local pharmacy or Health Dept. Verbalized acceptance and understanding.  Screening Tests Health Maintenance  Topic Date Due   Hepatitis C Screening  Never done   MAMMOGRAM  Never done   Zoster Vaccines- Shingrix (1 of 2) Never done   Pneumonia  Vaccine 10+ Years old (2 of 2 - PCV) 06/22/2019   COVID-19 Vaccine (3 - 2023-24 season) 11/12/2022 (Originally 01/21/2022)   INFLUENZA VACCINE  12/22/2022   Lung Cancer Screening  09/29/2023   Medicare Annual Wellness (AWV)  10/27/2023   Fecal DNA (Cologuard)  11/05/2024   DTaP/Tdap/Td (2 - Td or Tdap) 11/25/2031   DEXA SCAN  Completed   HPV VACCINES  Aged Out    Health Maintenance  Health Maintenance Due  Topic Date Due   Hepatitis C Screening  Never done   MAMMOGRAM  Never done   Zoster Vaccines- Shingrix (1 of 2) Never done   Pneumonia Vaccine 59+ Years old (2 of 2 - PCV) 06/22/2019    Colorectal cancer screening: Type of screening: Cologuard. Completed 11/05/21. Repeat every 3 years  Mammogram status: Ordered Patient declined. Pt provided with contact info and advised to call to schedule appt.   Bone Density status: Completed 06/27/18. Results reflect: Bone density results: OSTEOPOROSIS. Repeat every   years.  Lung Cancer Screening: (Low Dose CT Chest recommended if  Age 50-80 years, 30 pack-year currently smoking OR have quit w/in 15years.) does qualify.   Lung Cancer Screening Referral: Ordered 10/04/22  Additional Screening:  Hepatitis C Screening: does qualify;  Deferred  Vision Screening: Recommended annual ophthalmology exams for early detection of glaucoma and other disorders of the eye. Is the patient up to date with their annual eye exam?  Yes  Who is the provider or what is the name of the office in which the patient attends annual eye exams? Dr Randon Goldsmith If pt is not established with a provider, would they like to be referred to a provider to establish care? No .   Dental Screening: Recommended annual dental exams for proper oral hygiene  Community Resource Referral / Chronic Care Management:  CRR required this visit?  No   CCM required this visit?  No      Plan:     I have personally reviewed and noted the following in the patient's chart:   Medical and  social history Use of alcohol, tobacco or illicit drugs  Current medications and supplements including opioid prescriptions. Patient is not currently taking opioid prescriptions. Functional ability and status Nutritional status Physical activity Advanced directives List of other physicians Hospitalizations, surgeries, and ER visits in previous 12 months Vitals Screenings to include cognitive, depression, and falls Referrals and appointments  In addition, I have reviewed and discussed with patient certain preventive protocols, quality metrics, and best practice recommendations. A written personalized care plan for preventive services as well as general preventive health recommendations were provided to patient.     [name], LPN   Nurse Notes: Patient due Hep-C Screening

## 2022-10-27 NOTE — Addendum Note (Signed)
Addended by: Saralyn Pilar on: 10/27/2022 01:51 PM   Modules accepted: Orders

## 2022-10-27 NOTE — Telephone Encounter (Signed)
CMET, NMR, LPa ordered - lab slip(s) mailed Called patient - made aware what labs are ordered and when do to these Advised will also note this on lab slip(s)

## 2022-10-27 NOTE — Telephone Encounter (Signed)
Patient would like to have an EKG done. Please advise

## 2022-10-27 NOTE — Patient Instructions (Addendum)
Vickie Bowman , Thank you for taking time to come for your Medicare Wellness Visit. I appreciate your ongoing commitment to your health goals. Please review the following plan we discussed and let me know if I can assist you in the future.   These are the goals we discussed:  Goals       No current goals (pt-stated)        This is a list of the screening recommended for you and due dates:  Health Maintenance  Topic Date Due   Hepatitis C Screening  Never done   Mammogram  Never done   Zoster (Shingles) Vaccine (1 of 2) Never done   Pneumonia Vaccine (2 of 2 - PCV) 06/22/2019   COVID-19 Vaccine (3 - 2023-24 season) 11/12/2022*   Flu Shot  12/22/2022   Screening for Lung Cancer  09/29/2023   Medicare Annual Wellness Visit  10/27/2023   Cologuard (Stool DNA test)  11/05/2024   DTaP/Tdap/Td vaccine (2 - Td or Tdap) 11/25/2031   DEXA scan (bone density measurement)  Completed   HPV Vaccine  Aged Out  *Topic was postponed. The date shown is not the original due date.    Advanced directives: In Chart  Conditions/risks identified: None  Next appointment: Follow up in one year for your annual wellness visit    Preventive Care 65 Years and Older, Female Preventive care refers to lifestyle choices and visits with your health care provider that can promote health and wellness. What does preventive care include? A yearly physical exam. This is also called an annual well check. Dental exams once or twice a year. Routine eye exams. Ask your health care provider how often you should have your eyes checked. Personal lifestyle choices, including: Daily care of your teeth and gums. Regular physical activity. Eating a healthy diet. Avoiding tobacco and drug use. Limiting alcohol use. Practicing safe sex. Taking low-dose aspirin every day. Taking vitamin and mineral supplements as recommended by your health care provider. What happens during an annual well check? The services and  screenings done by your health care provider during your annual well check will depend on your age, overall health, lifestyle risk factors, and family history of disease. Counseling  Your health care provider may ask you questions about your: Alcohol use. Tobacco use. Drug use. Emotional well-being. Home and relationship well-being. Sexual activity. Eating habits. History of falls. Memory and ability to understand (cognition). Work and work Astronomer. Reproductive health. Screening  You may have the following tests or measurements: Height, weight, and BMI. Blood pressure. Lipid and cholesterol levels. These may be checked every 5 years, or more frequently if you are over 14 years old. Skin check. Lung cancer screening. You may have this screening every year starting at age 33 if you have a 30-pack-year history of smoking and currently smoke or have quit within the past 15 years. Fecal occult blood test (FOBT) of the stool. You may have this test every year starting at age 43. Flexible sigmoidoscopy or colonoscopy. You may have a sigmoidoscopy every 5 years or a colonoscopy every 10 years starting at age 20. Hepatitis C blood test. Hepatitis B blood test. Sexually transmitted disease (STD) testing. Diabetes screening. This is done by checking your blood sugar (glucose) after you have not eaten for a while (fasting). You may have this done every 1-3 years. Bone density scan. This is done to screen for osteoporosis. You may have this done starting at age 68. Mammogram. This may be done  every 1-2 years. Talk to your health care provider about how often you should have regular mammograms. Talk with your health care provider about your test results, treatment options, and if necessary, the need for more tests. Vaccines  Your health care provider may recommend certain vaccines, such as: Influenza vaccine. This is recommended every year. Tetanus, diphtheria, and acellular pertussis (Tdap,  Td) vaccine. You may need a Td booster every 10 years. Zoster vaccine. You may need this after age 73. Pneumococcal 13-valent conjugate (PCV13) vaccine. One dose is recommended after age 56. Pneumococcal polysaccharide (PPSV23) vaccine. One dose is recommended after age 48. Talk to your health care provider about which screenings and vaccines you need and how often you need them. This information is not intended to replace advice given to you by your health care provider. Make sure you discuss any questions you have with your health care provider. Document Released: 06/05/2015 Document Revised: 01/27/2016 Document Reviewed: 03/10/2015 Elsevier Interactive Patient Education  2017 [employer].  Fall Prevention in the Home Falls can cause injuries. They can happen to people of all ages. There are many things you can do to make your home safe and to help prevent falls. What can I do on the outside of my home? Regularly fix the edges of walkways and driveways and fix any cracks. Remove anything that might make you trip as you walk through a door, such as a raised step or threshold. Trim any bushes or trees on the path to your home. Use bright outdoor lighting. Clear any walking paths of anything that might make someone trip, such as rocks or tools. Regularly check to see if handrails are loose or broken. Make sure that both sides of any steps have handrails. Any raised decks and porches should have guardrails on the edges. Have any leaves, snow, or ice cleared regularly. Use sand or salt on walking paths during winter. Clean up any spills in your garage right away. This includes oil or grease spills. What can I do in the bathroom? Use night lights. Install grab bars by the toilet and in the tub and shower. Do not use towel bars as grab bars. Use non-skid mats or decals in the tub or shower. If you need to sit down in the shower, use a plastic, non-slip stool. Keep the floor dry. Clean up any  water that spills on the floor as soon as it happens. Remove soap buildup in the tub or shower regularly. Attach bath mats securely with double-sided non-slip rug tape. Do not have throw rugs and other things on the floor that can make you trip. What can I do in the bedroom? Use night lights. Make sure that you have a light by your bed that is easy to reach. Do not use any sheets or blankets that are too big for your bed. They should not hang down onto the floor. Have a firm chair that has side arms. You can use this for support while you get dressed. Do not have throw rugs and other things on the floor that can make you trip. What can I do in the kitchen? Clean up any spills right away. Avoid walking on wet floors. Keep items that you use a lot in easy-to-reach places. If you need to reach something above you, use a strong step stool that has a grab bar. Keep electrical cords out of the way. Do not use floor polish or wax that makes floors slippery. If you must use wax, use  non-skid floor wax. Do not have throw rugs and other things on the floor that can make you trip. What can I do with my stairs? Do not leave any items on the stairs. Make sure that there are handrails on both sides of the stairs and use them. Fix handrails that are broken or loose. Make sure that handrails are as long as the stairways. Check any carpeting to make sure that it is firmly attached to the stairs. Fix any carpet that is loose or worn. Avoid having throw rugs at the top or bottom of the stairs. If you do have throw rugs, attach them to the floor with carpet tape. Make sure that you have a light switch at the top of the stairs and the bottom of the stairs. If you do not have them, ask someone to add them for you. What else can I do to help prevent falls? Wear shoes that: Do not have high heels. Have rubber bottoms. Are comfortable and fit you well. Are closed at the toe. Do not wear sandals. If you use a  stepladder: Make sure that it is fully opened. Do not climb a closed stepladder. Make sure that both sides of the stepladder are locked into place. Ask someone to hold it for you, if possible. Clearly mark and make sure that you can see: Any grab bars or handrails. First and last steps. Where the edge of each step is. Use tools that help you move around (mobility aids) if they are needed. These include: Canes. Walkers. Scooters. Crutches. Turn on the lights when you go into a dark area. Replace any light bulbs as soon as they burn out. Set up your furniture so you have a clear path. Avoid moving your furniture around. If any of your floors are uneven, fix them. If there are any pets around you, be aware of where they are. Review your medicines with your doctor. Some medicines can make you feel dizzy. This can increase your chance of falling. Ask your doctor what other things that you can do to help prevent falls. This information is not intended to replace advice given to you by your health care provider. Make sure you discuss any questions you have with your health care provider. Document Released: 03/05/2009 Document Revised: 10/15/2015 Document Reviewed: 06/13/2014 Elsevier Interactive Patient Education  2017 [employer].

## 2022-10-27 NOTE — Telephone Encounter (Signed)
Patient states nothing going on just thought she should have it checked since it's been a while.  Advised that they will check at her appt in October with Dr Rennis Golden.  However if any concerns or issues please let us know and we can set an appt with a NP or PA to do EKG and evaluate.  She assures, nothing going on and no changes.  She will follow up at scheduled appt.

## 2022-10-31 ENCOUNTER — Telehealth: Payer: Self-pay | Admitting: Internal Medicine

## 2022-10-31 NOTE — Telephone Encounter (Signed)
Pt c/o of Chest Pain: STAT if CP now or developed within 24 hours  1. Are you having CP right now? No   2. Are you experiencing any other symptoms (ex. SOB, nausea, vomiting, sweating)? No   3. How long have you been experiencing CP? She states she had pain in her heart last night and the night before last, but she states she is fine now.   4. Is your CP continuous or coming and going? Coming and going.   5. Have you taken Nitroglycerin? No  ?

## 2022-10-31 NOTE — Telephone Encounter (Signed)
Spoke to patient she stated she has had 2 episodes of chest pain last week.No chest pain at present.Stated appointment was scheduled with Dr.Hilty 10/4 at 3:00 pm.Stated she would like to be seen sooner.Appointment scheduled with Carlos Levering NP 6/17 at 1:55 pm.Advised if she has any more chest pain she needs to go to ED to be evaluated.

## 2022-11-05 ENCOUNTER — Other Ambulatory Visit: Payer: Self-pay | Admitting: Nurse Practitioner

## 2022-11-05 DIAGNOSIS — N390 Urinary tract infection, site not specified: Secondary | ICD-10-CM

## 2022-11-06 NOTE — Progress Notes (Unsigned)
Cardiology Clinic Note   Date: 11/07/2022 ID: Deisi, Daloia 06-12-1950, MRN [id number]  Primary Cardiologist:  Chrystie Nose, MD  Patient Profile    Vickie Bowman is a 72 y.o. female who presents to the clinic today for evaluation of chest pain.     Past medical history significant for: Chronic systolic heart failure/stress-induced cardiomyopathy. Echo 02/02/2018: EF 35 to 40%.  Hyperdynamic basal segment with dyskinetic mid apical segments in all walls.  Grade I DD.  Moderate TR.  Mildly increased PA pressure. Echo 05/30/2018: EF 55 to 60%.  Moderate TR.  Mildly increased PA pressure. Nonobstructive CAD. LHC 02/05/2018: Mild nonobstructive CAD involving proximal LAD and mid RCA. Hyperlipidemia. Lipid panel 07/01/2020: LDL 53, HDL 59, TG 86, total 129. COPD. Quit smoking 6 years ago.  Anxiety.     History of Present Illness    Vickie Bowman was first evaluated by cardiology on 02/02/2018 for elevated troponin during hospital admission.  Patient had a 50-year tobacco abuse history and had recently quit.  A few months prior she received inhalers from her PCP for increased DOE assumed to be COPD.  They were initially helpful but over time dyspnea worsened which prompted her to quit smoking.  On the night of 01/31/2018 her breathing became considerably worse with shortness of breath at rest and waking her from sleep.  EMS was called and administered albuterol, Atrovent, IV Solu-Medrol.  She was admitted for COPD exacerbation.  Troponin was noted to be elevated and cardiology was consulted.  Echo showed EF 35 to 40%, hyperdynamic basal segment with dyskinetic mid to apical segments in all walls.  LHC showed mild nonobstructive CAD involving proximal LAD and mid RCA.  Patient was instructed to follow-up as an outpatient.  Patient was last seen in the office by Dr. Rennis Golden on 11/19/2021 for routine follow-up.  She reported continued shortness of breath on oxygen for her COPD.  She had stopped  statin secondary to leg pain but symptoms were not resolved however she did not restart statin.  She was instructed to restart rosuvastatin and return in 3 months for repeat lipids and liver enzymes.  Patient contacted the office on 10/31/2022 with complaints of chest pain.  Per triage: "Spoke to patient she stated she has had 2 episodes of chest pain last week.No chest pain at present.Stated appointment was scheduled with Dr.Hilty 10/4 at 3:00 pm.Stated she would like to be seen sooner.Appointment scheduled with Carlos Levering NP 6/17 at 1:55 pm.Advised if she has any more chest pain she needs to go to ED to be evaluated."  Today, patient is here alone. She reports two episodes of chest pain 2 weeks ago. Pain was in the center of her chest pain under left breast and only lasted a couple of seconds and occurred at rest. No episodes since. She reports she has had this pain before "for years" and it is unchanged. She is on continuous O2 at 2 L for her COPD. She is able to perform light and moderate housework as long as she takes breaks secondary to DOE. She is able to sleep flat on her stomach or on her side. She does feel slightly short of breath when she lays flat on her back. She has not experienced any chest pain with activity. She reports occasional bilateral ankle edema. She normally sits in a dependent position and notices puffiness toward the end of the day. Ankles are normal in the AM.     ROS: All  other systems reviewed and are otherwise negative except as noted in History of Present Illness.  Studies Reviewed    ECG personally reviewed by me today: Sinus rhythm with occasional PVCs.   No significant changes from 11/19/2021.          Physical Exam    VS:  BP 98/68 (BP Location: Left Arm, Patient Position: Sitting, Cuff Size: Normal)   Pulse 80   Ht 5\' 8"  (1.727 m)   Wt 138 lb 6.4 oz (62.8 kg)   SpO2 99%   BMI 21.04 kg/m  , BMI Body mass index is 21.04 kg/m.  GEN: Well nourished,  well developed, in no acute distress. Neck: No JVD or carotid bruits. Cardiac:  RRR. No murmurs. No rubs or gallops.   Respiratory:  Respirations regular and unlabored. Clear to auscultation without rales, wheezing or rhonchi. GI: Soft, nontender, nondistended. Extremities: Radials/DP/PT 2+ and equal bilaterally. No clubbing or cyanosis. No edema.  Skin: Warm and dry, no rash. Neuro: Strength intact.  Assessment & Plan    Nonobstructive CAD/atypical chest pain. LHC September 2019 showed mild nonobstructive CAD.  Patient reports two weeks ago she had 2 isolated episodes of very brief chest pain in center of chest and under left breast that occurred at rest and lasted only a couple of seconds. She has not had any episodes since. She performs light to moderate household chores without chest pain. She does experience dyspnea with exertion secondary to her COPD but she is able to accomplish exertional tasks by taking rest breaks. She is on 2 L O2 continuously.  Continue aspirin, metoprolol, rosuvastatin. Discussed deferring further evaluation, as pain is similar to previous episodes over the years and was very brief. She is in agreement. She is instructed to contact the office if episodes become more frequent. ED precautions provided.  Chronic systolic heart failure/stress-induced cardiomyopathy with recovered LV function.  Echo January 2020 showed EF 55 to 60%, moderate TR, mildly increased PA pressure.  Patient denies lower extremity edema. She has DOE secondary to COPD. She can lay flat on her stomach and side but not on her back. No PND.  Euvolemic and well compensated on exam. Continue metoprolol. Hyperlipidemia.  LDL February 2022 53, at goal.  Patient has a follow up with Dr. Rennis Golden in October. She will get labs a couple of weeks prior to her visit.   Disposition: Return for previously scheduled visit with Dr. Rennis Golden. She understands to get lab work prior to visit. Return sooner as needed.           Signed, Etta Grandchild. Reylynn Vanalstine, DNP, NP-C

## 2022-11-07 ENCOUNTER — Ambulatory Visit: Payer: 59 | Attending: Student | Admitting: Student

## 2022-11-07 ENCOUNTER — Encounter: Payer: Self-pay | Admitting: Student

## 2022-11-07 VITALS — BP 98/68 | HR 80 | Ht 68.0 in | Wt 138.4 lb

## 2022-11-07 DIAGNOSIS — I2510 Atherosclerotic heart disease of native coronary artery without angina pectoris: Secondary | ICD-10-CM | POA: Diagnosis not present

## 2022-11-07 DIAGNOSIS — R0789 Other chest pain: Secondary | ICD-10-CM | POA: Diagnosis not present

## 2022-11-07 DIAGNOSIS — E785 Hyperlipidemia, unspecified: Secondary | ICD-10-CM | POA: Diagnosis not present

## 2022-11-07 DIAGNOSIS — I5022 Chronic systolic (congestive) heart failure: Secondary | ICD-10-CM

## 2022-11-07 DIAGNOSIS — I5181 Takotsubo syndrome: Secondary | ICD-10-CM

## 2022-11-07 NOTE — Patient Instructions (Signed)
Medication Instructions:   Your physician recommends that you continue on your current medications as directed. Please refer to the Current Medication list given to you today.  *If you need a refill on your cardiac medications before your next appointment, please call your pharmacy*  Lab Work: NONE ordered at this time of appointment   If you have labs (blood work) drawn today and your tests are completely normal, you will receive your results only by: MyChart Message (if you have MyChart) OR A paper copy in the mail If you have any lab test that is abnormal or we need to change your treatment, we will call you to review the results.  Testing/Procedures: NONE ordered at this time of appointment   Follow-Up: At [facility], you and your health needs are our priority.  As part of our continuing mission to provide you with exceptional heart care, we have created designated Provider Care Teams.  These Care Teams include your primary Cardiologist (physician) and Advanced Practice Providers (APPs -  Physician Assistants and Nurse Practitioners) who all work together to provide you with the care you need, when you need it.  We recommend signing up for the patient portal called "MyChart".  Sign up information is provided on this After Visit Summary.  MyChart is used to connect with patients for Virtual Visits (Telemedicine).  Patients are able to view lab/test results, encounter notes, upcoming appointments, etc.  Non-urgent messages can be sent to your provider as well.   To learn more about what you can do with MyChart, go to [email or web address].    Your next appointment:   As previously scheduled    Provider:   Chrystie Nose, MD     Other Instructions Premature Ventricular Contraction  A premature ventricular contraction (PVC) is a type of irregular heartbeat (arrhythmia). The heart has four chambers, including the upper chambers (atria) and lower chambers (ventricles).  Normally, an electrical signal starts in a group of cells called the sinoatrial node (SA node) and travels through the atria, causing them to pump blood into the ventricles. During a PVC, the heartbeat starts in one of the lower ventricles. This may cause the heartbeat to be shorter and less effective. In most cases, PVCs come and go and do not require treatment. What are the causes? Common causes of the condition include: Heart attack or coronary artery disease (CAD). Heart valve problems. Heart surgery. Infection of the heart (myocarditis). Inflammation of the heart. In many cases, the cause of this condition is not known. What increases the risk? The following factors may make you more likely to develop this condition: Age, especially being over age 89. Being female. An imbalance of salts and minerals in the body (electrolytes). Low blood oxygen levels or high carbon dioxide levels. Certain medicines, including over-the-counter and prescribed medicines. High blood pressure. Obesity. Episodes may be triggered by: Vigorous exercise. Tobacco, alcohol, or caffeine use. Illegal drug use. Emotional stress. Poor or irregular sleep. What are the signs or symptoms? The main symptoms of this condition are fast or irregular heartbeats (palpitations) or the feeling of a pause in the heartbeat. Other symptoms include: Shortness of breath. Difficulty exercising. Chest pain. Feeling tired. Dizziness. In some cases, there are no symptoms. How is this diagnosed? This condition may be diagnosed based on: Your medical history or symptoms. A physical exam. Your health care provider may listen to your heart. Tests, such as: Blood tests. An ECG (electrocardiogram) to monitor the electrical activity of your heart.  An ambulatory cardiac monitor that records your heartbeats for 24 hours or more. Stress tests to see how exercise affects your heart rhythm and blood supply. An echocardiogram, which  creates an image of your heart. An electrophysiology study (EPS) to check for electrical problems in your heart. How is this treated? Treatment for this condition depends on any underlying conditions, the type of PVC, how many PVCs you have had, and if the symptoms are affecting your daily life. Possible treatments include: Avoiding things that cause PVCs (triggers). These include caffeine, tobacco, and alcohol. Taking medicines if symptoms are severe or if the arrhythmias happen a lot. Getting treatment for underlying conditions that cause PVCs. Having an implantable cardioverter defibrillator (ICD) placed in the chest to monitor the heartbeat. The monitor sends a shock to the heart if it senses an arrhythmia and brings the heartbeat back to normal. Having a catheter ablation procedure to destroy the part of the heart tissue that sends abnormal signals. In many cases, no treatment is required. Follow these instructions at home: Lifestyle  Do not use any products that contain nicotine or tobacco. These products include cigarettes, chewing tobacco, and vaping devices, such as e-cigarettes. If you need help quitting, ask your health care provider. Do not use illegal drugs. Exercise regularly. Ask your health care provider what type of exercise is safe for you. Try to get at least 7-9 hours of sleep each night. Find healthy ways to manage stress. Avoid stressful situations when possible. Alcohol use Do not drink alcohol if: Your health care provider tells you not to drink. You are pregnant, may be pregnant, or are planning to become pregnant. Alcohol triggers your episodes. If you drink alcohol: Limit how much you have to: 0-1 drink a day for women. 0-2 drinks a day for men. Know how much alcohol is in your drink. In the [place] one drink equals one 12 oz bottle of beer (355 mL), one 5 oz glass of wine (148 mL), or one 1 oz glass of hard liquor (44 mL). General instructions Take  over-the-counter and prescription medicines only as told by your health care provider. If caffeine triggers episodes of PVC, do not eat, drink, or use anything with caffeine in it. Contact a health care provider if: You feel palpitations often. You have nausea and vomiting. Get help right away if: You have chest pain. You have trouble breathing. You start sweating for no reason. You become light-headed or you faint. These symptoms may be an emergency. Get help right away. Call 911. Do not wait to see if the symptoms will go away. Do not drive yourself to the hospital. This information is not intended to replace advice given to you by your health care provider. Make sure you discuss any questions you have with your health care provider. Document Revised: 10/08/2021 Document Reviewed: 10/08/2021 Elsevier Patient Education  2024 [employer].

## 2022-11-08 ENCOUNTER — Encounter: Payer: Self-pay | Admitting: Pulmonary Disease

## 2022-11-08 ENCOUNTER — Ambulatory Visit (INDEPENDENT_AMBULATORY_CARE_PROVIDER_SITE_OTHER): Payer: 59 | Admitting: Pulmonary Disease

## 2022-11-08 VITALS — BP 110/70 | HR 82 | Ht 68.0 in | Wt 138.0 lb

## 2022-11-08 DIAGNOSIS — Z122 Encounter for screening for malignant neoplasm of respiratory organs: Secondary | ICD-10-CM | POA: Diagnosis not present

## 2022-11-08 DIAGNOSIS — Z87891 Personal history of nicotine dependence: Secondary | ICD-10-CM | POA: Diagnosis not present

## 2022-11-08 DIAGNOSIS — E43 Unspecified severe protein-calorie malnutrition: Secondary | ICD-10-CM | POA: Diagnosis not present

## 2022-11-08 DIAGNOSIS — J449 Chronic obstructive pulmonary disease, unspecified: Secondary | ICD-10-CM | POA: Diagnosis not present

## 2022-11-08 DIAGNOSIS — J9611 Chronic respiratory failure with hypoxia: Secondary | ICD-10-CM | POA: Diagnosis not present

## 2022-11-08 NOTE — Progress Notes (Signed)
Synopsis: Referred in November 2019 for COPD by Carlean Jews, NP  Subjective:   PATIENT ID: Vickie Bowman GENDER: female DOB: 1950-07-11, MRN: [id number]  Chief Complaint  Patient presents with   Follow-up    F/up on chronic res. failure with hypoxia    PMH of CHF, tobacco abuse, quit smoking Aug 2019. Smoked since age 72 years.  Patient was admitted to the hospital in September 2019 as well as in October 2019 for COPD exacerbations.  The patient's most recent hospitalization for COPD she developed hypoxemic respiratory failure requiring nasal cannula supplementation.  Initial echocardiogram revealed a reduced ejection fraction and she was subsequently taken for a left heart cath in September 2019 which revealed nonobstructive CAD.  The patient was treated in the emergency room with CPAP, steroids, magnesium, albuterol.  Hospital course was relatively uncomplicated and the patient was discharged home on oxygen therapy.  Since her hospitalization she has been overall been doing well.  She does have significant dyspnea on exertion.  She feels like she has slowly been recovering.  She does acknowledge that she has been able to quit smoking.  She is currently on 3 L pulse from a POC.  She states that she has been trying to eat.  She does know that her BMI is low.  Patient denies chest pain, hemoptysis, daily sputum production.  OV 06/06/2018: She feels like she is a little congested. This has been getting worse throughout the past week. Cough is much worse. Not really bringing anything up at this time. She trys but it feels stuck.  Patient denies any fevers.  She has woke up in the middle the night with ongoing cough for the past several nights.  She does get up in the middle of the night and feel like her chest is more tight.  She has been using her albuterol more over the past couple of days.  Does not have any significant sputum production.  OV 11/29/2018: Here today for follow-up for  management of severe COPD.  She has oxygen dependent respiratory failure on oxygen concentrator today in the office at 3 L.  Patient currently managed well on Symbicort and Spiriva Respimat.  She likes her inhaler regimen.  She does notice that has made her breathe better.  Today in the office we discussed her goals of care.  She states that she remembers distinctly seeing her mother received cardiopulmonary resuscitation.  She would not ever want this to occur to her.  She understands that at some point she may get to the point where she is unable to breathe.  And would not want to be placed on mechanical life support.  We discussed all of this to include risk, benefits and alternatives in detail today in the office.  She is elected to proceed with filling out durable DNR forms today in the office.  As for her ongoing respiratory symptoms she wants to continue the use of her inhalers as long as possible.  We discussed the alternatives of using nebulized therapy as well.  She states that they also has trouble with her current POC machine.  She discussed this with her provider DME provider.  They need a new prescription to help with replacing the old one.  OV 06/26/2019: Patient seen today for follow-up regarding advanced COPD.  Last office visit she completed durable DNR forms in [place] M[facility] form.  At this point she is doing well on Symbicort plus Spiriva.  She is very anxious  about ever having to go back in the hospital she would like to avoid this at all cost.  Otherwise from a respiratory standpoint she does have shortness of breath with exertion.  But she is still using her oxygen and trying to stay active as much as possible.  Her weight has been stable.  She denies night sweats fevers weight loss chills.  Patient denies hemoptysis  OV 10/02/2019: Patient is here today for COPD follow up.  Patient was started on breztri inhaler.  At this time doing well.  She still thinks that she is breathing much  better than where she was before.  She can see a significant difference after changing inhaler regimen.  She feels like she can breathe better in the evening time in comparison to her other meds.  She is able to take a bath without feeling as dyspneic as she was in the past.  Denies hemoptysis.  Is denies any significant cough or sputum production.  OV 03/26/2020: Patient here for COPD follow-up.  She was on breztri inhaler as treated previously.  She does feel like she has shortness of breath with exertion.  She does feel stable at this time with her current regimen but she is interested in potentially switching to Trelegy inhaler.  She has had some friends that have used and have done really well with it.  She would like to give it a shot to see if makes any difference in her respiratory symptoms.  Currently stable on 2 L pulse with a POC today in the office.  She has a planned noncontrasted CT chest lung cancer screening for April 2021.  OV 03/19/2021: doing well today. Copd stable. Able to complete ADLs.  Respiratory wise still has some dyspnea on exertion.  This is at her baseline.  No recent exacerbations.  OV 11/01/2021: Here today for COPD management.  Patient doing well.  Needs refills of Breztri albuterol and albuterol solution.  Still uses her oxygen daily.  Patient has no complaints at this time.  She had a lung cancer screening CT in April 2023 which was a lung RADS 2.  She has a repeat noncontrasted CT in April 2024 scheduled for yearly screening.  Her weight has been stable.  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  Was seen last June.  She has had no hospitalizations since she was last seen.  No severe exacerbations.  Using her Breztri inhaler regimen daily as needed albuterol as needed.Uses her oxygen supplementation.  She has been trying to stay as active as she  possibly can.  She does feel like she is trying to eat more and keep her weight stable.      Past Medical History:  Diagnosis Date   Anxiety    CHF (congestive heart failure) (HCC)    COPD (chronic obstructive pulmonary disease) (HCC)    Lumbar herniated disc    Tobacco abuse      Family History  Problem Relation Age of Onset   Liver cancer Mother    Breast cancer Sister    CAD Neg Hx      Past Surgical History:  Procedure Laterality Date   LEFT HEART CATH AND CORONARY ANGIOGRAPHY N/A 02/05/2018   Procedure: LEFT HEART CATH AND CORONARY ANGIOGRAPHY;  Surgeon: Yvonne Kendall, MD;  Location: [facility];  Service: Cardiovascular;  Laterality: N/A;    Social  History   Socioeconomic History   Marital status: Widowed    Spouse name: Not on file   Number of children: Not on file   Years of education: Not on file   Highest education level: Not on file  Occupational History   Not on file  Tobacco Use   Smoking status: Former    Packs/day: 1.50    Years: 53.00    Additional pack years: 0.00    Total pack years: 79.50    Types: Cigarettes    Start date: 05/24/1967    Quit date: 01/07/2018    Years since quitting: 4.8   Smokeless tobacco: Never   Tobacco comments:    Smoked since age 2, quit 12/2017  Vaping Use   Vaping Use: Never used  Substance and Sexual Activity   Alcohol use: Yes    Comment: Rare, glass of wine no more than every few weeks   Drug use: Never   Sexual activity: Not Currently  Other Topics Concern   Not on file  Social History Narrative   Not on file   Social Determinants of Health   Financial Resource Strain: Low Risk  (10/27/2022)   Overall Financial Resource Strain (CARDIA)    Difficulty of Paying Living Expenses: Not hard at all  Food Insecurity: No Food Insecurity (10/27/2022)   Hunger Vital Sign    Worried About Running Out of Food in the Last Year: Never true    Ran Out of Food in the Last Year: Never true  Transportation Needs:  No Transportation Needs (10/27/2022)   PRAPARE - Administrator, Civil Service (Medical): No    Lack of Transportation (Non-Medical): No  Physical Activity: Insufficiently Active (10/27/2022)   Exercise Vital Sign    Days of Exercise per Week: 2 days    Minutes of Exercise per Session: 20 min  Stress: No Stress Concern Present (10/27/2022)   [employer] of Occupational Health - Occupational Stress Questionnaire    Feeling of Stress : Not at all  Social Connections: Socially Isolated (10/27/2022)   Social Connection and Isolation Panel [NHANES]    Frequency of Communication with Friends and Family: More than three times a week    Frequency of Social Gatherings with Friends and Family: More than three times a week    Attends Religious Services: Never    Database administrator or Organizations: No    Attends Banker Meetings: Never    Marital Status: Widowed  Intimate Partner Violence: Not At Risk (10/27/2022)   Humiliation, Afraid, Rape, and Kick questionnaire    Fear of Current or Ex-Partner: No    Emotionally Abused: No    Physically Abused: No    Sexually Abused: No     Allergies  Allergen Reactions   Penicillins Swelling    Has patient had a PCN reaction causing immediate rash, facial/tongue/throat swelling, SOB or lightheadedness with hypotension: Yes Has patient had a PCN reaction causing severe rash involving mucus membranes or skin necrosis: No Has patient had a PCN reaction that required hospitalization: No Has patient had a PCN reaction occurring within the last 10 years: No If all of the above answers are "NO", then may proceed with Cephalosporin use.      Outpatient Medications Prior to Visit  Medication Sig Dispense Refill   albuterol (PROVENTIL) (2.5 MG/3ML) 0.083% nebulizer solution Take 3 mLs (2.5 mg total) by nebulization every 6 (six) hours as needed for wheezing or shortness of breath. 75  mL 12   albuterol (VENTOLIN HFA) 108 (90 Base)  MCG/ACT inhaler Inhale 2 puffs into the lungs every 6 (six) hours as needed for wheezing or shortness of breath. 8 g 2   ALPRAZolam (XANAX) 1 MG tablet Take 1 tablet (1 mg total) by mouth 2 (two) times daily. 60 tablet 2   aspirin EC 81 MG EC tablet Take 1 tablet (81 mg total) by mouth daily. 30 tablet 0   BREZTRI AEROSPHERE 160-9-4.8 MCG/ACT AERO Inhale 2 puffs into the lungs in the morning and at bedtime. 10.7 g 11   Cholecalciferol (VITAMIN D) 2000 units CAPS Take 1 capsule (2,000 Units total) by mouth daily. 30 capsule 5   ciprofloxacin (CIPRO) 500 MG tablet Take 1 tablet (500 mg total) by mouth 2 (two) times daily. 14 tablet 0   cyanocobalamin (VITAMIN B12) 1000 MCG tablet Take 1 tablet (1,000 mcg total) by mouth daily. 30 tablet 6   DULoxetine (CYMBALTA) 30 MG capsule Take 1 capsule (30 mg total) by mouth daily. Take a additional 60 mg for a total of 90 mg 30 capsule 6   DULoxetine (CYMBALTA) 60 MG capsule Take 1 capsule (60 mg total) by mouth daily. Take for an additional 30 mg for a total of 90 mg 30 capsule 3   HYDROcodone-acetaminophen (NORCO) 7.5-325 MG tablet Take 1 tablet by mouth 2 (two) times daily as needed. 60 tablet 0   ipratropium-albuterol (DUONEB) 0.5-2.5 (3) MG/3ML SOLN Take 3 mLs by nebulization every 6 (six) hours as needed. 90 mL 3   Lidocaine (ZTLIDO) 1.8 % PTCH Apply 1 patch topically daily. 30 patch 2   meloxicam (MOBIC) 7.5 MG tablet Take 1 tablet (7.5 mg total) by mouth daily. 90 tablet 1   metoprolol succinate (TOPROL-XL) 25 MG 24 hr tablet Take 0.5 tablets (12.5 mg total) by mouth daily. 90 tablet 1   predniSONE (STERAPRED UNI-PAK 21 TAB) 10 MG (21) TBPK tablet 6 day taper - take by mouth as directed for 6 days 21 tablet 0   rosuvastatin (CRESTOR) 20 MG tablet Take 1 tablet (20 mg total) by mouth daily. NEED OV. 90 tablet 0   Spacer/Aero-Holding Chambers (AEROCHAMBER MV) inhaler Use as instructed 1 each 0   sulfamethoxazole-trimethoprim (BACTRIM) 400-80 MG tablet Take  1 tablet by mouth 2 (two) times daily. 14 tablet 0   terbinafine (LAMISIL AT) 1 % cream Apply 1 Application topically 2 (two) times daily. 42 g 2   No facility-administered medications prior to visit.    Review of Systems  Constitutional:  Negative for chills, fever, malaise/fatigue and weight loss.  HENT:  Negative for hearing loss, sore throat and tinnitus.   Eyes:  Negative for blurred vision and double vision.  Respiratory:  Positive for cough and shortness of breath. Negative for hemoptysis, sputum production, wheezing and stridor.   Cardiovascular:  Negative for chest pain, palpitations, orthopnea, leg swelling and PND.  Gastrointestinal:  Negative for abdominal pain, constipation, diarrhea, heartburn, nausea and vomiting.  Genitourinary:  Negative for dysuria, hematuria and urgency.  Musculoskeletal:  Negative for joint pain and myalgias.  Skin:  Negative for itching and rash.  Neurological:  Negative for dizziness, tingling, weakness and headaches.  Endo/Heme/Allergies:  Negative for environmental allergies. Does not bruise/bleed easily.  Psychiatric/Behavioral:  Negative for depression. The patient is not nervous/anxious and does not have insomnia.   All other systems reviewed and are negative.    Objective:  Physical Exam Vitals reviewed.  Constitutional:  General: She is not in acute distress.    Appearance: She is well-developed.  HENT:     Head: Normocephalic and atraumatic.     Mouth/Throat:     Pharynx: No oropharyngeal exudate.  Eyes:     Conjunctiva/sclera: Conjunctivae normal.     Pupils: Pupils are equal, round, and reactive to light.  Neck:     Vascular: No JVD.     Trachea: No tracheal deviation.     Comments: Loss of supraclavicular fat Cardiovascular:     Rate and Rhythm: Normal rate and regular rhythm.     Heart sounds: S1 normal and S2 normal.     Comments: Distant heart tones Pulmonary:     Effort: No tachypnea or accessory muscle usage.      Breath sounds: No stridor. Decreased breath sounds (throughout all lung fields) present. No wheezing, rhonchi or rales.     Comments: Severely diminished breath sounds bilaterally Abdominal:     General: Bowel sounds are normal. There is no distension.     Palpations: Abdomen is soft.     Tenderness: There is no abdominal tenderness.  Musculoskeletal:        General: Deformity (muscle wasting ) present.  Skin:    General: Skin is warm and dry.     Capillary Refill: Capillary refill takes less than 2 seconds.     Findings: No rash.  Neurological:     Mental Status: She is alert and oriented to person, place, and time.  Psychiatric:        Behavior: Behavior normal.      Vitals:   11/08/22 1307  BP: 110/70  Pulse: 82  SpO2: 98%  Weight: 138 lb (62.6 kg)  Height: 5\' 8"  (1.727 m)   98% on 3 L pulse POC BMI Readings from Last 3 Encounters:  11/08/22 20.98 kg/m  11/07/22 21.04 kg/m  10/27/22 20.17 kg/m   Wt Readings from Last 3 Encounters:  11/08/22 138 lb (62.6 kg)  11/07/22 138 lb 6.4 oz (62.8 kg)  10/27/22 139 lb (63 kg)     CBC    Component Value Date/Time   WBC 5.7 07/01/2020 1430   WBC 5.7 03/05/2018 0327   RBC 4.56 07/01/2020 1430   RBC 4.61 03/05/2018 0327   HGB 13.6 07/01/2020 1430   HCT 41.2 07/01/2020 1430   PLT 192 07/01/2020 1430   MCV 90 07/01/2020 1430   MCH 29.8 07/01/2020 1430   MCH 30.4 03/05/2018 0327   MCHC 33.0 07/01/2020 1430   MCHC 31.5 03/05/2018 0327   RDW 13.1 07/01/2020 1430   LYMPHSABS 2.3 03/04/2018 2146   MONOABS 0.7 03/04/2018 2146   EOSABS 0.2 03/04/2018 2146   BASOSABS 0.1 03/04/2018 2146    Chest Imaging: 03/04/2018 chest x-ray: Emphysematous lung changes The patient's images have been independently reviewed by me.    Low-dose lung cancer screening CT completed in March 2020: Lung RADS 1, considered negative recommending annual 13-month follow-up.  This is already been ordered. Additionally there was some findings of  bronchiectasis with no lower lobe evidence of mucoid impaction possible underlying atypical infection such as MAC.  The patient's images have been independently reviewed by me.    09/04/2019: Lung cancer screening CT Lung RADS 1 - recommending 1 year follow-up evidence of emphysema and coronary artery disease. The patient's images have been independently reviewed by me.    April 2022 LDCT: No worrisome lesions. Lung rads 1  Repeat annual screening next year.  The patient's  images have been independently reviewed by me.    April 2023 LDCT: Lung rads 2 Benign appearing, no worrisome lesions. The patient's images have been independently reviewed by me.    May 2024 lung cancer screening CT lung RADS 2, annual repeat planned for 2025. The patient's images have been independently reviewed by me.    Pulmonary Functions Testing Results:    Latest Ref Rng & Units 03/30/2018    3:51 PM  PFT Results  FVC-Pre L 2.11   FVC-Predicted Pre % 100   FVC-Post L 2.31   FVC-Predicted Post % 109   Pre FEV1/FVC % % 41   Post FEV1/FCV % % 48   FEV1-Pre L 0.87   FEV1-Predicted Pre % 55   FEV1-Post L 1.10   DLCO uncorrected ml/min/mmHg 10.54   DLCO UNC% % 78   DLVA Predicted % 59   TLC L 8.24   TLC % Predicted % 213   RV % Predicted % 341    : SIX MIN WALK 06/06/2018  Medications Proair at 3pm, Aspirin 81mg  at 7:30, Vitamin D 2000 units, metoprolol 12.5mg , Dulera 100, Spiriva at 10am  Supplimental Oxygen during Test? (L/min) Yes  O2 Flow Rate 3  Type Continuous  Laps 9  Partial Lap (in Meters) 0  Baseline BP (sitting) 114/70  Baseline Heartrate 91  Baseline Dyspnea (Borg Scale) 3  Baseline Fatigue (Borg Scale) 3  Baseline SPO2 98  BP (sitting) 130/80  Heartrate 115  Dyspnea (Borg Scale) 9  Fatigue (Borg Scale) 4  SPO2 95  BP (sitting) 122/70  Heartrate 101  SPO2 98  Stopped or Paused before Six Minutes No  Distance Completed 306  Tech Comments: Pt walked at a normal  pace without stopping during the walk. Pt denied any complaints.    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 Normal left ventricular filling pressure. Low normal left ventricular filling pressure with subtle apical hypokinesis (LVEF 50-55%).    Assessment & Plan:     ICD-10-CM   1. Chronic respiratory failure with hypoxia (HCC)  J96.11     2. Protein-calorie malnutrition, severe  E43     3. Chronic obstructive pulmonary disease, unspecified COPD type (HCC)  J44.9     4. Former smoker  Z87.891     5. Screening for malignant neoplasm of respiratory organ  Z12.2        Discussion:  This is a 72 year old female, stage II COPD chronic hypoxemic respiratory failure on home oxygen therapy, mMRC 2 functional status, former smoker currently enrolled in our lung cancer screening program last CT scan was May 2024, lung RADS 2 with no concerning findings recommending a 1 year follow-up.  Her weight has been stable which she has had no recent exacerbations in the past year managed on triple therapy inhaler regimen.  Plan: Continue vitamin D supplementation Continue her triple therapy inhaler regimen with Breztri twice daily Continue albuterol as needed Continue annual low-dose lung cancer screening CT to be completed in May 2025.  Return to clinic 1 year after CT imaging for COPD follow-up.   [name], DO [facility] 11/08/2022 1:24 PM

## 2022-11-08 NOTE — Patient Instructions (Addendum)
Thank you for visiting Dr. Tonia Brooms at [facility]. Today we recommend the following:  Annual low dose screening ct for next year  Continue Breztri inhaler and albuterol as needed  Return in about 1 year (around 11/08/2023) for with APP or Dr. Tonia Brooms.    Please do your part to reduce the spread of COVID-19.

## 2022-11-09 ENCOUNTER — Ambulatory Visit (INDEPENDENT_AMBULATORY_CARE_PROVIDER_SITE_OTHER): Payer: 59 | Admitting: Neurology

## 2022-11-09 ENCOUNTER — Encounter: Payer: Self-pay | Admitting: Neurology

## 2022-11-09 VITALS — BP 110/64 | HR 75 | Ht 68.0 in | Wt 137.0 lb

## 2022-11-09 DIAGNOSIS — G6289 Other specified polyneuropathies: Secondary | ICD-10-CM

## 2022-11-09 MED ORDER — PREGABALIN 25 MG PO CAPS: 25.0000 mg | ORAL_CAPSULE | Freq: Three times a day (TID) | ORAL | 5 refills | Status: DC

## 2022-11-09 NOTE — Patient Instructions (Signed)
Continue with Duloxetine 90 mg daily  Start with Pregabalin 25 mg twice daily, can increase to three times daily if needed  Continue your other medications  Return in 6 months or sooner if worse

## 2022-11-09 NOTE — Progress Notes (Signed)
[facility]  PATIENT: Vickie Bowman DOB: 02-23-1951  REQUESTING CLINICIAN: Carlean Jews, NP HISTORY FROM: Patient  REASON FOR VISIT: Bilateral feet and hands numbness    HISTORICAL  CHIEF COMPLAINT:  Chief Complaint  Patient presents with   Follow-up    Rm 12, alone, polyneuropathy, somewhat in proved, would like something stronger    INTERVAL HISTORY 11/09/2022:  Patient presents today for follow-up, she is alone.  At last visit in December we had increased her duloxetine to 90 mg daily.  She reports the increasing in duloxetine has helped her neuropathy but she still experiencing some pain. Other than that she reports she is doing well, at time she will try the Aspercreme.  She has recently follow-up with the pulmonology and cardiology and they told her that everything is stable.  No other complaints.   INTERVAL HISTORY 05/03/2022: Patient presents today for follow-up, at last visit we tried to prescribe pregabalin but she did not tolerate the side effect of being drowsy.  We also started her on a lidocaine patch (ZTLido) but she reports that it was not helpful.  She feels that the duloxetine has been helpful to decrease her neuropathic pain and would like to increase on the medication.  Otherwise she is doing well no other complaints.   INTERVAL HISTORY 10/26/21:  Patient presents today for follow-up, last visit was in December.  At that time plan was to start duloxetine 60 mg daily.  She is compliant with the medication, states that it helps with the neuropathy but she still feels the pain mostly at night.  She has not tried any additional medication for it.  Again pain is described as tingling and burning with constant numbness in both feet and hands.  Sample given to patient Ztlido   HISTORY OF PRESENT ILLNESS:  This is a 72 year old woman with past medical history of anxiety and panic attack, COPD on additional oxygen who is presenting with numbness  in both feet and hands.  Patient stated numbness started in the past 34-month, she notices more when going to bed.  Denies any pain in the lower extremities, denies any back pain and denies any shooting pain.  She reported trying gabapentin in the past without benefit. Currently she is not on medication for numbness. Denies any history of diabetes.  Denies any falls.     OTHER MEDICAL CONDITIONS: Anxiety, COPD, Panic attack   REVIEW OF SYSTEMS: Full 14 system review of systems performed and negative with exception of: as noted in the HPI   ALLERGIES: Allergies  Allergen Reactions   Penicillins Swelling    Has patient had a PCN reaction causing immediate rash, facial/tongue/throat swelling, SOB or lightheadedness with hypotension: Yes Has patient had a PCN reaction causing severe rash involving mucus membranes or skin necrosis: No Has patient had a PCN reaction that required hospitalization: No Has patient had a PCN reaction occurring within the last 10 years: No If all of the above answers are "NO", then may proceed with Cephalosporin use.     HOME MEDICATIONS: Outpatient Medications Prior to Visit  Medication Sig Dispense Refill   albuterol (PROVENTIL) (2.5 MG/3ML) 0.083% nebulizer solution Take 3 mLs (2.5 mg total) by nebulization every 6 (six) hours as needed for wheezing or shortness of breath. 75 mL 12   albuterol (VENTOLIN HFA) 108 (90 Base) MCG/ACT inhaler Inhale 2 puffs into the lungs every 6 (six) hours as needed for wheezing or shortness of breath. 8 g 2  ALPRAZolam (XANAX) 1 MG tablet Take 1 tablet (1 mg total) by mouth 2 (two) times daily. 60 tablet 2   aspirin EC 81 MG EC tablet Take 1 tablet (81 mg total) by mouth daily. 30 tablet 0   BREZTRI AEROSPHERE 160-9-4.8 MCG/ACT AERO Inhale 2 puffs into the lungs in the morning and at bedtime. 10.7 g 11   Cholecalciferol (VITAMIN D) 2000 units CAPS Take 1 capsule (2,000 Units total) by mouth daily. 30 capsule 5   cyanocobalamin  (VITAMIN B12) 1000 MCG tablet Take 1 tablet (1,000 mcg total) by mouth daily. 30 tablet 6   DULoxetine (CYMBALTA) 30 MG capsule Take 1 capsule (30 mg total) by mouth daily. Take a additional 60 mg for a total of 90 mg 30 capsule 6   DULoxetine (CYMBALTA) 60 MG capsule Take 1 capsule (60 mg total) by mouth daily. Take for an additional 30 mg for a total of 90 mg 30 capsule 3   HYDROcodone-acetaminophen (NORCO) 7.5-325 MG tablet Take 1 tablet by mouth 2 (two) times daily as needed. 60 tablet 0   ipratropium-albuterol (DUONEB) 0.5-2.5 (3) MG/3ML SOLN Take 3 mLs by nebulization every 6 (six) hours as needed. 90 mL 3   meloxicam (MOBIC) 7.5 MG tablet Take 1 tablet (7.5 mg total) by mouth daily. 90 tablet 1   metoprolol succinate (TOPROL-XL) 25 MG 24 hr tablet Take 0.5 tablets (12.5 mg total) by mouth daily. 90 tablet 1   Spacer/Aero-Holding Chambers (AEROCHAMBER MV) inhaler Use as instructed 1 each 0   terbinafine (LAMISIL AT) 1 % cream Apply 1 Application topically 2 (two) times daily. 42 g 2   Lidocaine (ZTLIDO) 1.8 % PTCH Apply 1 patch topically daily. 30 patch 2   ciprofloxacin (CIPRO) 500 MG tablet Take 1 tablet (500 mg total) by mouth 2 (two) times daily. (Patient not taking: Reported on 11/09/2022) 14 tablet 0   predniSONE (STERAPRED UNI-PAK 21 TAB) 10 MG (21) TBPK tablet 6 day taper - take by mouth as directed for 6 days (Patient not taking: Reported on 11/09/2022) 21 tablet 0   rosuvastatin (CRESTOR) 20 MG tablet Take 1 tablet (20 mg total) by mouth daily. NEED OV. (Patient not taking: Reported on 11/09/2022) 90 tablet 0   sulfamethoxazole-trimethoprim (BACTRIM) 400-80 MG tablet Take 1 tablet by mouth 2 (two) times daily. (Patient not taking: Reported on 11/09/2022) 14 tablet 0   No facility-administered medications prior to visit.    PAST MEDICAL HISTORY: Past Medical History:  Diagnosis Date   Anxiety    CHF (congestive heart failure) (HCC)    COPD (chronic obstructive pulmonary disease)  (HCC)    Lumbar herniated disc    Tobacco abuse     PAST SURGICAL HISTORY: Past Surgical History:  Procedure Laterality Date   LEFT HEART CATH AND CORONARY ANGIOGRAPHY N/A 02/05/2018   Procedure: LEFT HEART CATH AND CORONARY ANGIOGRAPHY;  Surgeon: Yvonne Kendall, MD;  Location: [facility];  Service: Cardiovascular;  Laterality: N/A;    FAMILY HISTORY: Family History  Problem Relation Age of Onset   Liver cancer Mother    Breast cancer Sister    CAD Neg Hx     SOCIAL HISTORY: Social History   Socioeconomic History   Marital status: Widowed    Spouse name: Not on file   Number of children: Not on file   Years of education: Not on file   Highest education level: Not on file  Occupational History   Not on file  Tobacco Use  Smoking status: Former    Packs/day: 1.50    Years: 53.00    Additional pack years: 0.00    Total pack years: 79.50    Types: Cigarettes    Start date: 05/24/1967    Quit date: 01/07/2018    Years since quitting: 4.8   Smokeless tobacco: Never   Tobacco comments:    Smoked since age 50, quit 12/2017  Vaping Use   Vaping Use: Never used  Substance and Sexual Activity   Alcohol use: Yes    Comment: Rare, glass of wine no more than every few weeks   Drug use: Never   Sexual activity: Not Currently  Other Topics Concern   Not on file  Social History Narrative   Right handed   Lives with brother   Caffeine 1 cup coffee   Social Determinants of Health   Financial Resource Strain: Low Risk  (10/27/2022)   Overall Financial Resource Strain (CARDIA)    Difficulty of Paying Living Expenses: Not hard at all  Food Insecurity: No Food Insecurity (10/27/2022)   Hunger Vital Sign    Worried About Running Out of Food in the Last Year: Never true    Ran Out of Food in the Last Year: Never true  Transportation Needs: No Transportation Needs (10/27/2022)   PRAPARE - Administrator, Civil Service (Medical): No    Lack of Transportation  (Non-Medical): No  Physical Activity: Insufficiently Active (10/27/2022)   Exercise Vital Sign    Days of Exercise per Week: 2 days    Minutes of Exercise per Session: 20 min  Stress: No Stress Concern Present (10/27/2022)   [employer] of Occupational Health - Occupational Stress Questionnaire    Feeling of Stress : Not at all  Social Connections: Socially Isolated (10/27/2022)   Social Connection and Isolation Panel [NHANES]    Frequency of Communication with Friends and Family: More than three times a week    Frequency of Social Gatherings with Friends and Family: More than three times a week    Attends Religious Services: Never    Database administrator or Organizations: No    Attends Banker Meetings: Never    Marital Status: Widowed  Intimate Partner Violence: Not At Risk (10/27/2022)   Humiliation, Afraid, Rape, and Kick questionnaire    Fear of Current or Ex-Partner: No    Emotionally Abused: No    Physically Abused: No    Sexually Abused: No     PHYSICAL EXAM  GENERAL EXAM/CONSTITUTIONAL: Vitals:  Vitals:   11/09/22 1349  BP: 110/64  Pulse: 75  SpO2: 94%  Weight: 137 lb (62.1 kg)  Height: 5\' 8"  (1.727 m)     Body mass index is 20.83 kg/m. Wt Readings from Last 3 Encounters:  11/09/22 137 lb (62.1 kg)  11/08/22 138 lb (62.6 kg)  11/07/22 138 lb 6.4 oz (62.8 kg)   Patient is in no distress; well developed, nourished and groomed; neck is supple  MUSCULOSKELETAL: Gait, strength, tone, movements noted in Neurologic exam below  NEUROLOGIC: MENTAL STATUS:      No data to display         awake, alert, oriented to person, place and time recent and remote memory intact normal attention and concentration language fluent, comprehension intact, naming intact fund of knowledge appropriate  CRANIAL NERVE:  2nd, 3rd, 4th, 6th -visual fields full to confrontation, extraocular muscles intact, no nystagmus 5th - facial sensation symmetric 7th -  facial strength  symmetric 8th - hearing intact 9th - palate elevates symmetrically, uvula midline 11th - shoulder shrug symmetric 12th - tongue protrusion midline  MOTOR:  normal bulk and tone, full strength in the BUE, BLE  SENSORY:  Decrease vibration up to ankle bilaterally   COORDINATION:  finger-nose-finger, fine finger movements normal  GAIT/STATION:  normal   DIAGNOSTIC DATA (LABS, IMAGING, TESTING) - I reviewed patient records, labs, notes, testing and imaging myself where available.  Lab Results  Component Value Date   WBC 5.7 07/01/2020   HGB 13.6 07/01/2020   HCT 41.2 07/01/2020   MCV 90 07/01/2020   PLT 192 07/01/2020      Component Value Date/Time   NA 144 07/01/2020 1430   K 4.8 07/01/2020 1430   CL 106 07/01/2020 1430   CO2 23 07/01/2020 1430   GLUCOSE 89 07/01/2020 1430   GLUCOSE 154 (H) 03/05/2018 0327   BUN 15 07/01/2020 1430   CREATININE 0.85 07/01/2020 1430   CALCIUM 9.6 07/01/2020 1430   PROT 6.2 07/01/2020 1430   ALBUMIN 4.5 07/01/2020 1430   AST 18 07/01/2020 1430   ALT 19 07/01/2020 1430   ALKPHOS 85 07/01/2020 1430   BILITOT 0.4 07/01/2020 1430   GFRNONAA 69 07/01/2020 1430   GFRAA 79 07/01/2020 1430   Lab Results  Component Value Date   CHOL 129 07/01/2020   HDL 59 07/01/2020   LDLCALC 53 07/01/2020   TRIG 86 07/01/2020   CHOLHDL 2.2 07/01/2020   Lab Results  Component Value Date   HGBA1C 5.7 (H) 04/20/2021   Lab Results  Component Value Date   VITAMINB12 310 04/20/2021   Lab Results  Component Value Date   TSH 2.950 04/20/2021     ASSESSMENT AND PLAN  72 y.o. year old female with anxiety/panic attack, COPD on home oxygen who is presenting for follow up for her neuropathy.  She is on duloxetine 90 mg daily, will add back pregabalin, low-dose 25 mg twice daily, can increase to 3 times daily as tolerated.  Continue other medications. Continue to follow-up with your doctors and return in 6 months or sooner if  worse.    1. Other polyneuropathy      Patient Instructions  Continue with Duloxetine 90 mg daily  Start with Pregabalin 25 mg twice daily, can increase to three times daily if needed  Continue your other medications  Return in 6 months or sooner if worse    No orders of the defined types were placed in this encounter.   Meds ordered this encounter  Medications   pregabalin (LYRICA) 25 MG capsule    Sig: Take 1 capsule (25 mg total) by mouth 3 (three) times daily.    Dispense:  30 capsule    Refill:  5    Return in about 6 months (around 05/11/2023).   Windell Norfolk, MD 11/09/2022, 2:10 PM  [facility] [address], [address] [place], [place] [address] [phone]

## 2022-11-10 ENCOUNTER — Telehealth: Payer: Self-pay | Admitting: Neurology

## 2022-11-10 NOTE — Telephone Encounter (Signed)
Pt returning phone call ° °

## 2022-11-10 NOTE — Telephone Encounter (Signed)
Pt is asking for a call to discuss her taking pregabalin (LYRICA) 25 MG capsule along with XANAX because she has been told that pregabalin (LYRICA) 25 MG capsule will make her sleepy, please call pt to discuss.

## 2022-11-10 NOTE — Telephone Encounter (Signed)
Returned call to pt and reiterated the recommendations.

## 2022-11-10 NOTE — Telephone Encounter (Signed)
Please inform patient that is it ok to take the medication as night for her neuropathy.

## 2022-11-10 NOTE — Telephone Encounter (Signed)
Returned call to pt who stated that she feels like the lyrica and the xanax might make her sleepy because she was advised of that by pharmd while picking up med. She wanted to make sure that that is really ok to take both. She hasn't started taking them yet.

## 2022-11-10 NOTE — Telephone Encounter (Signed)
Call to patient, advised to take lyrica at bedtime and she can also take xanax but be mindful when driving and give time between taking lyrica and xanax. Patient verbalized understanding.

## 2022-11-10 NOTE — Telephone Encounter (Signed)
Pt stated she would like for nurse that call her to please call back. Stated nurse was talking and just hung up.

## 2022-11-16 ENCOUNTER — Ambulatory Visit (INDEPENDENT_AMBULATORY_CARE_PROVIDER_SITE_OTHER): Payer: 59 | Admitting: Podiatry

## 2022-11-16 ENCOUNTER — Encounter: Payer: Self-pay | Admitting: Podiatry

## 2022-11-16 VITALS — BP 120/71

## 2022-11-16 DIAGNOSIS — G629 Polyneuropathy, unspecified: Secondary | ICD-10-CM | POA: Diagnosis not present

## 2022-11-16 DIAGNOSIS — M79675 Pain in left toe(s): Secondary | ICD-10-CM | POA: Diagnosis not present

## 2022-11-16 DIAGNOSIS — B351 Tinea unguium: Secondary | ICD-10-CM

## 2022-11-16 DIAGNOSIS — M79674 Pain in right toe(s): Secondary | ICD-10-CM

## 2022-11-16 NOTE — Assessment & Plan Note (Signed)
Take meloxicam 7.5 mg daily to reduce pain and inflammation  May take hydrocodone/APAP 7.5/325 mg up to twice daily if needed for severe pain.  -new prescription sent to her pharmacy

## 2022-11-16 NOTE — Assessment & Plan Note (Signed)
Stable.  -continue regular visits with cardiology as scheduled.

## 2022-11-16 NOTE — Assessment & Plan Note (Signed)
Stable.  -continue inhalers and respiratory medications as prescribed  -continue with nasal cannula oxygen as prescribed  -follow up with pulmonology as scheduled.

## 2022-11-16 NOTE — Assessment & Plan Note (Signed)
Patient unable to void today -reviewed u/a and culture results from most recent check.  -will start bactrim single strength, twice daily for 7 days.  -advised her to contact the office if symptoms do not improve. She voiced agreement and understanding .

## 2022-11-16 NOTE — Assessment & Plan Note (Signed)
May continue to take alprazolam as needed and as previously prescribed  -new prescription sent to pharmacy

## 2022-11-20 NOTE — Progress Notes (Signed)
  Subjective:  Patient ID: Vickie Bowman, female    DOB: [birth year],  MRN: [id number]  Vickie Bowman presents to clinic today for: at risk foot care with history of peripheral neuropathy and painful elongated mycotic toenails 1-5 bilaterally which are tender when wearing enclosed shoe gear. Pain is relieved with periodic professional debridement.  Chief Complaint  Patient presents with   Nail Problem    RFC,Referring Provider Carlean Jews, NP,lov:06/24       PCP is Carlean Jews, NP.  Allergies  Allergen Reactions   Penicillins Swelling    Has patient had a PCN reaction causing immediate rash, facial/tongue/throat swelling, SOB or lightheadedness with hypotension: Yes Has patient had a PCN reaction causing severe rash involving mucus membranes or skin necrosis: No Has patient had a PCN reaction that required hospitalization: No Has patient had a PCN reaction occurring within the last 10 years: No If all of the above answers are "NO", then may proceed with Cephalosporin use.     Review of Systems: Negative except as noted in the HPI.  Objective: No changes noted in today's physical examination. Vitals:   11/16/22 1424  BP: 120/71    Vickie Bowman is a pleasant 72 y.o. female in NAD. AAO x 3.  Vascular Examination: Capillary refill time <3 seconds b/l LE. Palpable pedal pulses b/l LE. Digital hair absent b/l. No pedal edema b/l. Skin temperature gradient WNL b/l. No varicosities b/l. Marland Kitchen  Dermatological Examination: Pedal skin with normal turgor, texture and tone b/l. No open wounds. No interdigital macerations b/l. Toenails 1-5 b/l thickened, discolored, dystrophic with subungual debris. There is pain on palpation to dorsal aspect of nailplates.  Neurological Examination: Protective sensation intact with 10 gram monofilament b/l LE. Vibratory sensation intact b/l LE. Pt has subjective symptoms of neuropathy.  Musculoskeletal Examination: Muscle strength 5/5 to  all lower extremity muscle groups bilaterally. Hallux valgus with bunion deformity noted b/l lower extremities. Severe hammertoe deformity noted right second digit.  Assessment/Plan: 1. Pain due to onychomycosis of toenails of both feet   2. Polyneuropathy     -Consent given for treatment as described below: -Examined patient. -Continue supportive shoe gear daily. -Toenails 1-5 b/l were debrided in length and girth with sterile nail nippers and dremel without iatrogenic bleeding.  -Patient/POA to call should there be question/concern in the interim.   Return in about 9 weeks (around 01/18/2023).  Freddie Breech, DPM

## 2022-11-22 ENCOUNTER — Other Ambulatory Visit: Payer: Self-pay | Admitting: Neurology

## 2022-12-08 ENCOUNTER — Telehealth: Payer: Self-pay | Admitting: Pulmonary Disease

## 2022-12-08 NOTE — Telephone Encounter (Signed)
Pt called in bc she was talking to a lady in the grocery store, and says her husband is on trelegy and it helps him as well as a pill that starts with a d but can't remember the name

## 2022-12-09 ENCOUNTER — Other Ambulatory Visit: Payer: Self-pay | Admitting: Neurology

## 2022-12-09 DIAGNOSIS — G6289 Other specified polyneuropathies: Secondary | ICD-10-CM

## 2022-12-09 NOTE — Telephone Encounter (Signed)
Spoke with patient. I advised I would make ov to discuss treatment options. Patient states she is fine for now and will think about. If she wants a appointment she will call the office back next week. Closing encounter for now.

## 2022-12-09 NOTE — Telephone Encounter (Signed)
Spoke with patient. She states she was talking to someone and they told her that their spouse is currently on a medication for COPD that starts with the letter D. She states he really seemed to help him but is unsure of the name.  The only medication I can think of is Daliresp? She wants to know if this medication could be beneficial for her? Dr. Tonia Brooms please advise

## 2022-12-15 ENCOUNTER — Telehealth: Payer: Self-pay

## 2022-12-15 DIAGNOSIS — G8929 Other chronic pain: Secondary | ICD-10-CM

## 2022-12-15 NOTE — Telephone Encounter (Signed)
Prescription Request  12/15/2022  LOV: 10/26/22  What is the name of the medication or equipment? HYDROcodone-acetaminophen (NORCO) 7.5-325 MG tablet   Have you contacted your pharmacy to request a refill? Yes   Which pharmacy would you like this sent to?  [id number] - [place], [place] - [address] [place] [address] Phone: [phone] Fax: [phone]  Patient notified that their request is being sent to the clinical staff for review and that they should receive a response within 2 business days.   Please advise at Mobile [phone] (mobile)

## 2022-12-16 MED ORDER — HYDROCODONE-ACETAMINOPHEN 7.5-325 MG PO TABS
1.0000 | ORAL_TABLET | Freq: Two times a day (BID) | ORAL | 0 refills | Status: DC | PRN
Start: 2022-12-16 — End: 2023-01-26

## 2022-12-16 NOTE — Telephone Encounter (Signed)
Refill has been sent, next  appt here on 01/26/2023  Meds ordered this encounter  Medications   HYDROcodone-acetaminophen (NORCO) 7.5-325 MG tablet    Sig: Take 1 tablet by mouth 2 (two) times daily as needed.    Dispense:  60 tablet    Refill:  0    Order Specific Question:   Supervising Provider    Answer:   Nani Gasser D [[id number]]

## 2022-12-16 NOTE — Addendum Note (Signed)
Addended by: Saralyn Pilar on: 12/16/2022 08:12 AM   Modules accepted: Orders

## 2022-12-25 ENCOUNTER — Other Ambulatory Visit: Payer: Self-pay | Admitting: Neurology

## 2023-01-01 ENCOUNTER — Other Ambulatory Visit: Payer: Self-pay | Admitting: Pulmonary Disease

## 2023-01-15 ENCOUNTER — Other Ambulatory Visit (HOSPITAL_BASED_OUTPATIENT_CLINIC_OR_DEPARTMENT_OTHER): Payer: Self-pay | Admitting: Internal Medicine

## 2023-01-25 ENCOUNTER — Other Ambulatory Visit: Payer: Self-pay | Admitting: Neurology

## 2023-01-26 ENCOUNTER — Other Ambulatory Visit: Payer: Self-pay | Admitting: *Deleted

## 2023-01-26 ENCOUNTER — Ambulatory Visit (INDEPENDENT_AMBULATORY_CARE_PROVIDER_SITE_OTHER): Payer: 59 | Admitting: Family Medicine

## 2023-01-26 ENCOUNTER — Encounter: Payer: Self-pay | Admitting: Family Medicine

## 2023-01-26 VITALS — BP 102/68 | HR 73 | Ht 68.0 in | Wt 144.8 lb

## 2023-01-26 DIAGNOSIS — M5442 Lumbago with sciatica, left side: Secondary | ICD-10-CM | POA: Diagnosis not present

## 2023-01-26 DIAGNOSIS — F411 Generalized anxiety disorder: Secondary | ICD-10-CM | POA: Diagnosis not present

## 2023-01-26 DIAGNOSIS — Z1159 Encounter for screening for other viral diseases: Secondary | ICD-10-CM | POA: Diagnosis not present

## 2023-01-26 DIAGNOSIS — Z131 Encounter for screening for diabetes mellitus: Secondary | ICD-10-CM | POA: Diagnosis not present

## 2023-01-26 DIAGNOSIS — N3090 Cystitis, unspecified without hematuria: Secondary | ICD-10-CM | POA: Diagnosis not present

## 2023-01-26 DIAGNOSIS — Z1329 Encounter for screening for other suspected endocrine disorder: Secondary | ICD-10-CM | POA: Diagnosis not present

## 2023-01-26 DIAGNOSIS — I5021 Acute systolic (congestive) heart failure: Secondary | ICD-10-CM

## 2023-01-26 DIAGNOSIS — J449 Chronic obstructive pulmonary disease, unspecified: Secondary | ICD-10-CM

## 2023-01-26 DIAGNOSIS — Z780 Asymptomatic menopausal state: Secondary | ICD-10-CM | POA: Diagnosis not present

## 2023-01-26 DIAGNOSIS — E785 Hyperlipidemia, unspecified: Secondary | ICD-10-CM | POA: Diagnosis not present

## 2023-01-26 DIAGNOSIS — G8929 Other chronic pain: Secondary | ICD-10-CM

## 2023-01-26 DIAGNOSIS — M5441 Lumbago with sciatica, right side: Secondary | ICD-10-CM

## 2023-01-26 MED ORDER — BREZTRI AEROSPHERE 160-9-4.8 MCG/ACT IN AERO
2.0000 | INHALATION_SPRAY | Freq: Two times a day (BID) | RESPIRATORY_TRACT | 0 refills | Status: DC
Start: 2023-01-26 — End: 2023-03-24

## 2023-01-26 MED ORDER — ALPRAZOLAM 1 MG PO TABS
1.0000 mg | ORAL_TABLET | Freq: Two times a day (BID) | ORAL | 2 refills | Status: DC
Start: 2023-02-06 — End: 2023-04-27

## 2023-01-26 MED ORDER — ACETAMINOPHEN ER 650 MG PO TBCR
650.0000 mg | EXTENDED_RELEASE_TABLET | Freq: Three times a day (TID) | ORAL | 1 refills | Status: DC | PRN
Start: 2023-01-26 — End: 2023-03-10

## 2023-01-26 NOTE — Telephone Encounter (Signed)
Opened in error.  Contacted pharmacy and they did receive the Rx for acetaminophen so did not need to resend.

## 2023-01-26 NOTE — Assessment & Plan Note (Signed)
We discussed that taking hydrocodone increases her risk of falls, delirium, and has potential interactions with Xanax and pregabalin.  We discussed maximizing the acetaminophen component of her current prescription and cutting back or discontinuing the hydrocodone-acetaminophen combination.  Patient is agreeable to a trial, start acetaminophen 650 mg twice a day.  She can take it up to 4 times a day if necessary for breakthrough pain.  If she does require something stronger, recommend that neurology take over that prescription as they are managing her back pain otherwise or we will refer to pain management.

## 2023-01-26 NOTE — Patient Instructions (Addendum)
Since the hydrocodone-acetaminophen combination medicine (Norco) is not safe to take with some of your other medicines, we will switch it with a high dose of acetaminophen. Take the high dose acetaminophen 650 mg twice daily for breakthrough pain when needed, just like you used to take the Norco. If you need to, you can increase to taking acetaminophen three times a day if you need additional pain control.  I am also going to send in a referral to a urologist since you keep getting UTIs.

## 2023-01-26 NOTE — Progress Notes (Signed)
Established Patient Office Visit  Subjective   Patient ID: Vickie Bowman, female    DOB: 1950-06-27  Age: 72 y.o. MRN: [id number]  Chief Complaint  Patient presents with   Medical Management of Chronic Issues    HPI Vickie Bowman is a 72 y.o. female presenting today for follow up of chronic back pain, mood. Back Pain: Patient presents for presents follow-up of low back problems.  She is seen by neurology, they are prescribing Cymbalta and pregabalin.  Primary care has been managing prescription of hydrocodone-acetaminophen for quite some time.  She states that in general, back pain is fairly well-controlled.  She will take hydrocodone-acetaminophen 2-3 times a week. Mood: Patient is here to follow up for anxiety, currently managing with Xanax twice daily. Taking medication without side effects, reports excellent compliance with treatment. Denies mood changes or SI/HI. She feels mood is stable since last visit.     01/26/2023    2:28 PM 10/27/2022    1:12 PM 10/26/2022    1:41 PM  Depression screen PHQ 2/9  Decreased Interest 0 0 0  Down, Depressed, Hopeless 0 0 0  PHQ - 2 Score 0 0 0  Altered sleeping 0 0 0  Tired, decreased energy 0 0 0  Change in appetite 0 0 0  Feeling bad or failure about yourself  0 0 0  Trouble concentrating 0 0 0  Moving slowly or fidgety/restless 0 0 0  Suicidal thoughts 0 0 0  PHQ-9 Score 0 0 0  Difficult doing work/chores Not difficult at all Not difficult at all Not difficult at all       06/22/2022    2:56 PM 03/16/2022    1:37 PM 02/15/2022    2:18 PM 01/13/2022    1:37 PM  GAD 7 : Generalized Anxiety Score  Nervous, Anxious, on Edge 0 0 0 1  Control/stop worrying 0 0 0 0  Worry too much - different things 0 0 0 0  Trouble relaxing 0 0 0 0  Restless 0 0 0 0  Easily annoyed or irritable 0 0 0 0  Afraid - awful might happen 0 0 0 0  Total GAD 7 Score 0 0 0 1   ROS Negative unless otherwise noted in HPI   Objective:     BP 102/68    Pulse 73   Ht 5\' 8"  (1.727 m)   Wt 144 lb 12.8 oz (65.7 kg)   SpO2 98%   BMI 22.02 kg/m   Physical Exam Constitutional:      General: She is not in acute distress.    Appearance: Normal appearance.  HENT:     Head: Normocephalic and atraumatic.  Cardiovascular:     Rate and Rhythm: Normal rate and regular rhythm.     Heart sounds: No murmur heard.    No friction rub. No gallop.  Pulmonary:     Effort: Pulmonary effort is normal. No respiratory distress.     Breath sounds: Rhonchi present. No wheezing or rales.  Skin:    General: Skin is warm and dry.  Neurological:     Mental Status: She is alert and oriented to person, place, and time.     Assessment & Plan:  Generalized anxiety disorder Assessment & Plan: PHQ-9 and GAD-7 scores of 0.  Stable.  For now, continue Xanax 1 mg twice daily.  In the future, we will work towards weaning off of Xanax due to increased risk in geriatric population as  well as habit-forming risk.  Orders: -     ALPRAZolam; Take 1 tablet (1 mg total) by mouth 2 (two) times daily.  Dispense: 60 tablet; Refill: 2  Chronic midline low back pain with bilateral sciatica Assessment & Plan: We discussed that taking hydrocodone increases her risk of falls, delirium, and has potential interactions with Xanax and pregabalin.  We discussed maximizing the acetaminophen component of her current prescription and cutting back or discontinuing the hydrocodone-acetaminophen combination.  Patient is agreeable to a trial, start acetaminophen 650 mg twice a day.  She can take it up to 4 times a day if necessary for breakthrough pain.  If she does require something stronger, recommend that neurology take over that prescription as they are managing her back pain otherwise or we will refer to pain management.  Orders: -     Acetaminophen ER; Take 1 tablet (650 mg total) by mouth every 8 (eight) hours as needed for pain.  Dispense: 90 tablet; Refill: 1  Recurrent cystitis -      Ambulatory referral to Urology  Acute HFrEF (heart failure with reduced ejection fraction) (HCC) -     CBC with Differential/Platelet; Future -     Comprehensive metabolic panel; Future  Hyperlipidemia LDL goal <70 -     CBC with Differential/Platelet; Future -     Comprehensive metabolic panel; Future -     Lipid panel; Future  Screening for viral disease -     Hepatitis C antibody; Future  Stage 2 moderate COPD by GOLD classification (HCC) -     Breztri Aerosphere; Inhale 2 puffs into the lungs in the morning and at bedtime.  Dispense: 11 g; Refill: 0 -     CBC with Differential/Platelet; Future -     Comprehensive metabolic panel; Future  Screening for diabetes mellitus -     Hemoglobin A1c; Future  Screening for thyroid disorder -     TSH Rfx on Abnormal to Free T4; Future  Postmenopausal -     VITAMIN D 25 Hydroxy (Vit-D Deficiency, Fractures); Future    Return in about 3 months (around 04/27/2023) for follow-up.    Vickie Quitter, PA

## 2023-01-26 NOTE — Addendum Note (Signed)
Addended by: Saralyn Pilar on: 01/26/2023 05:04 PM   Modules accepted: Orders

## 2023-01-26 NOTE — Assessment & Plan Note (Signed)
PHQ-9 and GAD-7 scores of 0.  Stable.  For now, continue Xanax 1 mg twice daily.  In the future, we will work towards weaning off of Xanax due to increased risk in geriatric population as well as habit-forming risk.

## 2023-01-28 ENCOUNTER — Other Ambulatory Visit: Payer: Self-pay | Admitting: Neurology

## 2023-01-30 ENCOUNTER — Telehealth: Payer: Self-pay | Admitting: Neurology

## 2023-01-30 ENCOUNTER — Ambulatory Visit (INDEPENDENT_AMBULATORY_CARE_PROVIDER_SITE_OTHER): Payer: 59 | Admitting: Podiatry

## 2023-01-30 DIAGNOSIS — B351 Tinea unguium: Secondary | ICD-10-CM

## 2023-01-30 DIAGNOSIS — B353 Tinea pedis: Secondary | ICD-10-CM

## 2023-01-30 DIAGNOSIS — M79675 Pain in left toe(s): Secondary | ICD-10-CM | POA: Diagnosis not present

## 2023-01-30 DIAGNOSIS — G6289 Other specified polyneuropathies: Secondary | ICD-10-CM

## 2023-01-30 DIAGNOSIS — M79674 Pain in right toe(s): Secondary | ICD-10-CM | POA: Diagnosis not present

## 2023-01-30 MED ORDER — KETOCONAZOLE 2 % EX CREA
1.0000 | TOPICAL_CREAM | Freq: Every day | CUTANEOUS | 2 refills | Status: AC
Start: 2023-01-30 — End: ?

## 2023-01-30 MED ORDER — DULOXETINE HCL 60 MG PO CPEP: 60.0000 mg | ORAL_CAPSULE | Freq: Every day | ORAL | 0 refills | Status: DC

## 2023-01-30 MED ORDER — CICLOPIROX 8 % EX SOLN: Freq: Every day | CUTANEOUS | 11 refills | Status: DC

## 2023-01-30 MED ORDER — DULOXETINE HCL 30 MG PO CPEP: 30.0000 mg | ORAL_CAPSULE | Freq: Every day | ORAL | 0 refills | Status: DC

## 2023-01-30 NOTE — Progress Notes (Unsigned)
Subjective:  Patient ID: Vickie Bowman, female    DOB: 11-30-1950,  MRN: [id number]  Vickie Bowman presents to clinic today for: No chief complaint on file.  Patient notes nails are thick, discolored, elongated and painful in shoegear when trying to ambulate.  She has peeling along the right heel with some itching noted. She is requesting medication to treat her nail fungus.  She pointed out she wants her nails cut straight across, but as short as possible today.  PCP is Melida Quitter, PA.  Past Medical History:  Diagnosis Date   Anxiety    CHF (congestive heart failure) (HCC)    COPD (chronic obstructive pulmonary disease) (HCC)    Lumbar herniated disc    Tobacco abuse     Past Surgical History:  Procedure Laterality Date   LEFT HEART CATH AND CORONARY ANGIOGRAPHY N/A 02/05/2018   Procedure: LEFT HEART CATH AND CORONARY ANGIOGRAPHY;  Surgeon: Yvonne Kendall, MD;  Location: [facility];  Service: Cardiovascular;  Laterality: N/A;    Allergies  Allergen Reactions   Penicillins Swelling    Has patient had a PCN reaction causing immediate rash, facial/tongue/throat swelling, SOB or lightheadedness with hypotension: Yes Has patient had a PCN reaction causing severe rash involving mucus membranes or skin necrosis: No Has patient had a PCN reaction that required hospitalization: No Has patient had a PCN reaction occurring within the last 10 years: No If all of the above answers are "NO", then may proceed with Cephalosporin use.     Review of Systems: Negative except as noted in the HPI.  Objective:  There were no vitals filed for this visit.  Vickie Bowman is a pleasant 72 y.o. female in NAD. AAO x 3.  Vascular Examination: Capillary refill time is 3-5 seconds to toes bilateral. Palpable pedal pulses b/l LE. Digital hair present b/l.  Skin temperature gradient WNL b/l. No varicosities b/l. No cyanosis noted b/l.   Dermatological Examination: Pedal  skin with normal turgor, texture and tone b/l. No open wounds. No interdigital macerations b/l. Toenails x10 are 3mm thick, discolored, dystrophic with subungual debris. There is pain with compression of the nail plates.  They are elongated x10.  Peeling along proximal right heel.  No blistering noted.  Assessment/Plan: 1. Pain due to onychomycosis of toenails of both feet   2. Tinea pedis of both feet     Meds ordered this encounter  Medications   ciclopirox (PENLAC) 8 % solution    Sig: Apply topically at bedtime. Apply over nail and surrounding skin. Apply daily over previous coat. Remove weekly with polish remover.    Dispense:  6.6 mL    Refill:  11   ketoconazole (NIZORAL) 2 % cream    Sig: Apply 1 Application topically daily.    Dispense:  60 g    Refill:  2   The mycotic toenails were sharply debrided x10 with sterile nail nippers and a power debriding burr to decrease bulk/thickness and length.  Rx ciclopirox sent in.  Advised patient how to apply and remove once weekly.  Rx ketoconazole cream  for every day application on plantar feet for tinea pedis  Return in about 3 months (around 05/01/2023) for RFC.   Clerance Lav, DPM, FACFAS [facility]     [address]  [place], [place] [address]                Office [phone]  Fax [phone]

## 2023-01-30 NOTE — Telephone Encounter (Signed)
Pt reports that she is out of her DULoxetine (CYMBALTA) 30 MG capsule and she really needs it called into [facility] [address]

## 2023-01-30 NOTE — Telephone Encounter (Signed)
Refillls sent

## 2023-01-31 ENCOUNTER — Telehealth: Payer: Self-pay | Admitting: Pulmonary Disease

## 2023-01-31 NOTE — Telephone Encounter (Signed)
Patient would like to know if she needs pneumonia vaccine. Has had one in the past. Patient phone number is [phone].

## 2023-02-03 NOTE — Telephone Encounter (Signed)
Spoke to patient and advised can get pneumonia vaccine at primary care doctors office.

## 2023-02-08 ENCOUNTER — Telehealth: Payer: Self-pay | Admitting: *Deleted

## 2023-02-08 NOTE — Telephone Encounter (Signed)
Pt LVM on machine requesting to change her lab appt, she did not answer when I tried to call her back.

## 2023-02-14 ENCOUNTER — Other Ambulatory Visit: Payer: 59

## 2023-02-15 ENCOUNTER — Telehealth: Payer: Self-pay | Admitting: Internal Medicine

## 2023-02-15 ENCOUNTER — Other Ambulatory Visit: Payer: 59

## 2023-02-15 DIAGNOSIS — R7989 Other specified abnormal findings of blood chemistry: Secondary | ICD-10-CM

## 2023-02-15 DIAGNOSIS — Z1329 Encounter for screening for other suspected endocrine disorder: Secondary | ICD-10-CM

## 2023-02-15 DIAGNOSIS — Z131 Encounter for screening for diabetes mellitus: Secondary | ICD-10-CM | POA: Diagnosis not present

## 2023-02-15 DIAGNOSIS — Z1159 Encounter for screening for other viral diseases: Secondary | ICD-10-CM

## 2023-02-15 DIAGNOSIS — I5021 Acute systolic (congestive) heart failure: Secondary | ICD-10-CM

## 2023-02-15 DIAGNOSIS — E785 Hyperlipidemia, unspecified: Secondary | ICD-10-CM | POA: Diagnosis not present

## 2023-02-15 DIAGNOSIS — Z780 Asymptomatic menopausal state: Secondary | ICD-10-CM | POA: Diagnosis not present

## 2023-02-15 DIAGNOSIS — J449 Chronic obstructive pulmonary disease, unspecified: Secondary | ICD-10-CM

## 2023-02-15 DIAGNOSIS — E569 Vitamin deficiency, unspecified: Secondary | ICD-10-CM | POA: Diagnosis not present

## 2023-02-15 NOTE — Telephone Encounter (Signed)
Pt stating that she got her blood work done at the [facility] and they stated we had to call to get her blood work sent over. Please advise

## 2023-02-15 NOTE — Telephone Encounter (Signed)
Tried to reach patient. Phone just rings x3

## 2023-02-16 LAB — LIPID PANEL
Chol/HDL Ratio: 2.4 ratio (ref 0.0–4.4)
Cholesterol, Total: 142 mg/dL (ref 100–199)
HDL: 59 mg/dL (ref >39)
LDL Chol Calc (NIH): 65 mg/dL (ref 0–99)
Triglycerides: 96 mg/dL (ref 0–149)
VLDL Cholesterol Cal: 18 mg/dL (ref 5–40)

## 2023-02-16 LAB — COMPREHENSIVE METABOLIC PANEL
ALT: 24 IU/L (ref 0–32)
AST: 25 IU/L (ref 0–40)
Albumin: 4.2 g/dL (ref 3.8–4.8)
Alkaline Phosphatase: 84 IU/L (ref 44–121)
BUN/Creatinine Ratio: 22 (ref 12–28)
BUN: 18 mg/dL (ref 8–27)
Bilirubin Total: 0.5 mg/dL (ref 0.0–1.2)
CO2: 26 mmol/L (ref 20–29)
Calcium: 9.2 mg/dL (ref 8.7–10.3)
Chloride: 104 mmol/L (ref 96–106)
Creatinine, Ser: 0.81 mg/dL (ref 0.57–1.00)
Globulin, Total: 2 g/dL (ref 1.5–4.5)
Glucose: 91 mg/dL (ref 70–99)
Potassium: 4.3 mmol/L (ref 3.5–5.2)
Sodium: 144 mmol/L (ref 134–144)
Total Protein: 6.2 g/dL (ref 6.0–8.5)
eGFR: 76 mL/min/{1.73_m2} (ref >59)

## 2023-02-16 LAB — HEMOGLOBIN A1C
Est. average glucose Bld gHb Est-mCnc: 120 mg/dL
Hgb A1c MFr Bld: 5.8 % — ABNORMAL HIGH (ref 4.8–5.6)

## 2023-02-16 LAB — CBC WITH DIFFERENTIAL/PLATELET
Basophils Absolute: 0.1 10*3/uL (ref 0.0–0.2)
Basos: 1 %
EOS (ABSOLUTE): 0.2 10*3/uL (ref 0.0–0.4)
Eos: 3 %
Hematocrit: 42.3 % (ref 34.0–46.6)
Hemoglobin: 13.7 g/dL (ref 11.1–15.9)
Immature Grans (Abs): 0 10*3/uL (ref 0.0–0.1)
Immature Granulocytes: 0 %
Lymphocytes Absolute: 1.3 10*3/uL (ref 0.7–3.1)
Lymphs: 20 %
MCH: 30.5 pg (ref 26.6–33.0)
MCHC: 32.4 g/dL (ref 31.5–35.7)
MCV: 94 fL (ref 79–97)
Monocytes Absolute: 0.5 10*3/uL (ref 0.1–0.9)
Monocytes: 8 %
Neutrophils Absolute: 4.4 10*3/uL (ref 1.4–7.0)
Neutrophils: 68 %
Platelets: 165 10*3/uL (ref 150–450)
RBC: 4.49 x10E6/uL (ref 3.77–5.28)
RDW: 12.8 % (ref 11.7–15.4)
WBC: 6.4 10*3/uL (ref 3.4–10.8)

## 2023-02-16 LAB — T4F: T4,Free (Direct): 0.89 ng/dL (ref 0.82–1.77)

## 2023-02-16 LAB — TSH RFX ON ABNORMAL TO FREE T4: TSH: 6.31 u[IU]/mL — ABNORMAL HIGH (ref 0.450–4.500)

## 2023-02-16 LAB — VITAMIN D 25 HYDROXY (VIT D DEFICIENCY, FRACTURES): Vit D, 25-Hydroxy: 53 ng/mL (ref 30.0–100.0)

## 2023-02-16 LAB — HEPATITIS C ANTIBODY: Hep C Virus Ab: NONREACTIVE

## 2023-02-16 NOTE — Telephone Encounter (Signed)
CBC, CMP, lipid panel, A1c, TSH drawn on 02/15/2023.  Results are now visible in epic for you!

## 2023-02-21 NOTE — Telephone Encounter (Signed)
Called pt. Relied Dr. Blanchie Dessert message. Pt thankful for the call. Pt has an appt Friday. She is requesting to [place] in office O2 when she comes to her appt.

## 2023-02-23 ENCOUNTER — Other Ambulatory Visit: Payer: Self-pay | Admitting: Neurology

## 2023-02-24 ENCOUNTER — Encounter: Payer: Self-pay | Admitting: Internal Medicine

## 2023-02-24 ENCOUNTER — Ambulatory Visit: Payer: 59 | Attending: Internal Medicine | Admitting: Internal Medicine

## 2023-02-24 VITALS — BP 114/84 | HR 74 | Ht 68.0 in | Wt 143.0 lb

## 2023-02-24 DIAGNOSIS — I5181 Takotsubo syndrome: Secondary | ICD-10-CM

## 2023-02-24 DIAGNOSIS — I2510 Atherosclerotic heart disease of native coronary artery without angina pectoris: Secondary | ICD-10-CM | POA: Diagnosis not present

## 2023-02-24 DIAGNOSIS — I5021 Acute systolic (congestive) heart failure: Secondary | ICD-10-CM | POA: Diagnosis not present

## 2023-02-24 DIAGNOSIS — E785 Hyperlipidemia, unspecified: Secondary | ICD-10-CM

## 2023-02-24 NOTE — Patient Instructions (Signed)
Medication Instructions:  NO CHANGES  *If you need a refill on your cardiac medications before your next appointment, please call your pharmacy*   Follow-Up: At [facility], you and your health needs are our priority.  As part of our continuing mission to provide you with exceptional heart care, we have created designated Provider Care Teams.  These Care Teams include your primary Cardiologist (physician) and Advanced Practice Providers (APPs -  Physician Assistants and Nurse Practitioners) who all work together to provide you with the care you need, when you need it.  We recommend signing up for the patient portal called "MyChart".  Sign up information is provided on this After Visit Summary.  MyChart is used to connect with patients for Virtual Visits (Telemedicine).  Patients are able to view lab/test results, encounter notes, upcoming appointments, etc.  Non-urgent messages can be sent to your provider as well.   To learn more about what you can do with MyChart, go to [email or web address].    Your next appointment:    12 months with Dr. Hilty  

## 2023-02-24 NOTE — Progress Notes (Signed)
OFFICE NOTE  Chief Complaint:  Follow-up  Primary Care Physician: Melida Quitter, PA  HPI:  Vickie Bowman is a 72 y.o. female with a past medial history significant for hospitalization in September for acute dyspnea.  She was admitted for COPD exacerbation was found to have elevated troponin of 1.55.  Cardiology was consulted and echo showed an EF 35 to 40%.  I recommended heart catheterization for her.  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  LVEF at the time was up to 50 to 55%.  Was subsequently seen by Azalee Course, PA-C, and follow-up and a limited echo was ordered.  She was noted to be on atorvastatin 40 mg daily.  A fasting lipid profile LFTs were also ordered.  Recently the labs resulted and she did have marked improvement in her lipid profile with a total cholesterol 143 and LDL of 52, decreased from 161.  Unfortunately, she had an acute rise in AST and ALT to 97 and 185 respectively from 32 and 25.  This suggest there may be underlying steatohepatitis.  She remains asymptomatic with respect to that.  She is followed in pulmonary has an upcoming appointment in 2 weeks.  She is noted to be on oxygen.  Otherwise besides shortness of breath denies any chest pain.  06/17/2019  Vickie Bowman returns today for follow-up.  Her main issues continue to be with COPD.  She is on oxygen but has been off of cigarettes for about 2 years.  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  I advised long-term beta-blocker as well as low-dose aspirin.  She did have some coronary disease and was on atorvastatin however had elevated liver enzymes.  Based on that it was discontinued.  Since then her cholesterol apparently is gone up however I do not have access to those records at this time.  We will request those from her primary care provider.  07/02/2019  Vickie Bowman is seen today in follow-up.  Unfortunately she had  COVID-19 in November 2021.  Since then she says she has been a little more fatigued and short of breath.  She says she tires real easily as well.  She is on continuous oxygen.  She uses a finger oximeter at home and her oxygen levels have been over 90%.  She is also been having some leg pains and achiness bilaterally.  She is not sure what this could be related to however I offered it might be due to her statin.  She had previously taken atorvastatin but had issues with liver enzyme elevation.  Rosuvastatin is not cause that issue but it could be causing her pain.  We talked about a 2-week statin holiday to see if this resolves.  As mentioned above her last echo in 2020 showed normalization of her LVEF however she has a history of cardiomyopathy in the past.  11/19/2021  Vickie Bowman returns today for follow-up.  Overall she is doing well although continues to have shortness of breath on oxygen with her COPD.  She has maintained her weight fairly well but remains in the low end of normal.  She is due for repeat lipids.  She had called in saying that she was having issues with leg pain.  We advised her to stop her statin and contact us after 2 weeks to see if her symptoms had resolved.  Apparently they did not change but she never went back on  her medicine.  She was on statin 20 mg daily.  Blood pressure is well controlled today.  She denies any chest pain or worsening shortness of breath.  02/24/2023  Vickie Bowman is seen today in follow-up.  She seems to be doing well.  She was seen by one of our nurse practitioners recently.  She had noted 2 episodes of atypical chest pain but has had no further ones.  She remains on oxygen with stable findings.  She denies any chest pain or worsening shortness of breath.  Blood pressure is well-controlled today.  She had lipid testing in September.  Total cholesterol 142, HDL 59, triglycerides 96 and LDL 65 after I had recommended her going back on rosuvastatin.  She has had  abnormal liver enzymes in the past however her ALT was normal.  The only other abnormal finding was a mildly elevated TSH at 6.3.  Thyroid hormones were not checked.  She is scheduled to have a new patient visit with Dr. Veto Kemps in October.  She is asking about restarting pain medications today specifically narcotics which she has used in the past very sparingly for back pain.  PMHx:  Past Medical History:  Diagnosis Date   Anxiety    CHF (congestive heart failure) (HCC)    COPD (chronic obstructive pulmonary disease) (HCC)    Lumbar herniated disc    Tobacco abuse     Past Surgical History:  Procedure Laterality Date   LEFT HEART CATH AND CORONARY ANGIOGRAPHY N/A 02/05/2018   Procedure: LEFT HEART CATH AND CORONARY ANGIOGRAPHY;  Surgeon: Yvonne Kendall, MD;  Location: [facility];  Service: Cardiovascular;  Laterality: N/A;    FAMHx:  Family History  Problem Relation Age of Onset   Liver cancer Mother    Breast cancer Sister    CAD Neg Hx     SOCHx:   reports that she quit smoking about 5 years ago. Her smoking use included cigarettes. She started smoking about 55 years ago. She has a 79.5 pack-year smoking history. She has never used smokeless tobacco. She reports current alcohol use. She reports that she does not use drugs.  ALLERGIES:  Allergies  Allergen Reactions   Penicillins Swelling    Has patient had a PCN reaction causing immediate rash, facial/tongue/throat swelling, SOB or lightheadedness with hypotension: Yes Has patient had a PCN reaction causing severe rash involving mucus membranes or skin necrosis: No Has patient had a PCN reaction that required hospitalization: No Has patient had a PCN reaction occurring within the last 10 years: No If all of the above answers are "NO", then may proceed with Cephalosporin use.     ROS: Pertinent items noted in HPI and remainder of comprehensive ROS otherwise negative.  HOME MEDS: Current Outpatient Medications on  File Prior to Visit  Medication Sig Dispense Refill   albuterol (VENTOLIN HFA) 108 (90 Base) MCG/ACT inhaler Inhale 2 puffs into the lungs every 6 (six) hours as needed for wheezing or shortness of breath. 8 g 2   ALPRAZolam (XANAX) 1 MG tablet Take 1 tablet (1 mg total) by mouth 2 (two) times daily. 60 tablet 2   aspirin EC 81 MG EC tablet Take 1 tablet (81 mg total) by mouth daily. 30 tablet 0   BREZTRI AEROSPHERE 160-9-4.8 MCG/ACT AERO Inhale 2 puffs into the lungs in the morning and at bedtime. 11 g 0   Cholecalciferol (VITAMIN D) 2000 units CAPS Take 1 capsule (2,000 Units total) by mouth daily. 30 capsule  5   ciclopirox (PENLAC) 8 % solution Apply topically at bedtime. Apply over nail and surrounding skin. Apply daily over previous coat. Remove weekly with polish remover. 6.6 mL 11   DULoxetine (CYMBALTA) 30 MG capsule Take 1 capsule by mouth once daily 30 capsule 0   DULoxetine (CYMBALTA) 60 MG capsule Take 1 capsule (60 mg total) by mouth daily. 30 capsule 0   ipratropium-albuterol (DUONEB) 0.5-2.5 (3) MG/3ML SOLN Take 3 mLs by nebulization every 6 (six) hours as needed. 90 mL 3   ketoconazole (NIZORAL) 2 % cream Apply 1 Application topically daily. 60 g 2   meloxicam (MOBIC) 7.5 MG tablet Take 1 tablet (7.5 mg total) by mouth daily. 90 tablet 1   metoprolol succinate (TOPROL-XL) 25 MG 24 hr tablet Take 0.5 tablets (12.5 mg total) by mouth daily. 90 tablet 1   pregabalin (LYRICA) 25 MG capsule Take 1 capsule (25 mg total) by mouth 3 (three) times daily. 30 capsule 5   rosuvastatin (CRESTOR) 20 MG tablet Take 1 tablet by mouth once daily 90 tablet 0   Spacer/Aero-Holding Chambers (AEROCHAMBER MV) inhaler Use as instructed 1 each 0   acetaminophen (TYLENOL) 650 MG CR tablet Take 1 tablet (650 mg total) by mouth every 8 (eight) hours as needed for pain. (Patient not taking: Reported on 02/24/2023) 90 tablet 1   predniSONE (STERAPRED UNI-PAK 21 TAB) 10 MG (21) TBPK tablet 6 day taper - take by  mouth as directed for 6 days (Patient not taking: Reported on 02/24/2023) 21 tablet 0   No current facility-administered medications on file prior to visit.    LABS/IMAGING: No results found for this or any previous visit (from the past 48 hour(s)). No results found.  LIPID PANEL:    Component Value Date/Time   CHOL 142 02/15/2023 1015   TRIG 96 02/15/2023 1015   HDL 59 02/15/2023 1015   CHOLHDL 2.4 02/15/2023 1015   CHOLHDL 3.0 02/02/2018 0624   VLDL 9 02/02/2018 0624   LDLCALC 65 02/15/2023 1015     WEIGHTS: Wt Readings from Last 3 Encounters:  02/24/23 143 lb (64.9 kg)  01/26/23 144 lb 12.8 oz (65.7 kg)  11/09/22 137 lb (62.1 kg)    VITALS: BP 114/84   Pulse 74   Ht 5\' 8"  (1.727 m)   Wt 143 lb (64.9 kg)   SpO2 98%   BMI 21.74 kg/m   EXAM: General appearance: alert, no distress and On oxygen Neck: no carotid bruit, no JVD and thyroid not enlarged, symmetric, no tenderness/mass/nodules Lungs: diminished breath sounds bilaterally and rhonchi bilaterally Heart: regular rate and rhythm, S1, S2 normal, no murmur, click, rub or gallop Abdomen: soft, non-tender; bowel sounds normal; no masses,  no organomegaly and Scaphoid Extremities: extremities normal, atraumatic, no cyanosis or edema Pulses: 2+ and symmetric Skin: Skin color, texture, turgor normal. No rashes or lesions Neurologic: Grossly normal Psych: Pleasant  EKG: EKG Interpretation Date/Time:  Friday February 24 2023 15:01:38 EDT Ventricular Rate:  74 PR Interval:  156 QRS Duration:  86 QT Interval:  394 QTC Calculation: 437 R Axis:   64  Text Interpretation: Normal sinus rhythm Normal ECG When compared with ECG of 05-Mar-2018 02:13, T wave inversion no longer evident in Inferior leads Nonspecific T wave abnormality no longer evident in Lateral leads Confirmed by Zoila Shutter [phone]) on 02/24/2023 3:18:42 PM    ASSESSMENT: COPD on oxygen  History of COVID-19 infection (03/2020) History of acute  systolic congestive heart failure, LVEF 35 to  40%-improved to 55 to 60% (05/2018) Mild nonobstructive coronary disease (01/2018) Elevated LFTs on statin - now resolved Dyslipidemia, goal LDL less than 70  PLAN: 1.   Mrs. Boyden has restarted her statin therapy and her liver enzymes are now normal.  Cholesterol is much better controlled with LDL at target less than 70.  Her main issues have to do with COPD and remains on oxygen for this.  LVEF had normalized back in 2020 and there is no indication of any recurrent heart failure.  Blood pressure is controlled.  No changes to her medicines today.  Plan follow-up with me annually or sooner as necessary.  Chrystie Nose, MD, [facility], FACP  [place]  [facility]  Medical Director of the [facility] Lipid [facility] &  Cardiovascular Risk Reduction Clinic Diplomate of the American Board of Clinical Lipidology Attending Cardiologist  Direct Dial: [phone]  Fax: [phone]  Website:  [email or web address]   Lisette Abu Kenslee Achorn 02/24/2023, 3:19 PM

## 2023-03-09 ENCOUNTER — Telehealth: Payer: Self-pay

## 2023-03-09 DIAGNOSIS — G8929 Other chronic pain: Secondary | ICD-10-CM

## 2023-03-09 NOTE — Telephone Encounter (Signed)
Is she still taking the Tylenol?  It looks like at her cardiology appointment she reported that she was no longer taking it.  She can take the prescription strength Tylenol up to 4 times a day if she needs to.  As we discussed at her appointment, I am hesitant for her to be taking hydrocodone at the same time that she is taking Xanax and pregabalin daily because of the interactions that increased risk of falls, delirium, respiratory depression.  If she is taking the Tylenol 4 times a day and the pain is still not controlled, then we can discuss restarting the hydrocodone-acetaminophen while still working to cut back on its use as well as the use of Xanax to reduce the risk of adverse events.

## 2023-03-09 NOTE — Telephone Encounter (Signed)
Pt is requesting to get back on Hydrocodone bc the tylenol is not working for her.

## 2023-03-10 MED ORDER — ACETAMINOPHEN ER 650 MG PO TBCR: 650.0000 mg | EXTENDED_RELEASE_TABLET | Freq: Three times a day (TID) | ORAL | 1 refills | Status: DC

## 2023-03-10 NOTE — Telephone Encounter (Signed)
Prescription resent to [facility] with instructions to start scheduled use of acetaminophen 650 mg every 8 hours.  Also has the option to use as needed for pain.  If ineffective when using PRN, suggest starting scheduled use.

## 2023-03-10 NOTE — Telephone Encounter (Signed)
Patient states that she has not been taking any form of Tylenol and takes Hydrocodone as needed. I explained to patient the possible side of effects from medication interactions between Hydrocodone, Xanax and Pregabalin. Patient is willing to take prescription Tylenol to reduce risks. She request a prescription for Tylenol be sent to [facility].

## 2023-03-10 NOTE — Addendum Note (Signed)
Addended by: Saralyn Pilar on: 03/10/2023 12:00 PM   Modules accepted: Orders

## 2023-03-14 ENCOUNTER — Ambulatory Visit: Payer: 59 | Admitting: Family Medicine

## 2023-03-23 ENCOUNTER — Other Ambulatory Visit: Payer: Self-pay | Admitting: Family Medicine

## 2023-03-23 DIAGNOSIS — J449 Chronic obstructive pulmonary disease, unspecified: Secondary | ICD-10-CM

## 2023-03-26 ENCOUNTER — Other Ambulatory Visit: Payer: Self-pay | Admitting: Neurology

## 2023-03-27 ENCOUNTER — Other Ambulatory Visit: Payer: Self-pay | Admitting: Pulmonary Disease

## 2023-03-30 ENCOUNTER — Telehealth: Payer: Self-pay | Admitting: Pulmonary Disease

## 2023-03-30 DIAGNOSIS — J449 Chronic obstructive pulmonary disease, unspecified: Secondary | ICD-10-CM

## 2023-03-30 NOTE — Telephone Encounter (Signed)
Patient needs refill of nebulizer solution.   [facility] on [address]      Patient saw on TV that Dupixent helps with COPD and was wondering if that was something she could take. Please call and advise [phone]

## 2023-04-03 NOTE — Telephone Encounter (Signed)
Patient checking on message for Nebulizer solution and Dupixent, Patient phone number is [phone].

## 2023-04-03 NOTE — Telephone Encounter (Signed)
Patient states [facility] needs diagnosis code for Nebulizer solution. Also checking on Dupixent. Patient phone number is [phone].

## 2023-04-06 MED ORDER — ALBUTEROL SULFATE (2.5 MG/3ML) 0.083% IN NEBU: 2.5000 mg | INHALATION_SOLUTION | Freq: Four times a day (QID) | RESPIRATORY_TRACT | 0 refills | Status: DC | PRN

## 2023-04-06 NOTE — Telephone Encounter (Signed)
Albuterol sent with J44.9 and J96.11 Patient advised this would be something that she would discuss at next office visit in reference to Dupixent. NFN

## 2023-04-10 ENCOUNTER — Ambulatory Visit (INDEPENDENT_AMBULATORY_CARE_PROVIDER_SITE_OTHER): Payer: 59 | Admitting: Podiatry

## 2023-04-10 ENCOUNTER — Encounter: Payer: Self-pay | Admitting: Podiatry

## 2023-04-10 VITALS — Ht 68.0 in | Wt 143.0 lb

## 2023-04-10 DIAGNOSIS — M79675 Pain in left toe(s): Secondary | ICD-10-CM

## 2023-04-10 DIAGNOSIS — B351 Tinea unguium: Secondary | ICD-10-CM | POA: Diagnosis not present

## 2023-04-10 DIAGNOSIS — M79674 Pain in right toe(s): Secondary | ICD-10-CM

## 2023-04-10 NOTE — Progress Notes (Unsigned)
       Subjective:  Patient ID: Vickie Bowman, female    DOB: 03-18-1951,  MRN: [id number]   Vickie Bowman presents to clinic today for:  Chief Complaint  Patient presents with   Nail Problem    RFC  . Patient notes nails are thick, discolored, elongated and painful in shoegear when trying to ambulate.  Notes she wants her nails "cut short, but cut straight".  PCP is Melida Quitter, PA.  Past Medical History:  Diagnosis Date   Anxiety    CHF (congestive heart failure) (HCC)    COPD (chronic obstructive pulmonary disease) (HCC)    Lumbar herniated disc    Tobacco abuse     Past Surgical History:  Procedure Laterality Date   LEFT HEART CATH AND CORONARY ANGIOGRAPHY N/A 02/05/2018   Procedure: LEFT HEART CATH AND CORONARY ANGIOGRAPHY;  Surgeon: Yvonne Kendall, MD;  Location: [facility];  Service: Cardiovascular;  Laterality: N/A;    Allergies  Allergen Reactions   Penicillins Swelling    Has patient had a PCN reaction causing immediate rash, facial/tongue/throat swelling, SOB or lightheadedness with hypotension: Yes Has patient had a PCN reaction causing severe rash involving mucus membranes or skin necrosis: No Has patient had a PCN reaction that required hospitalization: No Has patient had a PCN reaction occurring within the last 10 years: No If all of the above answers are "NO", then may proceed with Cephalosporin use.     Review of Systems: Negative except as noted in the HPI.  Objective:  Vickie Bowman is a pleasant 72 y.o. female in NAD. AAO x 3.  Vascular Examination: Capillary refill time is 3-5 seconds to toes bilateral. Palpable pedal pulses b/l LE. Digital hair present b/l.  Skin temperature gradient WNL b/l. No varicosities b/l. No cyanosis noted b/l.   Dermatological Examination: Pedal skin with normal turgor, texture and tone b/l. No open wounds. No interdigital macerations b/l. Toenails x10 are 3mm thick, discolored, dystrophic with  subungual debris. There is pain with compression of the nail plates.  They are elongated x10     Latest Ref Rng & Units 02/15/2023   10:15 AM  Hemoglobin A1C  Hemoglobin-A1c 4.8 - 5.6 % 5.8    Assessment/Plan: 1. Pain due to onychomycosis of toenails of both feet    The mycotic toenails were sharply debrided x10 with sterile nail nippers and a power debriding burr to decrease bulk/thickness and length.    Return in about 11 weeks (around 06/26/2023) for RFC.   Clerance Lav, DPM, FACFAS [facility]     [address] [place], [place] [address]                Office [phone]  Fax [phone]

## 2023-04-15 ENCOUNTER — Other Ambulatory Visit (HOSPITAL_BASED_OUTPATIENT_CLINIC_OR_DEPARTMENT_OTHER): Payer: Self-pay | Admitting: Internal Medicine

## 2023-04-23 ENCOUNTER — Other Ambulatory Visit: Payer: Self-pay | Admitting: Neurology

## 2023-04-27 ENCOUNTER — Encounter: Payer: Self-pay | Admitting: Family Medicine

## 2023-04-27 ENCOUNTER — Ambulatory Visit (INDEPENDENT_AMBULATORY_CARE_PROVIDER_SITE_OTHER): Payer: 59 | Admitting: Family Medicine

## 2023-04-27 VITALS — BP 123/75 | HR 93 | Resp 20 | Ht 68.0 in | Wt 145.0 lb

## 2023-04-27 DIAGNOSIS — M5442 Lumbago with sciatica, left side: Secondary | ICD-10-CM | POA: Diagnosis not present

## 2023-04-27 DIAGNOSIS — G8929 Other chronic pain: Secondary | ICD-10-CM

## 2023-04-27 DIAGNOSIS — F410 Panic disorder [episodic paroxysmal anxiety] without agoraphobia: Secondary | ICD-10-CM | POA: Diagnosis not present

## 2023-04-27 DIAGNOSIS — M5441 Lumbago with sciatica, right side: Secondary | ICD-10-CM | POA: Diagnosis not present

## 2023-04-27 DIAGNOSIS — F411 Generalized anxiety disorder: Secondary | ICD-10-CM

## 2023-04-27 DIAGNOSIS — G6181 Chronic inflammatory demyelinating polyneuritis: Secondary | ICD-10-CM | POA: Diagnosis not present

## 2023-04-27 DIAGNOSIS — M19041 Primary osteoarthritis, right hand: Secondary | ICD-10-CM | POA: Insufficient documentation

## 2023-04-27 DIAGNOSIS — M19042 Primary osteoarthritis, left hand: Secondary | ICD-10-CM | POA: Insufficient documentation

## 2023-04-27 MED ORDER — ALPRAZOLAM 0.5 MG PO TABS: 0.7500 mg | ORAL_TABLET | Freq: Two times a day (BID) | ORAL | 0 refills | Status: DC | PRN

## 2023-04-27 MED ORDER — HYDROCODONE-ACETAMINOPHEN 5-325 MG PO TABS: 1.0000 | ORAL_TABLET | Freq: Two times a day (BID) | ORAL | 0 refills | Status: DC | PRN

## 2023-04-27 MED ORDER — DICLOFENAC SODIUM 1 % EX GEL: 2.0000 g | Freq: Four times a day (QID) | CUTANEOUS | 11 refills | Status: AC

## 2023-04-27 NOTE — Assessment & Plan Note (Signed)
PHQ-9 and GAD-7 scores of 0.  Stable.  Working towards tapering off of Xanax, start by decreasing dose to 0.75 mg twice daily.  Hoping that routine use of increased dose of Cymbalta 90 mg daily and pregabalin will improve anxiety and reduce need for Xanax.  Patient verbalized understanding and is agreeable to this plan.

## 2023-04-27 NOTE — Assessment & Plan Note (Signed)
Trial of diclofenac topical gel.  Will continue to monitor.

## 2023-04-27 NOTE — Patient Instructions (Signed)
I have sent your hydrocodone-acetaminophen to the pharmacy for you to pick up if you need it for severe back pain.  As we discussed, we will try a slightly lower dose of Xanax twice a day and see how you do!  Try the cream that I sent in for you on the joints in your hands that bother you the most.

## 2023-04-27 NOTE — Progress Notes (Signed)
Established Patient Office Visit  Subjective   Patient ID: Vickie Bowman, female    DOB: 1951/04/05  Age: 72 y.o. MRN: [id number]  Chief Complaint  Patient presents with   Anxiety    HPI Vickie Bowman is a 72 y.o. female presenting today for follow up of chronic back pain, mood.  She also complains of bothersome arthritis in both hands particularly her thumbs. Back pain: Continues to follow with neurology, they are managing prescriptions for Cymbalta 90 mg daily and pregabalin.  At last appointment endorsed using hydrocodone-acetaminophen for severe back pain 2-3 times each week.  Recommended using acetaminophen 650 mg every 8 hours as needed to start, this has been ineffective even when taking scheduled throughout the day Mood: Patient is here to follow up for anxiety, currently managing with Cymbalta and also taking Xanax twice daily. Taking medication without side effects, reports excellent compliance with treatment. Denies mood changes or SI/HI. She feels mood is stable since last visit.  She does still find the need to take Xanax twice daily even with the higher dose of Cymbalta.  She is willing to work on decreasing the dose to reduce the risk associated with long-term use.      04/27/2023    1:19 PM 01/26/2023    2:28 PM 10/27/2022    1:12 PM  Depression screen PHQ 2/9  Decreased Interest 0 0 0  Down, Depressed, Hopeless 0 0 0  PHQ - 2 Score 0 0 0  Altered sleeping 0 0 0  Tired, decreased energy 0 0 0  Change in appetite 0 0 0  Feeling bad or failure about yourself  0 0 0  Trouble concentrating 0 0 0  Moving slowly or fidgety/restless 0 0 0  Suicidal thoughts 0 0 0  PHQ-9 Score 0 0 0  Difficult doing work/chores Not difficult at all Not difficult at all Not difficult at all       04/27/2023    1:19 PM 06/22/2022    2:56 PM 03/16/2022    1:37 PM 02/15/2022    2:18 PM  GAD 7 : Generalized Anxiety Score  Nervous, Anxious, on Edge 0 0 0 0  Control/stop worrying 0 0 0 0   Worry too much - different things 0 0 0 0  Trouble relaxing 0 0 0 0  Restless 0 0 0 0  Easily annoyed or irritable 0 0 0 0  Afraid - awful might happen 0 0 0 0  Total GAD 7 Score 0 0 0 0  Anxiety Difficulty Not difficult at all       Outpatient Medications Prior to Visit  Medication Sig   acetaminophen (TYLENOL) 650 MG CR tablet Take 1 tablet (650 mg total) by mouth every 8 (eight) hours. May take scheduled OR as needed. If pain not controlled by as needed use, start scheduled use twice daily, increase to three times daily if needed.   albuterol (PROVENTIL) (2.5 MG/3ML) 0.083% nebulizer solution Take 3 mLs (2.5 mg total) by nebulization every 6 (six) hours as needed for wheezing or shortness of breath. J44.9 J96.11   albuterol (VENTOLIN HFA) 108 (90 Base) MCG/ACT inhaler Inhale 2 puffs into the lungs every 6 (six) hours as needed for wheezing or shortness of breath.   aspirin EC 81 MG EC tablet Take 1 tablet (81 mg total) by mouth daily.   BREZTRI AEROSPHERE 160-9-4.8 MCG/ACT AERO INHALE 2 PUFFS IN THE MORNING AND AT BEDTIME   Cholecalciferol (VITAMIN D) 2000  units CAPS Take 1 capsule (2,000 Units total) by mouth daily.   ciclopirox (PENLAC) 8 % solution Apply topically at bedtime. Apply over nail and surrounding skin. Apply daily over previous coat. Remove weekly with polish remover.   DULoxetine (CYMBALTA) 30 MG capsule Take 1 capsule by mouth once daily   DULoxetine (CYMBALTA) 60 MG capsule Take 1 capsule (60 mg total) by mouth daily.   ipratropium-albuterol (DUONEB) 0.5-2.5 (3) MG/3ML SOLN Take 3 mLs by nebulization every 6 (six) hours as needed.   ketoconazole (NIZORAL) 2 % cream Apply 1 Application topically daily.   meloxicam (MOBIC) 7.5 MG tablet Take 1 tablet (7.5 mg total) by mouth daily.   metoprolol succinate (TOPROL-XL) 25 MG 24 hr tablet Take 0.5 tablets (12.5 mg total) by mouth daily.   predniSONE (STERAPRED UNI-PAK 21 TAB) 10 MG (21) TBPK tablet 6 day taper - take by mouth  as directed for 6 days   pregabalin (LYRICA) 25 MG capsule Take 1 capsule (25 mg total) by mouth 3 (three) times daily.   rosuvastatin (CRESTOR) 20 MG tablet Take 1 tablet by mouth once daily   Spacer/Aero-Holding Chambers (AEROCHAMBER MV) inhaler Use as instructed   [DISCONTINUED] ALPRAZolam (XANAX) 1 MG tablet Take 1 tablet (1 mg total) by mouth 2 (two) times daily.   No facility-administered medications prior to visit.    ROS Negative unless otherwise noted in HPI   Objective:     BP 123/75 (BP Location: Left Arm, Patient Position: Sitting, Cuff Size: Normal)   Pulse 93   Resp 20   Ht 5\' 8"  (1.727 m)   Wt 145 lb (65.8 kg)   SpO2 95%   BMI 22.05 kg/m   Physical Exam Constitutional:      General: She is not in acute distress.    Appearance: Normal appearance.  HENT:     Head: Normocephalic and atraumatic.  Cardiovascular:     Rate and Rhythm: Normal rate and regular rhythm.     Heart sounds: No murmur heard.    No friction rub. No gallop.  Pulmonary:     Effort: Pulmonary effort is normal. No respiratory distress.     Breath sounds: Rhonchi present. No wheezing or rales.  Skin:    General: Skin is warm and dry.  Neurological:     Mental Status: She is alert and oriented to person, place, and time.     Assessment & Plan:  Generalized anxiety disorder Assessment & Plan: PHQ-9 and GAD-7 scores of 0.  Stable.  Working towards tapering off of Xanax, start by decreasing dose to 0.75 mg twice daily.  Hoping that routine use of increased dose of Cymbalta 90 mg daily and pregabalin will improve anxiety and reduce need for Xanax.  Patient verbalized understanding and is agreeable to this plan.  Orders: -     ALPRAZolam; Take 1.5 tablets (0.75 mg total) by mouth 2 (two) times daily as needed for anxiety.  Dispense: 90 tablet; Refill: 0  Panic attacks -     ALPRAZolam; Take 1.5 tablets (0.75 mg total) by mouth 2 (two) times daily as needed for anxiety.  Dispense: 90 tablet;  Refill: 0  Chronic midline low back pain with bilateral sciatica Assessment & Plan: We discussed that taking hydrocodone increases her risk of falls, delirium, and has potential interactions with Xanax and pregabalin.  Acetaminophen alone ineffective, refilling hydrocodone-acetaminophen 5-325 mg as previously for breakthrough severe pain.  PDMP reviewed, no aberrancies.  Overdose risk score 020.  Will continue  to monitor.  Orders: -     HYDROcodone-Acetaminophen; Take 1 tablet by mouth 2 (two) times daily as needed for severe pain (pain score 7-10).  Dispense: 30 tablet; Refill: 0  Chronic inflammatory demyelinating polyneuropathy (HCC) -     HYDROcodone-Acetaminophen; Take 1 tablet by mouth 2 (two) times daily as needed for severe pain (pain score 7-10).  Dispense: 30 tablet; Refill: 0  Primary osteoarthritis of both hands Assessment & Plan: Trial of diclofenac topical gel.  Will continue to monitor.  Orders: -     Diclofenac Sodium; Apply 2 g topically 4 (four) times daily. To affected joint.  Dispense: 100 g; Refill: 11    Return in about 3 months (around 07/26/2023) for follow-up for back pain management, mood.    Melida Quitter, PA

## 2023-04-27 NOTE — Assessment & Plan Note (Signed)
We discussed that taking hydrocodone increases her risk of falls, delirium, and has potential interactions with Xanax and pregabalin.  Acetaminophen alone ineffective, refilling hydrocodone-acetaminophen 5-325 mg as previously for breakthrough severe pain.  PDMP reviewed, no aberrancies.  Overdose risk score 020.  Will continue to monitor.

## 2023-05-08 DIAGNOSIS — J441 Chronic obstructive pulmonary disease with (acute) exacerbation: Secondary | ICD-10-CM | POA: Diagnosis not present

## 2023-05-21 ENCOUNTER — Other Ambulatory Visit: Payer: Self-pay | Admitting: Neurology

## 2023-05-26 ENCOUNTER — Other Ambulatory Visit: Payer: Self-pay | Admitting: Family Medicine

## 2023-05-26 DIAGNOSIS — J449 Chronic obstructive pulmonary disease, unspecified: Secondary | ICD-10-CM

## 2023-05-29 ENCOUNTER — Other Ambulatory Visit: Payer: Self-pay | Admitting: Family Medicine

## 2023-05-29 DIAGNOSIS — J449 Chronic obstructive pulmonary disease, unspecified: Secondary | ICD-10-CM

## 2023-05-29 MED ORDER — ALBUTEROL SULFATE (2.5 MG/3ML) 0.083% IN NEBU: 2.5000 mg | INHALATION_SOLUTION | Freq: Four times a day (QID) | RESPIRATORY_TRACT | 0 refills | Status: DC | PRN

## 2023-05-29 NOTE — Telephone Encounter (Signed)
 Copied from CRM [id number]. Topic: Clinical - Medication Refill >> May 29, 2023  4:47 PM Chase BROCKS wrote: Most Recent Primary Care Visit:  Provider: WALLACE SEARCH A  Department: [facility]  Visit Type: OFFICE VISIT 20  Date: 04/27/2023  Medication: albuterol  (VENTOLIN  HFA) 108 (90 Base) MCG/ACT inhaler  Has the patient contacted their pharmacy? Yes (Agent: If no, request that the patient contact the pharmacy for the refill. If patient does not wish to contact the pharmacy document the reason why and proceed with request.) (Agent: If yes, when and what did the pharmacy advise?)  Is this the correct pharmacy for this prescription? Yes If no, delete pharmacy and type the correct one.  This is the patient's preferred pharmacy:  [facility] [address] [place], [place] - [address] [place] [address] Phone: [phone] Fax: [phone]    Has the prescription been filled recently? Yes  Is the patient out of the medication? Yes  Has the patient been seen for an appointment in the last year OR does the patient have an upcoming appointment? Yes  Can we respond through MyChart? Yes  Agent: Please be advised that Rx refills may take up to 3 business days. We ask that you follow-up with your pharmacy.

## 2023-05-30 ENCOUNTER — Other Ambulatory Visit: Payer: Self-pay | Admitting: Family Medicine

## 2023-05-30 DIAGNOSIS — F411 Generalized anxiety disorder: Secondary | ICD-10-CM

## 2023-05-30 DIAGNOSIS — F410 Panic disorder [episodic paroxysmal anxiety] without agoraphobia: Secondary | ICD-10-CM

## 2023-05-31 ENCOUNTER — Encounter: Payer: Self-pay | Admitting: Neurology

## 2023-05-31 ENCOUNTER — Ambulatory Visit: Payer: 59 | Admitting: Neurology

## 2023-05-31 DIAGNOSIS — G6289 Other specified polyneuropathies: Secondary | ICD-10-CM

## 2023-05-31 MED ORDER — DULOXETINE HCL 30 MG PO CPEP: 30.0000 mg | ORAL_CAPSULE | Freq: Every day | ORAL | 3 refills | Status: DC

## 2023-05-31 MED ORDER — PREGABALIN 25 MG PO CAPS: 25.0000 mg | ORAL_CAPSULE | Freq: Two times a day (BID) | ORAL | 5 refills | Status: DC

## 2023-05-31 MED ORDER — DULOXETINE HCL 60 MG PO CPEP: 60.0000 mg | ORAL_CAPSULE | Freq: Every day | ORAL | 3 refills | Status: DC

## 2023-05-31 NOTE — Patient Instructions (Addendum)
 Continue with duloxetine 90 mg daily Increase pregabalin 25 mg twice daily Continue other medication Follow-up in 1 year or sooner if worse.

## 2023-05-31 NOTE — Progress Notes (Signed)
 [facility]  PATIENT: Vickie Bowman DOB: 11/22/1950  REQUESTING CLINICIAN: Hanford Powell BRAVO, NP HISTORY FROM: Patient  REASON FOR VISIT: Bilateral feet and hands numbness    HISTORICAL  CHIEF COMPLAINT:  Chief Complaint  Patient presents with   Polyneuropathy    Rm 13 alone Pt is well and stable, reports Lyrica  has improved her symptoms. No new concerns.    INTERVAL HISTORY 05/31/2023: Patient presents today for follow-up, she is alone.  Last visit was in June, at that time we added pregabalin  to her duloxetine  90 mg daily for neuropathy.  She tells me that the Pregabalin  has helped her tremendously.  Her pain is now better controlled.  Currently she does not have any additional questions or concern in terms of the neuropathy.   INTERVAL HISTORY 11/09/2022:  Patient presents today for follow-up, she is alone.  At last visit in December we had increased her duloxetine  to 90 mg daily.  She reports the increasing in duloxetine  has helped her neuropathy but she still experiencing some pain. Other than that she reports she is doing well, at time she will try the Aspercreme.  She has recently follow-up with the pulmonology and cardiology and they told her that everything is stable.  No other complaints.   INTERVAL HISTORY 05/03/2022: Patient presents today for follow-up, at last visit we tried to prescribe pregabalin  but she did not tolerate the side effect of being drowsy.  We also started her on a lidocaine  patch (ZTLido ) but she reports that it was not helpful.  She feels that the duloxetine  has been helpful to decrease her neuropathic pain and would like to increase on the medication.  Otherwise she is doing well no other complaints.   INTERVAL HISTORY 10/26/21:  Patient presents today for follow-up, last visit was in December.  At that time plan was to start duloxetine  60 mg daily.  She is compliant with the medication, states that it helps with the neuropathy but  she still feels the pain mostly at night.  She has not tried any additional medication for it.  Again pain is described as tingling and burning with constant numbness in both feet and hands.  Sample given to patient Ztlido    HISTORY OF PRESENT ILLNESS:  This is a 73 year old woman with past medical history of anxiety and panic attack, COPD on additional oxygen  who is presenting with numbness in both feet and hands.  Patient stated numbness started in the past 32-month, she notices more when going to bed.  Denies any pain in the lower extremities, denies any back pain and denies any shooting pain.  She reported trying gabapentin in the past without benefit. Currently she is not on medication for numbness. Denies any history of diabetes.  Denies any falls.     OTHER MEDICAL CONDITIONS: Anxiety, COPD, Panic attack   REVIEW OF SYSTEMS: Full 14 system review of systems performed and negative with exception of: as noted in the HPI   ALLERGIES: Allergies  Allergen Reactions   Penicillins Swelling    Has patient had a PCN reaction causing immediate rash, facial/tongue/throat swelling, SOB or lightheadedness with hypotension: Yes Has patient had a PCN reaction causing severe rash involving mucus membranes or skin necrosis: No Has patient had a PCN reaction that required hospitalization: No Has patient had a PCN reaction occurring within the last 10 years: No If all of the above answers are NO, then may proceed with Cephalosporin use.     HOME MEDICATIONS:  Outpatient Medications Prior to Visit  Medication Sig Dispense Refill   albuterol  (PROVENTIL ) (2.5 MG/3ML) 0.083% nebulizer solution Take 3 mLs (2.5 mg total) by nebulization every 6 (six) hours as needed for wheezing or shortness of breath. J44.9 J96.11 90 mL 0   ALPRAZolam  (XANAX ) 0.5 MG tablet Take 1.5 tablets (0.75 mg total) by mouth 2 (two) times daily as needed for anxiety. 90 tablet 0   aspirin  EC 81 MG EC tablet Take 1 tablet (81 mg  total) by mouth daily. 30 tablet 0   BREZTRI  AEROSPHERE 160-9-4.8 MCG/ACT AERO INHALE 2 PUFFS BY MOUTH INTO THE LUNGS IN THE MORNING AND AT BEDTIME 11 g 3   Cholecalciferol (VITAMIN D ) 2000 units CAPS Take 1 capsule (2,000 Units total) by mouth daily. 30 capsule 5   diclofenac  Sodium (VOLTAREN ) 1 % GEL Apply 2 g topically 4 (four) times daily. To affected joint. 100 g 11   HYDROcodone -acetaminophen  (NORCO/VICODIN) 5-325 MG tablet Take 1 tablet by mouth 2 (two) times daily as needed for severe pain (pain score 7-10). 30 tablet 0   ipratropium-albuterol  (DUONEB) 0.5-2.5 (3) MG/3ML SOLN Take 3 mLs by nebulization every 6 (six) hours as needed. 90 mL 3   ketoconazole  (NIZORAL ) 2 % cream Apply 1 Application topically daily. 60 g 2   meloxicam  (MOBIC ) 7.5 MG tablet Take 1 tablet (7.5 mg total) by mouth daily. 90 tablet 1   metoprolol  succinate (TOPROL -XL) 25 MG 24 hr tablet Take 0.5 tablets (12.5 mg total) by mouth daily. 90 tablet 1   rosuvastatin  (CRESTOR ) 20 MG tablet Take 1 tablet by mouth once daily 90 tablet 3   Spacer/Aero-Holding Chambers (AEROCHAMBER MV) inhaler Use as instructed 1 each 0   DULoxetine  (CYMBALTA ) 30 MG capsule Take 1 capsule by mouth once daily 30 capsule 0   DULoxetine  (CYMBALTA ) 60 MG capsule Take 1 capsule (60 mg total) by mouth daily. 30 capsule 0   pregabalin  (LYRICA ) 25 MG capsule Take 1 capsule (25 mg total) by mouth 3 (three) times daily. 30 capsule 5   acetaminophen  (TYLENOL ) 650 MG CR tablet Take 1 tablet (650 mg total) by mouth every 8 (eight) hours. May take scheduled OR as needed. If pain not controlled by as needed use, start scheduled use twice daily, increase to three times daily if needed. (Patient not taking: Reported on 05/31/2023) 90 tablet 1   albuterol  (VENTOLIN  HFA) 108 (90 Base) MCG/ACT inhaler Inhale 2 puffs into the lungs every 6 (six) hours as needed for wheezing or shortness of breath. (Patient not taking: Reported on 05/31/2023) 8 g 2   ciclopirox  (PENLAC )  8 % solution Apply topically at bedtime. Apply over nail and surrounding skin. Apply daily over previous coat. Remove weekly with polish remover. (Patient not taking: Reported on 05/31/2023) 6.6 mL 11   predniSONE  (STERAPRED UNI-PAK 21 TAB) 10 MG (21) TBPK tablet 6 day taper - take by mouth as directed for 6 days (Patient not taking: Reported on 05/31/2023) 21 tablet 0   No facility-administered medications prior to visit.    PAST MEDICAL HISTORY: Past Medical History:  Diagnosis Date   Anxiety    CHF (congestive heart failure) (HCC)    COPD (chronic obstructive pulmonary disease) (HCC)    Lumbar herniated disc    Tobacco abuse     PAST SURGICAL HISTORY: Past Surgical History:  Procedure Laterality Date   LEFT HEART CATH AND CORONARY ANGIOGRAPHY N/A 02/05/2018   Procedure: LEFT HEART CATH AND CORONARY ANGIOGRAPHY;  Surgeon: Mady Bruckner,  MD;  Location: [facility];  Service: Cardiovascular;  Laterality: N/A;    FAMILY HISTORY: Family History  Problem Relation Age of Onset   Liver cancer Mother    Breast cancer Sister    CAD Neg Hx     SOCIAL HISTORY: Social History   Socioeconomic History   Marital status: Widowed    Spouse name: Not on file   Number of children: Not on file   Years of education: Not on file   Highest education level: Not on file  Occupational History   Not on file  Tobacco Use   Smoking status: Former    Current packs/day: 0.00    Average packs/day: 1.5 packs/day for 53.0 years (79.5 ttl pk-yrs)    Types: Cigarettes    Start date: 05/24/1967    Quit date: 01/07/2018    Years since quitting: 5.3    Passive exposure: Current   Smokeless tobacco: Never   Tobacco comments:    Smoked since age 37, quit 12/2017  Vaping Use   Vaping status: Never Used  Substance and Sexual Activity   Alcohol use: Yes    Comment: Rare, glass of wine no more than every few weeks   Drug use: Never   Sexual activity: Not Currently  Other Topics Concern   Not on  file  Social History Narrative   Right handed   Lives with brother   Caffeine 1 cup coffee   Social Drivers of Corporate Investment Banker Strain: Low Risk  (10/27/2022)   Overall Financial Resource Strain (CARDIA)    Difficulty of Paying Living Expenses: Not hard at all  Food Insecurity: No Food Insecurity (10/27/2022)   Hunger Vital Sign    Worried About Running Out of Food in the Last Year: Never true    Ran Out of Food in the Last Year: Never true  Transportation Needs: No Transportation Needs (10/27/2022)   PRAPARE - Administrator, Civil Service (Medical): No    Lack of Transportation (Non-Medical): No  Physical Activity: Insufficiently Active (10/27/2022)   Exercise Vital Sign    Days of Exercise per Week: 2 days    Minutes of Exercise per Session: 20 min  Stress: No Stress Concern Present (10/27/2022)   [employer] of Occupational Health - Occupational Stress Questionnaire    Feeling of Stress : Not at all  Social Connections: Socially Isolated (10/27/2022)   Social Connection and Isolation Panel [NHANES]    Frequency of Communication with Friends and Family: More than three times a week    Frequency of Social Gatherings with Friends and Family: More than three times a week    Attends Religious Services: Never    Database Administrator or Organizations: No    Attends Banker Meetings: Never    Marital Status: Widowed  Intimate Partner Violence: Not At Risk (10/27/2022)   Humiliation, Afraid, Rape, and Kick questionnaire    Fear of Current or Ex-Partner: No    Emotionally Abused: No    Physically Abused: No    Sexually Abused: No     PHYSICAL EXAM  GENERAL EXAM/CONSTITUTIONAL: Vitals:  Vitals:   05/31/23 1348  BP: 138/78  Pulse: 86  SpO2: 97%  Weight: 144 lb (65.3 kg)  Height: 5' 8 (1.727 m)     Body mass index is 21.9 kg/m. Wt Readings from Last 3 Encounters:  05/31/23 144 lb (65.3 kg)  04/27/23 145 lb (65.8 kg)  04/10/23 143 lb  (  64.9 kg)   Patient is in no distress; well developed, nourished and groomed; neck is supple. On O2 supplement   MUSCULOSKELETAL: Gait, strength, tone, movements noted in Neurologic exam below  NEUROLOGIC: MENTAL STATUS:      No data to display         awake, alert, oriented to person, place and time recent and remote memory intact normal attention and concentration language fluent, comprehension intact, naming intact fund of knowledge appropriate  CRANIAL NERVE:  2nd, 3rd, 4th, 6th -visual fields full to confrontation, extraocular muscles intact, no nystagmus 5th - facial sensation symmetric 7th - facial strength symmetric 8th - hearing intact 9th - palate elevates symmetrically, uvula midline 11th - shoulder shrug symmetric 12th - tongue protrusion midline  MOTOR:  normal bulk and tone, full strength in the BUE, BLE  SENSORY:  Decrease vibration up to ankle bilaterally   COORDINATION:  finger-nose-finger, fine finger movements normal  GAIT/STATION:  normal   DIAGNOSTIC DATA (LABS, IMAGING, TESTING) - I reviewed patient records, labs, notes, testing and imaging myself where available.  Lab Results  Component Value Date   WBC 6.4 02/15/2023   HGB 13.7 02/15/2023   HCT 42.3 02/15/2023   MCV 94 02/15/2023   PLT 165 02/15/2023      Component Value Date/Time   NA 144 02/15/2023 1015   K 4.3 02/15/2023 1015   CL 104 02/15/2023 1015   CO2 26 02/15/2023 1015   GLUCOSE 91 02/15/2023 1015   GLUCOSE 154 (H) 03/05/2018 0327   BUN 18 02/15/2023 1015   CREATININE 0.81 02/15/2023 1015   CALCIUM  9.2 02/15/2023 1015   PROT 6.2 02/15/2023 1015   ALBUMIN 4.2 02/15/2023 1015   AST 25 02/15/2023 1015   ALT 24 02/15/2023 1015   ALKPHOS 84 02/15/2023 1015   BILITOT 0.5 02/15/2023 1015   GFRNONAA 69 07/01/2020 1430   GFRAA 79 07/01/2020 1430   Lab Results  Component Value Date   CHOL 142 02/15/2023   HDL 59 02/15/2023   LDLCALC 65 02/15/2023   TRIG 96  02/15/2023   CHOLHDL 2.4 02/15/2023   Lab Results  Component Value Date   HGBA1C 5.8 (H) 02/15/2023   Lab Results  Component Value Date   VITAMINB12 310 04/20/2021   Lab Results  Component Value Date   TSH 6.310 (H) 02/15/2023     ASSESSMENT AND PLAN  73 y.o. year old female with anxiety/panic attack, COPD on home oxygen  who is presenting for follow up for her neuropathy.  She is on duloxetine  90 mg daily, and Pregabalin  25 mg daily.  She tells me that her neuropathy pain is better controlled with the combination of duloxetine  and pregabalin , but on occasion she will have pain in the evening.  Will continue patient on duloxetine  90 mg daily and increase the pregabalin  to 25 mg twice daily.  Advised her to continue following up with her doctors and I will see her in 1 year or sooner if worse.   1. Other polyneuropathy     Patient Instructions  Continue with duloxetine  90 mg daily Increase pregabalin  25 mg twice daily Continue other medication Follow-up in 1 year or sooner if worse.   No orders of the defined types were placed in this encounter.   Meds ordered this encounter  Medications   DULoxetine  (CYMBALTA ) 60 MG capsule    Sig: Take 1 capsule (60 mg total) by mouth daily.    Dispense:  90 capsule    Refill:  3   DULoxetine  (CYMBALTA ) 30 MG capsule    Sig: Take 1 capsule (30 mg total) by mouth daily.    Dispense:  90 capsule    Refill:  3   pregabalin  (LYRICA ) 25 MG capsule    Sig: Take 1 capsule (25 mg total) by mouth 2 (two) times daily.    Dispense:  60 capsule    Refill:  5    Return in about 1 year (around 05/30/2024).   Pastor Falling, MD 05/31/2023, 2:23 PM  [facility] [address], [address] [place], [place] [address] [phone]

## 2023-06-13 ENCOUNTER — Other Ambulatory Visit: Payer: Self-pay | Admitting: Family Medicine

## 2023-06-13 ENCOUNTER — Telehealth: Payer: Self-pay | Admitting: Pulmonary Disease

## 2023-06-13 DIAGNOSIS — G6181 Chronic inflammatory demyelinating polyneuritis: Secondary | ICD-10-CM

## 2023-06-13 DIAGNOSIS — G8929 Other chronic pain: Secondary | ICD-10-CM

## 2023-06-13 MED ORDER — HYDROCODONE-ACETAMINOPHEN 5-325 MG PO TABS
1.0000 | ORAL_TABLET | Freq: Two times a day (BID) | ORAL | 0 refills | Status: DC | PRN
Start: 2023-06-13 — End: 2023-07-26

## 2023-06-13 NOTE — Telephone Encounter (Signed)
PT needs Proaire refilled.Pharm has been trying to fax.  [facility] [address].

## 2023-06-13 NOTE — Telephone Encounter (Signed)
Copied from CRM [id number]. Topic: Clinical - Medication Refill >> Jun 13, 2023  1:04 PM Desma Mcgregor wrote: Most Recent Primary Care Visit:  Provider: Saralyn Pilar A  Department: [facility]  Visit Type: OFFICE VISIT 20  Date: 04/27/2023  Medication: HYDROcodone-acetaminophen (NORCO/VICODIN) 5-325 MG tablet   Has the patient contacted their pharmacy? Yes (Agent: If no, request that the patient contact the pharmacy for the refill. If patient does not wish to contact the pharmacy document the reason why and proceed with request.) (Agent: If yes, when and what did the pharmacy advise?) Pharmacy advised for patient to contact doctor's office  Is this the correct pharmacy for this prescription? Yes If no, delete pharmacy and type the correct one.  This is the patient's preferred pharmacy:  [id number] - [place], [place] - [address] [place] [address]  Has the prescription been filled recently? Yes  Is the patient out of the medication? No  Has the patient been seen for an appointment in the last year OR does the patient have an upcoming appointment? Yes  Can we respond through MyChart? No  Agent: Please be advised that Rx refills may take up to 3 business days. We ask that you follow-up with your pharmacy.

## 2023-06-16 ENCOUNTER — Ambulatory Visit: Payer: Self-pay | Admitting: Family Medicine

## 2023-06-16 NOTE — Telephone Encounter (Signed)
Chief Complaint: Congestion Symptoms: Runny nose, congestion, coughing Frequency: 3 days Pertinent Negatives: Patient denies fever, sore throat, ear ache Disposition: [] ED /[] Urgent Care (no appt availability in office) / [] Appointment(In office/virtual)/ []  [facility]/ [x] Home Care/ [] Refused Recommended Disposition /[] [place] [facility]/ []  Follow-up with PCP Additional Notes: Patient called in stating she is experiencing symptoms of a cold (coughing, congestion, and runny nose). Patient states it makes her harder to breathe with her COPD and was asking if her PCP, Lequita Halt, could call her in an antibiotic. Patient has not had a fever, or any color to her nasal drainage. Patient states she has a cough as well. Advised patient I will pass information to her PCP and let PCP determine if antibiotic is appropriate at this time. Patient is asking for a call back at her main number to let her know if abx will be called in. This RN educated patient on when to call back in with symptoms.   Copied from CRM [id number]. Topic: Clinical - Red Word Triage >> Jun 16, 2023  9:38 AM Fuller Mandril wrote: Red Word that prompted transfer to Nurse Triage: Trouble breathing, has COPD and cold has made it more difficult to breathe Reason for Disposition  [1] Sinus congestion as part of a cold AND [2] present < 10 days  Answer Assessment - Initial Assessment Questions 1. LOCATION: "Where does it hurt?"      No 2. ONSET: "When did the sinus pain start?"  (e.g., hours, days)      Night before last symptoms set in 3. SEVERITY: "How bad is the pain?"   (Scale 1-10; mild, moderate or severe)   - MILD (1-3): doesn't interfere with normal activities    - MODERATE (4-7): interferes with normal activities (e.g., work or school) or awakens from sleep   - SEVERE (8-10): excruciating pain and patient unable to do any normal activities        No real sinus pressure 4. RECURRENT SYMPTOM: "Have you ever had sinus  problems before?" If Yes, ask: "When was the last time?" and "What happened that time?"      No but brother has a cold 5. NASAL CONGESTION: "Is the nose blocked?" If Yes, ask: "Can you open it or must you breathe through your mouth?"     Yes 6. NASAL DISCHARGE: "Do you have discharge from your nose?" If so ask, "What color?"     Yes, clear 7. FEVER: "Do you have a fever?" If Yes, ask: "What is it, how was it measured, and when did it start?"      No 8. OTHER SYMPTOMS: "Do you have any other symptoms?" (e.g., sore throat, cough, earache, difficulty breathing) Coughing up clear phlegm, runny nose, COPD flaring up,  Protocols used: Sinus Pain or Congestion-A-AH

## 2023-06-16 NOTE — Telephone Encounter (Signed)
  Chief Complaint: na Symptoms: na Frequency: na Pertinent Negatives: Patient denies na Disposition: [] ED /[] Urgent Care (no appt availability in office) / [] Appointment(In office/virtual)/ []  [facility]/ [] Home Care/ [] Refused Recommended Disposition /[] [place] [facility]/ []  Follow-up with PCP Additional Notes: Patient called and states that she doesn't think she needs any antibiotics but asked if she could get a refill prescription for Proair.  She states that her pulmonologist said that she could use Proair when she goes off somewhere and if she needs an inhaler.  She is advised of Domenic Schwab recommendation to use Flonase spray and guaifenesin (plain Mucinex) and her Albuterol inhaler as well.  Patient was triaged earlier today and given home care advice.  She has not had any changes and does not sound out of breathe while speaking with this RN.  She is also advised that if she gets worse over to the weekend to go to the Emergency Room.  Patient verbalized understanding.  Reason for Disposition  [1] Caller requesting NON-URGENT health information AND [2] PCP's office is the best resource  Answer Assessment - Initial Assessment Questions 1. REASON FOR CALL or QUESTION: "What is your reason for calling today?" or "How can I best help you?" or "What question do you have that I can help answer?"     Patient wanted to see if she could get a prescription of Proair sent to her pharmacy  Protocols used: Information Only Call - No Triage-A-AH

## 2023-06-16 NOTE — Telephone Encounter (Signed)
Agree with home care, given that symptoms have only been ongoing for a few days it is most likely a viral infection and antibiotics will not do anything to help it.  She can use Flonase spray and guaifenesin (plain Mucinex) to improve congestion.  She should use her albuterol as needed if she is having trouble breathing.

## 2023-06-19 NOTE — Telephone Encounter (Signed)
Called pt she is advised of the PCP reccommendation

## 2023-06-19 NOTE — Telephone Encounter (Signed)
Called pt she stated that she is feeling a little better

## 2023-06-20 MED ORDER — ALBUTEROL SULFATE HFA 108 (90 BASE) MCG/ACT IN AERS
2.0000 | INHALATION_SPRAY | Freq: Four times a day (QID) | RESPIRATORY_TRACT | 6 refills | Status: AC | PRN
Start: 2023-06-20 — End: ?

## 2023-06-20 NOTE — Telephone Encounter (Signed)
I have sent refill on albuterol inhaler  I called and spoke with the pt and notified her that this was done  Nothing further needed

## 2023-06-26 ENCOUNTER — Encounter: Payer: Self-pay | Admitting: Podiatry

## 2023-06-26 ENCOUNTER — Ambulatory Visit (INDEPENDENT_AMBULATORY_CARE_PROVIDER_SITE_OTHER): Payer: 59 | Admitting: Podiatry

## 2023-06-26 DIAGNOSIS — B351 Tinea unguium: Secondary | ICD-10-CM

## 2023-06-26 DIAGNOSIS — M79675 Pain in left toe(s): Secondary | ICD-10-CM | POA: Diagnosis not present

## 2023-06-26 DIAGNOSIS — M79674 Pain in right toe(s): Secondary | ICD-10-CM

## 2023-06-29 ENCOUNTER — Encounter: Payer: Self-pay | Admitting: Family Medicine

## 2023-06-29 ENCOUNTER — Other Ambulatory Visit: Payer: Self-pay | Admitting: Nurse Practitioner

## 2023-06-29 DIAGNOSIS — G8929 Other chronic pain: Secondary | ICD-10-CM

## 2023-06-30 ENCOUNTER — Ambulatory Visit: Payer: Self-pay | Admitting: Family Medicine

## 2023-06-30 NOTE — Telephone Encounter (Signed)
 Chief Complaint: anxiety Symptoms: increased anxiety, restlessness Frequency: 1 week Pertinent Negatives: Patient denies SI/HI/AVH Disposition: [] ED /[] Urgent Care (no appt availability in office) / [x] Appointment(In office/virtual)/ []  [place] [facility]/ [] Home Care/ [] Refused Recommended Disposition /[] [place] [facility]/ []  Follow-up with PCP Additional Notes: Patient calls requesting dose change in her xanax . States she is taking 1.5 tablets as prescribed with minimal relief. Patient reports she has taken two tablets at once and did not feel relief. States they do not work as well as the blue ones. This RN reminded patient to take medication as prescribed. Per protocol, patient to be evaluated within 3 days. Patient scheduled with provider in clinic for 07/03/23 @ 1350 at patient request. Care advice reviewed, patient verbalized understandingand denies further questions at this time. Alerting PCP for review.    Copied from CRM [id number]. Topic: Clinical - Medication Question >> Jun 30, 2023  1:21 PM Carmell R wrote: Reason for CRM: For ALPRAZolam  (XANAX ) 0.5 MG tablet. Patient was taking the 1mg . The 0.5mg  is not helping her and wants to know if she can get back on the 1mg ? She explained that she is so nervous, she can't be still with her anxiety and previous panic attacks. Please f/u asap Reason for Disposition  MODERATE anxiety (e.g., persistent or frequent anxiety symptoms; interferes with sleep, school, or work)  Answer Assessment - Initial Assessment Questions 1. CONCERN: Did anything happen that prompted you to call today?      States her medication is no longer working. ALPRAZolam  (XANAX ) 0.5 MG- states nothing has happened today, states the blue ones worked better. 2. ANXIETY SYMPTOMS: Can you describe how you (your loved one; patient) have been feeling? (e.g., tense, restless, panicky, anxious, keyed up, overwhelmed, sense of impending doom).      Anxious, restless,  panicky 3. ONSET: How long have you been feeling this way? (e.g., hours, days, weeks)     About a week 4. SEVERITY: How would you rate the level of anxiety? (e.g., 0 - 10; or mild, moderate, severe).     8/10 5. FUNCTIONAL IMPAIRMENT: How have these feelings affected your ability to do daily activities? Have you had more difficulty than usual doing your normal daily activities? (e.g., getting better, same, worse; self-care, school, work, interactions)     Yes, states I cannot calm down. 6. HISTORY: Have you felt this way before? Have you ever been diagnosed with an anxiety problem in the past? (e.g., generalized anxiety disorder, panic attacks, PTSD). If Yes, ask: How was this problem treated? (e.g., medicines, counseling, etc.)     Denies having it this bad before. 7. RISK OF HARM - SUICIDAL IDEATION: Do you ever have thoughts of hurting or killing yourself? If Yes, ask:  Do you have these feelings now? Do you have a plan on how you would do this?     Denies SI/HI/AVH 8. TREATMENT:  What has been done so far to treat this anxiety? (e.g., medicines, relaxation strategies). What has helped?     1 mg xanax  9. TREATMENT - THERAPIST: Do you have a counselor or therapist? Name?     Denies 10. POTENTIAL TRIGGERS: Do you drink caffeinated beverages (e.g., coffee, colas, teas), and how much daily? Do you drink alcohol or use any drugs? Have you started any new medicines recently?       States she cannot identify triggers 11. PATIENT SUPPORT: Who is with you now? Who do you live with? Do you have family or friends who  you can talk to?        Lives with brother, states he is helpful 12. OTHER SYMPTOMS: Do you have any other symptoms? (e.g., feeling depressed, trouble concentrating, trouble sleeping, trouble breathing, palpitations or fast heartbeat, chest pain, sweating, nausea, or diarrhea)       Denies, states heart can beat fast with panic attacks  Protocols  used: Anxiety and Panic Attack-A-AH

## 2023-07-01 ENCOUNTER — Encounter: Payer: Self-pay | Admitting: Family Medicine

## 2023-07-03 ENCOUNTER — Ambulatory Visit (INDEPENDENT_AMBULATORY_CARE_PROVIDER_SITE_OTHER): Payer: 59 | Admitting: Family Medicine

## 2023-07-03 ENCOUNTER — Telehealth: Payer: Self-pay

## 2023-07-03 ENCOUNTER — Encounter: Payer: Self-pay | Admitting: Family Medicine

## 2023-07-03 VITALS — BP 121/81 | HR 86 | Temp 97.8°F | Ht 68.0 in | Wt 141.0 lb

## 2023-07-03 DIAGNOSIS — F410 Panic disorder [episodic paroxysmal anxiety] without agoraphobia: Secondary | ICD-10-CM

## 2023-07-03 DIAGNOSIS — R051 Acute cough: Secondary | ICD-10-CM

## 2023-07-03 DIAGNOSIS — F411 Generalized anxiety disorder: Secondary | ICD-10-CM | POA: Diagnosis not present

## 2023-07-03 MED ORDER — ALPRAZOLAM 0.25 MG PO TABS
ORAL_TABLET | ORAL | 0 refills | Status: DC
Start: 2023-10-01 — End: 2023-07-26

## 2023-07-03 MED ORDER — ALPRAZOLAM 0.5 MG PO TABS: ORAL_TABLET | ORAL | 0 refills | Status: DC

## 2023-07-03 MED ORDER — ALPRAZOLAM 0.5 MG PO TABS
0.5000 mg | ORAL_TABLET | Freq: Two times a day (BID) | ORAL | 0 refills | Status: DC | PRN
Start: 2023-09-01 — End: 2023-07-26

## 2023-07-03 MED ORDER — ALPRAZOLAM 0.25 MG PO TABS
0.2500 mg | ORAL_TABLET | Freq: Two times a day (BID) | ORAL | 0 refills | Status: DC | PRN
Start: 2023-10-31 — End: 2023-07-26

## 2023-07-03 MED ORDER — ALPRAZOLAM 0.5 MG PO TABS
0.7500 mg | ORAL_TABLET | Freq: Two times a day (BID) | ORAL | 0 refills | Status: DC | PRN
Start: 2023-07-03 — End: 2023-07-26

## 2023-07-03 MED ORDER — ALPRAZOLAM 0.25 MG PO TABS: 0.2500 mg | ORAL_TABLET | Freq: Every day | ORAL | 0 refills | Status: DC

## 2023-07-03 MED ORDER — DEXTROMETHORPHAN HBR 15 MG PO CAPS: 30.0000 mg | ORAL_CAPSULE | Freq: Three times a day (TID) | ORAL | 0 refills | Status: DC | PRN

## 2023-07-03 NOTE — Assessment & Plan Note (Signed)
 PHQ-9 and GAD-7 scores stable.  Discussed again to clarify that Xanax  is only recommended to be used as rescue medication on an occasional basis and is not safe to take daily, particularly for people over 75 years old.  We discussed that her duloxetine  is intended not only for nerve pain, but also to keep anxiety/panic attacks under control.  If she does have any panic attacks not controlled by the duloxetine , that is when she should be using the Xanax .  Patient verbalized understanding and is agreeable to tapering off of daily Xanax  to use it only on an as-needed basis.  Provided instructions for tapering schedule:  Xanax  TAPER SCHEDULE: If at any point you try to move to the next step and start to experience withdrawal symptoms, stay at the last comfortable dose for an additional 5-7 days. Withdrawal symptoms include rebound anxiety, irritability, insomnia, nightmares, agitation, aggression, seizures, depression, hallucinations, muscle stiffness, flu-like symptoms, paresthesia, GI disturbances, decreased memory and concentration, weakness, and visual disturbances.   Weeks 1 to 4: Xanax  0.75 mg twice daily: Work towards only taking 1.5 tablets (0.75 mg) each morning and 0.75 mg each evening.  If you start to experience withdrawal symptoms like racing heart, GI upset, stay at the last comfortable dosing schedule for an additional 5-7 days. Weeks 5 to 8: Xanax  1.5 tablets (0.75 mg) each morning and 1 tablet (0.5 mg) each evening. Weeks 9 to 12: Xanax  1 tablet (0.5 mg) each morning and 1 tablet (0.5 mg each evening. Weeks 13 to 15: Xanax  1 tablet (0.5 mg) each morning and 0.5 tablet (0.25 mg) each evening. Weeks 16 to 18: Xanax  0.5 tablet (0.25 mg) each morning and 0.5 tablet (0.25 mg) each evening. Weeks 19 to 21: Xanax  0.5 tablet (0.25) mg each morning Weeks 22+: only take Xanax  as needed for panic attacks

## 2023-07-03 NOTE — Patient Instructions (Addendum)
 ANXIETY & PANIC ATTACKS: Since it is not safe and not recommended to take Xanax  every day due to the risk of side effects and physical dependence, follow this schedule to wean off of needing it every day.  Xanax  TAPER SCHEDULE: If at any point you try to move to the next step and start to experience withdrawal symptoms, stay at the last comfortable dose for an additional 5-7 days. Withdrawal symptoms include rebound anxiety, irritability, insomnia, nightmares, agitation, aggression, seizures, depression, hallucinations, muscle stiffness, flu-like symptoms, paresthesia, GI disturbances, decreased memory and concentration, weakness, and visual disturbances.   Weeks 1 to 4: Xanax  0.75 mg twice daily: Work towards only taking 1.5 tablets (0.75 mg) each morning and 0.75 mg each evening.  If you start to experience withdrawal symptoms like racing heart, GI upset, stay at the last comfortable dosing schedule for an additional 5-7 days. Weeks 5 to 8: Xanax  1.5 tablets (0.75 mg) each morning and 1 tablet (0.5 mg) each evening. Weeks 9 to 12: Xanax  1 tablet (0.5 mg) each morning and 1 tablet (0.5 mg each evening. Weeks 13 to 15: Xanax  1 tablet (0.5 mg) each morning and 0.5 tablet (0.25 mg) each evening. Weeks 16 to 18: Xanax  0.5 tablet (0.25 mg) each morning and 0.5 tablet (0.25 mg) each evening. Weeks 19 to 21: Xanax  0.5 tablet (0.25) mg each morning Weeks 22+: only take Xanax  as needed for panic attacks  Controlling anxiety and preventing panic attacks: This is what your duloxetine  90 mg daily is for in addition to also helping with your nerve pain.  RESCUE medicine for panic attacks: Once we wean you off of daily Xanax , you should only need to take it every once in a while if you do have a breakthrough panic attack.  Ideally, this should be only 1-2 times each week.

## 2023-07-03 NOTE — Progress Notes (Signed)
 Acute Office Visit  Subjective:     Patient ID: Vickie Bowman, female    DOB: 1950/10/31, 73 y.o.   MRN: [id number]  Chief Complaint  Patient presents with   Acute Visit    HPI Patient is in today to discuss anxiety/panic attacks and acute cough.  She reports that she last week and symptoms have resolved aside from ongoing cough.  She is still using her Breztri  inhaler daily for COPD maintenance but has increased her use of albuterol  rescue inhaler since the URI.  She has been using Mucinex  at home with no relief of cough.  She reports that she is typically taking 1.5-2 tablets of Xanax  twice a day, and sometimes an additional tablet if she has a panic attack.  She reports that she takes the tablets daily "to prevent panic attacks" in addition to treating acute attacks.     07/03/2023    2:03 PM 04/27/2023    1:19 PM 01/26/2023    2:28 PM  Depression screen PHQ 2/9  Decreased Interest 0 0 0  Down, Depressed, Hopeless 0 0 0  PHQ - 2 Score 0 0 0  Altered sleeping 0 0 0  Tired, decreased energy 0 0 0  Change in appetite 0 0 0  Feeling bad or failure about yourself  0 0 0  Trouble concentrating 0 0 0  Moving slowly or fidgety/restless 0 0 0  Suicidal thoughts 0 0 0  PHQ-9 Score 0 0 0  Difficult doing work/chores Not difficult at all Not difficult at all Not difficult at all      07/03/2023    2:03 PM 04/27/2023    1:19 PM 06/22/2022    2:56 PM 03/16/2022    1:37 PM  GAD 7 : Generalized Anxiety Score  Nervous, Anxious, on Edge 1 0 0 0  Control/stop worrying 0 0 0 0  Worry too much - different things 0 0 0 0  Trouble relaxing 0 0 0 0  Restless 0 0 0 0  Easily annoyed or irritable 0 0 0 0  Afraid - awful might happen 0 0 0 0  Total GAD 7 Score 1 0 0 0  Anxiety Difficulty Not difficult at all Not difficult at all       ROS See HPI    Objective:    BP 121/81   Pulse 86   Temp 97.8 F (36.6 C) (Temporal)   Ht 5\' 8"  (1.727 m)   Wt 141 lb (64 kg)   SpO2 94%   BMI  21.44 kg/m   Physical Exam Constitutional:      General: She is not in acute distress.    Appearance: Normal appearance.  HENT:     Head: Normocephalic and atraumatic.  Cardiovascular:     Rate and Rhythm: Normal rate and regular rhythm.     Heart sounds: No murmur heard.    No friction rub. No gallop.  Pulmonary:     Effort: Pulmonary effort is normal. No respiratory distress.     Breath sounds: No wheezing, rhonchi or rales.  Skin:    General: Skin is warm and dry.  Neurological:     Mental Status: She is alert and oriented to person, place, and time.       Assessment & Plan:  Generalized anxiety disorder Assessment & Plan: PHQ-9 and GAD-7 scores stable.  Discussed again to clarify that Xanax  is only recommended to be used as rescue medication on an occasional basis and  is not safe to take daily, particularly for people over 49 years old.  We discussed that her duloxetine  is intended not only for nerve pain, but also to keep anxiety/panic attacks under control.  If she does have any panic attacks not controlled by the duloxetine , that is when she should be using the Xanax .  Patient verbalized understanding and is agreeable to tapering off of daily Xanax  to use it only on an as-needed basis.  Provided instructions for tapering schedule:  Xanax  TAPER SCHEDULE: If at any point you try to move to the next step and start to experience withdrawal symptoms, stay at the last comfortable dose for an additional 5-7 days. Withdrawal symptoms include rebound anxiety, irritability, insomnia, nightmares, agitation, aggression, seizures, depression, hallucinations, muscle stiffness, flu-like symptoms, paresthesia, GI disturbances, decreased memory and concentration, weakness, and visual disturbances.   Weeks 1 to 4: Xanax  0.75 mg twice daily: Work towards only taking 1.5 tablets (0.75 mg) each morning and 0.75 mg each evening.  If you start to experience withdrawal symptoms like racing heart, GI  upset, stay at the last comfortable dosing schedule for an additional 5-7 days. Weeks 5 to 8: Xanax  1.5 tablets (0.75 mg) each morning and 1 tablet (0.5 mg) each evening. Weeks 9 to 12: Xanax  1 tablet (0.5 mg) each morning and 1 tablet (0.5 mg each evening. Weeks 13 to 15: Xanax  1 tablet (0.5 mg) each morning and 0.5 tablet (0.25 mg) each evening. Weeks 16 to 18: Xanax  0.5 tablet (0.25 mg) each morning and 0.5 tablet (0.25 mg) each evening. Weeks 19 to 21: Xanax  0.5 tablet (0.25) mg each morning Weeks 22+: only take Xanax  as needed for panic attacks  Orders: -     ALPRAZolam ; Take 1.5 tablets (0.75 mg total) by mouth 2 (two) times daily as needed for anxiety. (Week 1-4 of taper schedule)  Dispense: 90 tablet; Refill: 0 -     ALPRAZolam ; Take 1.5 tablets (0.75 mg total) by mouth every morning AND 1 tablet (0.5 mg total) every evening. (Week 5-8 of taper schedule).  Dispense: 75 tablet; Refill: 0 -     ALPRAZolam ; Take 1 tablet (0.5 mg total) by mouth 2 (two) times daily as needed for anxiety. (Week 9-12 of taper schedule)  Dispense: 60 tablet; Refill: 0 -     ALPRAZolam ; Take 2 tablets (0.5 mg total) by mouth every morning AND 1 tablet (0.25 mg total) every evening. (Week 13-15 of taper schedule).  Dispense: 90 tablet; Refill: 0 -     ALPRAZolam ; Take 1 tablet (0.25 mg total) by mouth 2 (two) times daily as needed for anxiety. (Week 16-18 of taper schedule)  Dispense: 60 tablet; Refill: 0 -     ALPRAZolam ; Take 1 tablet (0.25 mg total) by mouth daily. (Week 19-21 of taper schedule)  Dispense: 30 tablet; Refill: 0  Panic attacks -     ALPRAZolam ; Take 1.5 tablets (0.75 mg total) by mouth 2 (two) times daily as needed for anxiety. (Week 1-4 of taper schedule)  Dispense: 90 tablet; Refill: 0 -     ALPRAZolam ; Take 1.5 tablets (0.75 mg total) by mouth every morning AND 1 tablet (0.5 mg total) every evening. (Week 5-8 of taper schedule).  Dispense: 75 tablet; Refill: 0 -     ALPRAZolam ; Take 1 tablet  (0.5 mg total) by mouth 2 (two) times daily as needed for anxiety. (Week 9-12 of taper schedule)  Dispense: 60 tablet; Refill: 0 -     ALPRAZolam ; Take 2 tablets (  0.5 mg total) by mouth every morning AND 1 tablet (0.25 mg total) every evening. (Week 13-15 of taper schedule).  Dispense: 90 tablet; Refill: 0 -     ALPRAZolam ; Take 1 tablet (0.25 mg total) by mouth 2 (two) times daily as needed for anxiety. (Week 16-18 of taper schedule)  Dispense: 60 tablet; Refill: 0 -     ALPRAZolam ; Take 1 tablet (0.25 mg total) by mouth daily. (Week 19-21 of taper schedule)  Dispense: 30 tablet; Refill: 0  Acute cough -     Dextromethorphan  HBr; Take 2 capsules (30 mg total) by mouth 3 (three) times daily as needed (cough).  Dispense: 28 capsule; Refill: 0  No adventitious lung sounds on exam, COPD generally well-controlled.  Start short course of dextromethorphan  for acute cough.  Continue Breztri  and albuterol  inhalers as prescribed.  Return in about 23 days (around 07/26/2023) for follow-up for anxiety, weaning off of Xanax .  Noreene Bearded, PA

## 2023-07-03 NOTE — Telephone Encounter (Signed)
 Copied from CRM [id number]. Topic: Clinical - Prescription Issue >> Jul 03, 2023  3:22 PM Brynn Caras wrote: Reason for CRM: PT states she visited her pharmacy to pick up her prescriptions, she states the Xanax  was not sent over yet. She is wanting to know if this will still be sent over to the pharmacy by the end of the day?

## 2023-07-03 NOTE — Telephone Encounter (Signed)
Called pt she is advised

## 2023-07-03 NOTE — Telephone Encounter (Signed)
Just sent it over.

## 2023-07-19 ENCOUNTER — Telehealth: Payer: Self-pay | Admitting: Pulmonary Disease

## 2023-07-19 NOTE — Telephone Encounter (Signed)
 Patient saw an ad on her phone called oxygen boost and  [employer] . She was wondering if it would be safe for her to use. [phone]

## 2023-07-19 NOTE — Telephone Encounter (Signed)
 Patient scheduled to see you in June.  Please advise, thank you!

## 2023-07-20 NOTE — Telephone Encounter (Signed)
 These are different treatment options for COPD patients. They can be discussed at office visits going forward.  She needs to establish with new pulmonary provider as Dr. Tonia Brooms has left she can set this up at the first available

## 2023-07-21 NOTE — Telephone Encounter (Signed)
 Patient is aware of below message and voiced her understanding.  Pending appt 11/13/2023.

## 2023-07-26 ENCOUNTER — Ambulatory Visit (INDEPENDENT_AMBULATORY_CARE_PROVIDER_SITE_OTHER): Payer: 59 | Admitting: Family Medicine

## 2023-07-26 ENCOUNTER — Encounter: Payer: Self-pay | Admitting: Family Medicine

## 2023-07-26 VITALS — BP 123/65 | HR 91 | Ht 68.0 in | Wt 141.2 lb

## 2023-07-26 DIAGNOSIS — R8290 Unspecified abnormal findings in urine: Secondary | ICD-10-CM

## 2023-07-26 DIAGNOSIS — G8929 Other chronic pain: Secondary | ICD-10-CM

## 2023-07-26 DIAGNOSIS — G6181 Chronic inflammatory demyelinating polyneuritis: Secondary | ICD-10-CM | POA: Diagnosis not present

## 2023-07-26 DIAGNOSIS — F411 Generalized anxiety disorder: Secondary | ICD-10-CM | POA: Diagnosis not present

## 2023-07-26 DIAGNOSIS — M5442 Lumbago with sciatica, left side: Secondary | ICD-10-CM

## 2023-07-26 DIAGNOSIS — M5441 Lumbago with sciatica, right side: Secondary | ICD-10-CM

## 2023-07-26 DIAGNOSIS — F410 Panic disorder [episodic paroxysmal anxiety] without agoraphobia: Secondary | ICD-10-CM | POA: Diagnosis not present

## 2023-07-26 LAB — POCT URINALYSIS DIP (MANUAL ENTRY)
Bilirubin, UA: NEGATIVE
Blood, UA: NEGATIVE
Glucose, UA: NEGATIVE mg/dL
Ketones, POC UA: NEGATIVE mg/dL
Nitrite, UA: NEGATIVE
Protein Ur, POC: NEGATIVE mg/dL
Spec Grav, UA: 1.025 (ref 1.010–1.025)
Urobilinogen, UA: 0.2 U/dL
pH, UA: 5.5 (ref 5.0–8.0)

## 2023-07-26 MED ORDER — ALPRAZOLAM 0.25 MG PO TABS: 0.2500 mg | ORAL_TABLET | Freq: Every day | ORAL | 0 refills | Status: DC

## 2023-07-26 MED ORDER — HYDROCODONE-ACETAMINOPHEN 5-325 MG PO TABS: 1.0000 | ORAL_TABLET | Freq: Two times a day (BID) | ORAL | 0 refills | Status: DC | PRN

## 2023-07-26 MED ORDER — ALPRAZOLAM 0.25 MG PO TABS: 0.2500 mg | ORAL_TABLET | Freq: Two times a day (BID) | ORAL | 0 refills | Status: DC | PRN

## 2023-07-26 MED ORDER — ALPRAZOLAM 0.25 MG PO TABS: ORAL_TABLET | ORAL | 0 refills | Status: AC

## 2023-07-26 NOTE — Assessment & Plan Note (Signed)
 Working to taper off of Xanax given that taking hydrocodone increases her risk of falls, delirium, and has potential interactions with Xanax and pregabalin.  Refilling hydrocodone-acetaminophen 5-325 mg as previously for breakthrough severe pain.  PDMP reviewed, no aberrancies.  Overdose risk score 050.  Will continue to monitor.

## 2023-07-26 NOTE — Assessment & Plan Note (Signed)
 Continue to work on tapering off of Xanax.  Sent prescription for next step in taper schedule.  Xanax TAPER SCHEDULE: If at any point you try to move to the next step and start to experience withdrawal symptoms, stay at the last comfortable dose for an additional 5-7 days. Withdrawal symptoms include rebound anxiety, irritability, insomnia, nightmares, agitation, aggression, seizures, depression, hallucinations, muscle stiffness, flu-like symptoms, paresthesia, GI disturbances, decreased memory and concentration, weakness, and visual disturbances.    Weeks 13 to 15: Xanax 1 tablet (0.5 mg) each morning and 0.5 tablet (0.25 mg) each evening. Weeks 16 to 18: Xanax 0.5 tablet (0.25 mg) each morning and 0.5 tablet (0.25 mg) each evening. Weeks 19 to 21: Xanax 0.5 tablet (0.25) mg each morning Weeks 22+: only take Xanax as needed for panic attacks

## 2023-07-26 NOTE — Progress Notes (Signed)
 Established Patient Office Visit  Subjective   Patient ID: Vickie Bowman, female    DOB: 1951-04-29  Age: 73 y.o. MRN: [id number]  Chief Complaint  Patient presents with   Back Pain   Mood    HPI Vickie Bowman is a 73 y.o. female presenting today for follow up of back pain management, mood.  She also endorses that her urine has developed a "strange odor".  She states that her urine has been darker than usual with an odor that she cannot describe.  She states it is not sweet or fishy.  Denies dysuria, urinary urgency or frequency, new back or flank pain, fever/chills, nausea or vomiting.  At her last appointment about 1 month ago, started taper schedule for Xanax (see below).  She has already worked her way down to just 1 tablet each morning and 1 tablet each evening without issue.  She continues to follow with neurology and take Cymbalta and pregabalin for back pain.  She states that acetaminophen alone is ineffective for her back pain, takes hydrocodone-acetaminophen 5-325 mg 2-3 times a week.  Xanax Taper started 07/03/2023: Weeks 1 to 4: Xanax 0.75 mg twice daily: Work towards only taking 1.5 tablets (0.75 mg) each morning and 0.75 mg each evening.  If you start to experience withdrawal symptoms like racing heart, GI upset, stay at the last comfortable dosing schedule for an additional 5-7 days. Weeks 5 to 8: Xanax 1.5 tablets (0.75 mg) each morning and 1 tablet (0.5 mg) each evening. Weeks 9 to 12: Xanax 1 tablet (0.5 mg) each morning and 1 tablet (0.5 mg) each evening. Weeks 13 to 15: Xanax 1 tablet (0.5 mg) each morning and 0.5 tablet (0.25 mg) each evening. Weeks 16 to 18: Xanax 0.5 tablet (0.25 mg) each morning and 0.5 tablet (0.25 mg) each evening. Weeks 19 to 21: Xanax 0.5 tablet (0.25) mg each morning Weeks 22+: only take Xanax as needed for panic attacks  Outpatient Medications Prior to Visit  Medication Sig   albuterol (PROVENTIL) (2.5 MG/3ML) 0.083% nebulizer solution  Take 3 mLs (2.5 mg total) by nebulization every 6 (six) hours as needed for wheezing or shortness of breath. J44.9 J96.11   albuterol (VENTOLIN HFA) 108 (90 Base) MCG/ACT inhaler Inhale 2 puffs into the lungs every 6 (six) hours as needed for wheezing or shortness of breath.   aspirin EC 81 MG EC tablet Take 1 tablet (81 mg total) by mouth daily.   BREZTRI AEROSPHERE 160-9-4.8 MCG/ACT AERO INHALE 2 PUFFS BY MOUTH INTO THE LUNGS IN THE MORNING AND AT BEDTIME   Cholecalciferol (VITAMIN D) 2000 units CAPS Take 1 capsule (2,000 Units total) by mouth daily.   Dextromethorphan HBr 15 MG CAPS Take 2 capsules (30 mg total) by mouth 3 (three) times daily as needed (cough).   diclofenac Sodium (VOLTAREN) 1 % GEL Apply 2 g topically 4 (four) times daily. To affected joint.   DULoxetine (CYMBALTA) 30 MG capsule Take 1 capsule (30 mg total) by mouth daily.   DULoxetine (CYMBALTA) 60 MG capsule Take 1 capsule (60 mg total) by mouth daily.   ipratropium-albuterol (DUONEB) 0.5-2.5 (3) MG/3ML SOLN Take 3 mLs by nebulization every 6 (six) hours as needed.   ketoconazole (NIZORAL) 2 % cream Apply 1 Application topically daily.   meloxicam (MOBIC) 7.5 MG tablet Take 1 tablet by mouth once daily   metoprolol succinate (TOPROL-XL) 25 MG 24 hr tablet Take 0.5 tablets (12.5 mg total) by mouth daily.   pregabalin (  LYRICA) 25 MG capsule Take 1 capsule (25 mg total) by mouth 2 (two) times daily.   rosuvastatin (CRESTOR) 20 MG tablet Take 1 tablet by mouth once daily   Spacer/Aero-Holding Chambers (AEROCHAMBER MV) inhaler Use as instructed   [DISCONTINUED] ALPRAZolam (XANAX) 0.25 MG tablet Take 2 tablets (0.5 mg total) by mouth every morning AND 1 tablet (0.25 mg total) every evening. (Week 13-15 of taper schedule).   [DISCONTINUED] ALPRAZolam (XANAX) 0.25 MG tablet Take 1 tablet (0.25 mg total) by mouth 2 (two) times daily as needed for anxiety. (Week 16-18 of taper schedule)   [DISCONTINUED] ALPRAZolam (XANAX) 0.25 MG  tablet Take 1 tablet (0.25 mg total) by mouth daily. (Week 19-21 of taper schedule)   [DISCONTINUED] ALPRAZolam (XANAX) 0.5 MG tablet Take 1.5 tablets (0.75 mg total) by mouth 2 (two) times daily as needed for anxiety. (Week 1-4 of taper schedule)   [DISCONTINUED] ALPRAZolam (XANAX) 0.5 MG tablet Take 1.5 tablets (0.75 mg total) by mouth every morning AND 1 tablet (0.5 mg total) every evening. (Week 5-8 of taper schedule).   [DISCONTINUED] ALPRAZolam (XANAX) 0.5 MG tablet Take 1 tablet (0.5 mg total) by mouth 2 (two) times daily as needed for anxiety. (Week 9-12 of taper schedule)   [DISCONTINUED] HYDROcodone-acetaminophen (NORCO/VICODIN) 5-325 MG tablet Take 1 tablet by mouth 2 (two) times daily as needed for severe pain (pain score 7-10).   No facility-administered medications prior to visit.    ROS Negative unless otherwise noted in HPI   Objective:     BP 123/65   Pulse 91   Ht 5\' 8"  (1.727 m)   Wt 141 lb 4 oz (64.1 kg)   SpO2 91% Comment: @2L   BMI 21.48 kg/m   Physical Exam Constitutional:      General: She is not in acute distress.    Appearance: Normal appearance.  HENT:     Head: Normocephalic and atraumatic.  Cardiovascular:     Rate and Rhythm: Normal rate and regular rhythm.     Heart sounds: No murmur heard.    No friction rub. No gallop.  Pulmonary:     Effort: Pulmonary effort is normal. No respiratory distress.     Breath sounds: No wheezing, rhonchi or rales.  Skin:    General: Skin is warm and dry.  Neurological:     Mental Status: She is alert and oriented to person, place, and time.    Results for orders placed or performed in visit on 07/26/23  POCT urinalysis dipstick  Result Value Ref Range   Color, UA yellow yellow   Clarity, UA clear clear   Glucose, UA negative negative mg/dL   Bilirubin, UA negative negative   Ketones, POC UA negative negative mg/dL   Spec Grav, UA 8.295 6.213 - 1.025   Blood, UA negative negative   pH, UA 5.5 5.0 - 8.0    Protein Ur, POC negative negative mg/dL   Urobilinogen, UA 0.2 0.2 or 1.0 E.U./dL   Nitrite, UA Negative Negative   Leukocytes, UA Trace (A) Negative     Assessment & Plan:  Chronic inflammatory demyelinating polyneuropathy (HCC) -     HYDROcodone-Acetaminophen; Take 1 tablet by mouth 2 (two) times daily as needed for severe pain (pain score 7-10).  Dispense: 30 tablet; Refill: 0  Chronic midline low back pain with bilateral sciatica Assessment & Plan: Working to taper off of Xanax given that taking hydrocodone increases her risk of falls, delirium, and has potential interactions with Xanax and pregabalin.  Refilling hydrocodone-acetaminophen 5-325 mg as previously for breakthrough severe pain.  PDMP reviewed, no aberrancies.  Overdose risk score 050.  Will continue to monitor.  Orders: -     HYDROcodone-Acetaminophen; Take 1 tablet by mouth 2 (two) times daily as needed for severe pain (pain score 7-10).  Dispense: 30 tablet; Refill: 0  Generalized anxiety disorder Assessment & Plan: Continue to work on tapering off of Xanax.  Sent prescription for next step in taper schedule.  Xanax TAPER SCHEDULE: If at any point you try to move to the next step and start to experience withdrawal symptoms, stay at the last comfortable dose for an additional 5-7 days. Withdrawal symptoms include rebound anxiety, irritability, insomnia, nightmares, agitation, aggression, seizures, depression, hallucinations, muscle stiffness, flu-like symptoms, paresthesia, GI disturbances, decreased memory and concentration, weakness, and visual disturbances.    Weeks 13 to 15: Xanax 1 tablet (0.5 mg) each morning and 0.5 tablet (0.25 mg) each evening. Weeks 16 to 18: Xanax 0.5 tablet (0.25 mg) each morning and 0.5 tablet (0.25 mg) each evening. Weeks 19 to 21: Xanax 0.5 tablet (0.25) mg each morning Weeks 22+: only take Xanax as needed for panic attacks  Orders: -     ALPRAZolam; Take 2 tablets (0.5 mg total) by  mouth every morning AND 1 tablet (0.25 mg total) every evening. (Week 13-15 of taper schedule).  Dispense: 90 tablet; Refill: 0 -     ALPRAZolam; Take 1 tablet (0.25 mg total) by mouth daily. (Week 19-21 of taper schedule)  Dispense: 30 tablet; Refill: 0 -     ALPRAZolam; Take 1 tablet (0.25 mg total) by mouth 2 (two) times daily as needed for anxiety. (Week 16-18 of taper schedule)  Dispense: 60 tablet; Refill: 0  Panic attacks -     ALPRAZolam; Take 2 tablets (0.5 mg total) by mouth every morning AND 1 tablet (0.25 mg total) every evening. (Week 13-15 of taper schedule).  Dispense: 90 tablet; Refill: 0 -     ALPRAZolam; Take 1 tablet (0.25 mg total) by mouth daily. (Week 19-21 of taper schedule)  Dispense: 30 tablet; Refill: 0 -     ALPRAZolam; Take 1 tablet (0.25 mg total) by mouth 2 (two) times daily as needed for anxiety. (Week 16-18 of taper schedule)  Dispense: 60 tablet; Refill: 0  Foul smelling urine -     POCT urinalysis dipstick -     Urine Culture; Future  Urinalysis with trace leukocytes but inconclusive for definitive cystitis, sending for urine culture.  In the meantime, discussed the importance of drinking at least 64 ounces of fluid each day with the goal of at least half of that being plain water.  Patient verbalized understanding and is agreeable to this plan.  Return in about 3 months (around 10/26/2023) for follow-up for back pain, anxiety (tapering off Xanax).    Melida Quitter, PA

## 2023-07-26 NOTE — Patient Instructions (Signed)
 XANAX: Keep up the great work! The next step is: Xanax 1 tablet (0.5 mg) each morning and 0.5 tablet (0.25 mg) each evening.  URINE: Drink at least 64 oz fluid total each daily. 32 oz of that should be plain water.

## 2023-07-30 LAB — URINE CULTURE

## 2023-07-31 ENCOUNTER — Telehealth: Payer: Self-pay | Admitting: *Deleted

## 2023-07-31 ENCOUNTER — Other Ambulatory Visit: Payer: Self-pay | Admitting: Family Medicine

## 2023-07-31 MED ORDER — SULFAMETHOXAZOLE-TRIMETHOPRIM 800-160 MG PO TABS: 1.0000 | ORAL_TABLET | Freq: Two times a day (BID) | ORAL | 0 refills | Status: AC

## 2023-07-31 NOTE — Telephone Encounter (Signed)
 Pt called back after receiving her results and wanted to know how she got a bacteria infection and what can she do to prevent it in the future once this is cleared up?

## 2023-07-31 NOTE — Telephone Encounter (Signed)
 Please let patient know that urinary tract infections are just something that occur in women occasionally even if you do not do anything to cause it.  She can make sure she is adequately hydrated although she will walk to avoid over hydrating given her heart issues..  2 L of fluid a day should be safe.

## 2023-08-01 NOTE — Telephone Encounter (Signed)
 Called pt she is advised of the recommendation

## 2023-08-12 ENCOUNTER — Telehealth: Payer: Self-pay | Admitting: Family Medicine

## 2023-08-12 DIAGNOSIS — F411 Generalized anxiety disorder: Secondary | ICD-10-CM

## 2023-08-12 DIAGNOSIS — F410 Panic disorder [episodic paroxysmal anxiety] without agoraphobia: Secondary | ICD-10-CM

## 2023-08-14 ENCOUNTER — Other Ambulatory Visit: Payer: Self-pay | Admitting: Family Medicine

## 2023-08-14 DIAGNOSIS — I2510 Atherosclerotic heart disease of native coronary artery without angina pectoris: Secondary | ICD-10-CM

## 2023-08-14 DIAGNOSIS — J449 Chronic obstructive pulmonary disease, unspecified: Secondary | ICD-10-CM

## 2023-08-14 MED ORDER — ALBUTEROL SULFATE (2.5 MG/3ML) 0.083% IN NEBU: 2.5000 mg | INHALATION_SOLUTION | Freq: Four times a day (QID) | RESPIRATORY_TRACT | 0 refills | Status: DC | PRN

## 2023-08-14 MED ORDER — METOPROLOL SUCCINATE ER 25 MG PO TB24: 12.5000 mg | ORAL_TABLET | Freq: Every day | ORAL | 1 refills | Status: AC

## 2023-08-14 NOTE — Telephone Encounter (Signed)
 Copied from CRM [id number]. Topic: Clinical - Medication Refill >> Aug 14, 2023  3:25 PM Desma Mcgregor wrote: Most Recent Primary Care Visit:  Provider: Saralyn Pilar A  Department: [facility]  Visit Type: OFFICE VISIT 20  Date: 07/26/2023  Medication: albuterol (PROVENTIL) (2.5 MG/3ML) 0.083% nebulizer solution metoprolol succinate (TOPROL-XL) 25 MG 24 hr tablet  Has the patient contacted their pharmacy? Yes, for the Albuterol, they informed to the patient that its going to refill too soon for 08/28/23 but rx shows no refills. Patient has no more fills on metoprolol. Says it was last filled in 04/2023.   Is this the correct pharmacy for this prescription? Yes If no, delete pharmacy and type the correct one.  This is the patient's preferred pharmacy:  [facility] [address] [place], [place] - [address] [place] [address] Phone: [phone] Fax: [phone]  Has the prescription been filled recently? Yes  Is the patient out of the medication? No, but only has one vial left of albuterol and may needs an ER supply to last until fill date. Patient is good with metoprolol.  Has the patient been seen for an appointment in the last year OR does the patient have an upcoming appointment? Yes  Can we respond through MyChart? No  Agent: Please be advised that Rx refills may take up to 3 business days. We ask that you follow-up with your pharmacy.

## 2023-08-16 NOTE — Telephone Encounter (Signed)
 Tried to contact pt and phone only rang and there was no option to LVM.  If pt returns call needing to ask her about below.

## 2023-08-16 NOTE — Telephone Encounter (Signed)
 Patient was working on tapering off Xanax with [employer].  There are multiple future prescriptions already filed from [facility] that follows a tapering schedule.  Can you ask the patient if she is still following this plan?  And let her know that there should be prescriptions on file already at her pharmacy.

## 2023-08-18 ENCOUNTER — Telehealth: Payer: Self-pay

## 2023-08-18 NOTE — Telephone Encounter (Signed)
 Copied from CRM [id number]. Topic: Clinical - Request for Lab/Test Order >> Aug 17, 2023  3:43 PM Chantha C wrote: Reason for CRM: Patient wanted to verified appointment. Informed patient is for 11/13/23 @ 2 pm with NP, Tammy Parrett. Patient states Dr. Tonia Brooms has her do a lung scan once a year. Should she wait to be seen or can NP, Parrett order the scan.  Please advise and call back [phone]  Spoke with Jehieli. I informed pt lung screening order has been ordered and she should be contacted to schedule soon. Ct lung screening order is in and due around 09/30/23. Has not been scheduled yet. Pt keeping app with TP 6/23. Please advise [facility] to schedule annual lung cancer screening

## 2023-08-21 NOTE — Telephone Encounter (Signed)
 Patient has been scheduled for her appt and is aware

## 2023-09-11 ENCOUNTER — Ambulatory Visit: Payer: 59 | Admitting: Podiatry

## 2023-09-13 ENCOUNTER — Ambulatory Visit: Admitting: Podiatry

## 2023-09-20 ENCOUNTER — Encounter: Payer: Self-pay | Admitting: Podiatry

## 2023-09-20 ENCOUNTER — Ambulatory Visit (INDEPENDENT_AMBULATORY_CARE_PROVIDER_SITE_OTHER): Admitting: Podiatry

## 2023-09-20 DIAGNOSIS — M79674 Pain in right toe(s): Secondary | ICD-10-CM | POA: Diagnosis not present

## 2023-09-20 DIAGNOSIS — M79675 Pain in left toe(s): Secondary | ICD-10-CM | POA: Diagnosis not present

## 2023-09-20 DIAGNOSIS — G629 Polyneuropathy, unspecified: Secondary | ICD-10-CM | POA: Diagnosis not present

## 2023-09-20 DIAGNOSIS — B351 Tinea unguium: Secondary | ICD-10-CM

## 2023-09-26 ENCOUNTER — Other Ambulatory Visit: Payer: Self-pay | Admitting: Family Medicine

## 2023-09-26 DIAGNOSIS — G8929 Other chronic pain: Secondary | ICD-10-CM

## 2023-09-26 NOTE — Progress Notes (Signed)
 Subjective:  Patient ID: Vickie Bowman, female    DOB: August 13, 1950,  MRN: [id number]  Vickie Bowman presents to clinic today for at risk foot care with history of peripheral neuropathy and painful, elongated thickened toenails x 10 which are symptomatic when wearing enclosed shoe gear. This interferes with his/her daily activities.  Chief Complaint  Patient presents with   Nail Problem    "Cut my toenails."   New problem(s): None.   PCP is Noreene Bearded, PA.  Allergies  Allergen Reactions   Penicillins Swelling    Has patient had a PCN reaction causing immediate rash, facial/tongue/throat swelling, SOB or lightheadedness with hypotension: Yes Has patient had a PCN reaction causing severe rash involving mucus membranes or skin necrosis: No Has patient had a PCN reaction that required hospitalization: No Has patient had a PCN reaction occurring within the last 10 years: No If all of the above answers are "NO", then may proceed with Cephalosporin use.     Review of Systems: Negative except as noted in the HPI.  Objective: No changes noted in today's physical examination. There were no vitals filed for this visit. Vickie Bowman is a pleasant 73 y.o. female WD, WN in NAD. AAO x 3. On supplemental oxygen .  Vascular Examination: Capillary refill time immediate b/l. Vascular status intact b/l with palpable pedal pulses. Pedal hair absent b/l. No pain with calf compression b/l. Skin temperature gradient WNL b/l. No cyanosis or clubbing b/l. No ischemia or gangrene noted b/l. No varicosities noted.  Neurological Examination: Sensation grossly intact b/l with 10 gram monofilament. Vibratory sensation intact b/l. Pt has subjective symptoms of neuropathy.  Dermatological Examination: Pedal skin with normal turgor, texture and tone b/l.  No open wounds. No interdigital macerations.   Toenails 1-5 b/l thick, discolored, elongated with subungual debris and pain on dorsal palpation.    Pedal skin is warm and supple b/l LE. No open wounds b/l LE. No interdigital macerations noted b/l LE. Toenails 1-5 b/l elongated, discolored, dystrophic, thickened, crumbly with subungual debris and tenderness to dorsal palpation.  Musculoskeletal Examination: Muscle strength 5/5 to all lower extremity muscle groups bilaterally. No pain, crepitus or joint limitation noted with ROM bilateral LE. HAV with bunion deformity noted b/l LE. Hammertoe(s) right second digit.  Radiographs: None  Last A1c:      Latest Ref Rng & Units 02/15/2023   10:15 AM  Hemoglobin A1C  Hemoglobin-A1c 4.8 - 5.6 % 5.8      Assessment/Plan: 1. Pain due to onychomycosis of toenails of both feet   2. Polyneuropathy     -Patient was evaluated today. All questions/concerns addressed on today's visit. -Patient to continue soft, supportive shoe gear daily. -Mycotic toenails 1-5 bilaterally were debrided in length and girth with sterile nail nippers and dremel. Pinpoint bleeding of L hallux addressed with Lumicain Hemostatic Solution, cleansed with alcohol. Triple antibiotic ointment applied. No further treatment required by patient/caregiver. -Patient/POA to call should there be question/concern in the interim.   Return in about 3 months (around 12/20/2023).  Vickie Bowman, DPM      [facility] LOCATION: [address], [place] [address]  Office [phone]   [facility] LOCATION: [address] [place], [place] [address] Office [phone]

## 2023-09-29 ENCOUNTER — Other Ambulatory Visit: Payer: Self-pay | Admitting: Family Medicine

## 2023-09-29 DIAGNOSIS — J449 Chronic obstructive pulmonary disease, unspecified: Secondary | ICD-10-CM

## 2023-10-02 ENCOUNTER — Ambulatory Visit
Admission: RE | Admit: 2023-10-02 | Discharge: 2023-10-02 | Disposition: A | Discharge status: Home / Self Care | Source: Ambulatory Visit | Attending: Family Medicine

## 2023-10-02 DIAGNOSIS — Z87891 Personal history of nicotine dependence: Secondary | ICD-10-CM

## 2023-10-04 ENCOUNTER — Other Ambulatory Visit: Payer: Self-pay | Admitting: Family Medicine

## 2023-10-04 ENCOUNTER — Telehealth: Payer: Self-pay

## 2023-10-04 NOTE — Telephone Encounter (Signed)
 The patient discussed tapering down the xanax  gradually in December and these prescriptions were written by morgan at that time. She hasn't been prescribed a 1mg  tablet since November of 2024.  Her prescription from 4/14 was 0.5mg  twice a day, and then next one is scheduled to be 0.5mg  am and 0.25mg  in the evening.    If she needs more time at the current dose we can pause her taper where it is now but we are not going back up to 1mg  twice a day dosing.

## 2023-10-04 NOTE — Telephone Encounter (Signed)
 Copied from CRM [id number]. Topic: Clinical - Medication Question >> Oct 04, 2023 11:22 AM Star East wrote: Reason for CRM: Patient requesting ALPRAZolam  (XANAX ) 1 MG tablet, the last prescription was for .50 mg and they do not work as well as the 1 MG, please call [phone]

## 2023-10-04 NOTE — Telephone Encounter (Signed)
 Pt stated that she would like to stay at the current 0.5 dose twice a day and that she would schedule an appt to discuss this medication if she fills she need to. Please send in the refill of that dose she said the .25 would not help her at all. And the 0.5 helps her some.

## 2023-10-05 MED ORDER — ALPRAZOLAM 0.5 MG PO TABS: 0.5000 mg | ORAL_TABLET | Freq: Two times a day (BID) | ORAL | 2 refills | Status: DC | PRN

## 2023-10-05 NOTE — Addendum Note (Signed)
 Addended by: Laneta Pintos on: 10/05/2023 06:47 AM   Modules accepted: Orders

## 2023-10-05 NOTE — Telephone Encounter (Signed)
 Called pt she is advised of her Rx that was sent in, pharmacy is advised of the recommendation of the Rx

## 2023-10-05 NOTE — Telephone Encounter (Signed)
 Please let the patient know that I have sent in a new prescription for 0.5mg  twice a day and please call the pharmacy to tell them to discard all the other prescriptions for xanax  they may have from our office other than the one I have just sent in

## 2023-10-11 NOTE — Telephone Encounter (Signed)
 Copied from CRM [id number]. Topic: General - Other >> Oct 10, 2023  4:56 PM Alverda Joe S wrote: Reason for CRM: patient is calling because she would like to know when was her last pneumonia shot given and if it is time for a new one, also she wants to know if there is final results for her lung cancer screening  Called patient and informed her of last time she her pneumonia vaccine and advised she is overdue for one and informed the results are not back for the lcs scan but the lcs girls will be in contact with her once it gets read and I will route to them as an fyi that patient called in regards to her scan

## 2023-10-17 ENCOUNTER — Other Ambulatory Visit: Payer: Self-pay | Admitting: Family Medicine

## 2023-10-17 DIAGNOSIS — J449 Chronic obstructive pulmonary disease, unspecified: Secondary | ICD-10-CM

## 2023-10-18 ENCOUNTER — Other Ambulatory Visit: Payer: Self-pay

## 2023-10-18 DIAGNOSIS — J449 Chronic obstructive pulmonary disease, unspecified: Secondary | ICD-10-CM

## 2023-10-18 MED ORDER — BREZTRI AEROSPHERE 160-9-4.8 MCG/ACT IN AERO
2.0000 | INHALATION_SPRAY | Freq: Two times a day (BID) | RESPIRATORY_TRACT | 3 refills | Status: DC
Start: 2023-10-18 — End: 2023-10-18

## 2023-10-20 ENCOUNTER — Other Ambulatory Visit: Payer: Self-pay

## 2023-10-20 DIAGNOSIS — G6181 Chronic inflammatory demyelinating polyneuritis: Secondary | ICD-10-CM

## 2023-10-20 DIAGNOSIS — G8929 Other chronic pain: Secondary | ICD-10-CM

## 2023-10-20 NOTE — Telephone Encounter (Signed)
 Copied from CRM [id number]. Topic: Clinical - Medication Refill >> Oct 20, 2023  2:08 PM Rachelle R wrote: Medication: HYDROcodone -acetaminophen  (NORCO/VICODIN) 5-325 MG tablet  Has the patient contacted their pharmacy? Yes, Call Dr  This is the patient's preferred pharmacy:  [facility] [address] [place], [place] - [address] [place] [address] Phone: [phone] Fax: [phone]  Is this the correct pharmacy for this prescription? Yes If no, delete pharmacy and type the correct one.   Has the prescription been filled recently? No  Is the patient out of the medication? No, 4 left  Has the patient been seen for an appointment in the last year OR does the patient have an upcoming appointment? Yes  Can we respond through MyChart? No  Agent: Please be advised that Rx refills may take up to 3 business days. We ask that you follow-up with your pharmacy.

## 2023-10-23 MED ORDER — HYDROCODONE-ACETAMINOPHEN 5-325 MG PO TABS: 1.0000 | ORAL_TABLET | Freq: Two times a day (BID) | ORAL | 0 refills | Status: DC | PRN

## 2023-10-23 NOTE — Telephone Encounter (Signed)
 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 PDMP reviewed. No aberrancies. Refill appropriate.

## 2023-10-26 ENCOUNTER — Other Ambulatory Visit: Payer: Self-pay

## 2023-10-26 DIAGNOSIS — Z87891 Personal history of nicotine dependence: Secondary | ICD-10-CM

## 2023-10-26 DIAGNOSIS — Z122 Encounter for screening for malignant neoplasm of respiratory organs: Secondary | ICD-10-CM

## 2023-11-02 ENCOUNTER — Ambulatory Visit

## 2023-11-02 DIAGNOSIS — Z0000 Encounter for general adult medical examination without abnormal findings: Secondary | ICD-10-CM | POA: Diagnosis not present

## 2023-11-02 NOTE — Progress Notes (Signed)
 Subjective:   Satsuki J Adney is a 73 y.o. who presents for a Medicare Wellness preventive visit.  As a reminder, Annual Wellness Visits don't include a physical exam, and some assessments may be limited, especially if this visit is performed virtually. We may recommend an in-person follow-up visit with your provider if needed.  Visit Complete: Virtual I connected with  Dannah J Carandang on 11/02/23 by a audio enabled telemedicine application and verified that I am speaking with the correct person using two identifiers.  Patient Location: Home  Provider Location: Home Office  I discussed the limitations of evaluation and management by telemedicine. The patient expressed understanding and agreed to proceed.  Vital Signs: Because this visit was a virtual/telehealth visit, some criteria may be missing or patient reported. Any vitals not documented were not able to be obtained and vitals that have been documented are patient reported.  VideoError- Librarian, academic were attempted between this provider and patient, however failed, due to patient having technical difficulties OR patient did not have access to video capability.  We continued and completed visit with audio only.   Persons Participating in Visit: Patient.  AWV Questionnaire: No: Patient Medicare AWV questionnaire was not completed prior to this visit.  Cardiac Risk Factors include: advanced age (>52men, >34 women);dyslipidemia     Objective:    Today's Vitals   There is no height or weight on file to calculate BMI.     11/02/2023   11:38 AM 10/27/2022    1:17 PM 03/05/2018    2:07 AM 03/04/2018    9:43 PM 02/01/2018   10:58 PM  Advanced Directives  Does Patient Have a Medical Advance Directive? Yes Yes No  No  No   Type of Advance Directive Out of facility DNR (pink MOST or yellow form) Healthcare Power of [place];Living will     Does patient want to make changes to medical advance  directive?  No - Patient declined     Copy of Healthcare Power of Attorney in Chart?  Yes - validated most recent copy scanned in chart (See row information)     Would patient like information on creating a medical advance directive?   No - Patient declined  No - Patient declined  No - Patient declined      Data saved with a previous flowsheet row definition    Current Medications (verified) Outpatient Encounter Medications as of 11/02/2023  Medication Sig   albuterol  (PROVENTIL ) (2.5 MG/3ML) 0.083% nebulizer solution USE 1 VIAL IN NEBULIZER EVERY 6 HOURS AS NEEDED FOR WHEEZING FOR SHORTNESS OF BREATH   albuterol  (VENTOLIN  HFA) 108 (90 Base) MCG/ACT inhaler Inhale 2 puffs into the lungs every 6 (six) hours as needed for wheezing or shortness of breath.   ALPRAZolam  (XANAX ) 0.5 MG tablet Take 1 tablet (0.5 mg total) by mouth 2 (two) times daily as needed for anxiety.   aspirin  EC 81 MG EC tablet Take 1 tablet (81 mg total) by mouth daily.   BREZTRI  AEROSPHERE 160-9-4.8 MCG/ACT AERO inhaler INHALE 2 PUFFS IN THE MORNING AND AT BEDTIME   Cholecalciferol (VITAMIN D ) 2000 units CAPS Take 1 capsule (2,000 Units total) by mouth daily.   Dextromethorphan  HBr 15 MG CAPS Take 2 capsules (30 mg total) by mouth 3 (three) times daily as needed (cough).   DULoxetine  (CYMBALTA ) 30 MG capsule Take 1 capsule (30 mg total) by mouth daily.   DULoxetine  (CYMBALTA ) 60 MG capsule Take 1 capsule (60 mg total) by  mouth daily.   HYDROcodone -acetaminophen  (NORCO/VICODIN) 5-325 MG tablet Take 1 tablet by mouth 2 (two) times daily as needed for severe pain (pain score 7-10).   ipratropium-albuterol  (DUONEB) 0.5-2.5 (3) MG/3ML SOLN Take 3 mLs by nebulization every 6 (six) hours as needed.   ketoconazole  (NIZORAL ) 2 % cream Apply 1 Application topically daily.   meloxicam  (MOBIC ) 7.5 MG tablet Take 1 tablet by mouth once daily   metoprolol  succinate (TOPROL -XL) 25 MG 24 hr tablet Take 0.5 tablets (12.5 mg total) by mouth  daily.   pregabalin  (LYRICA ) 25 MG capsule Take 1 capsule (25 mg total) by mouth 2 (two) times daily.   rosuvastatin  (CRESTOR ) 20 MG tablet Take 1 tablet by mouth once daily   Spacer/Aero-Holding Chambers (AEROCHAMBER MV) inhaler Use as instructed   diclofenac  Sodium (VOLTAREN ) 1 % GEL Apply 2 g topically 4 (four) times daily. To affected joint. (Patient not taking: Reported on 11/02/2023)   No facility-administered encounter medications on file as of 11/02/2023.    Allergies (verified) Penicillins   History: Past Medical History:  Diagnosis Date   Anxiety    CHF (congestive heart failure) (HCC)    COPD (chronic obstructive pulmonary disease) (HCC)    Lumbar herniated disc    Tobacco abuse    Past Surgical History:  Procedure Laterality Date   LEFT HEART CATH AND CORONARY ANGIOGRAPHY N/A 02/05/2018   Procedure: LEFT HEART CATH AND CORONARY ANGIOGRAPHY;  Surgeon: Sammy Crisp, MD;  Location: [facility];  Service: Cardiovascular;  Laterality: N/A;   Family History  Problem Relation Age of Onset   Liver cancer Mother    Breast cancer Sister    CAD Neg Hx    Social History   Socioeconomic History   Marital status: Widowed    Spouse name: Not on file   Number of children: Not on file   Years of education: Not on file   Highest education level: Not on file  Occupational History   Not on file  Tobacco Use   Smoking status: Former    Current packs/day: 0.00    Average packs/day: 1.5 packs/day for 53.0 years (79.5 ttl pk-yrs)    Types: Cigarettes    Start date: 05/24/1967    Quit date: 01/07/2018    Years since quitting: 5.8    Passive exposure: Current   Smokeless tobacco: Never   Tobacco comments:    Smoked since age 68, quit 12/2017  Vaping Use   Vaping status: Never Used  Substance and Sexual Activity   Alcohol use: Not Currently    Comment: Rare, glass of wine no more than every few weeks   Drug use: Yes    Types: Hydrocodone    Sexual activity: Not  Currently  Other Topics Concern   Not on file  Social History Narrative   Right handed   Lives with brother   Caffeine 1 cup coffee   Social Drivers of Corporate investment banker Strain: Low Risk  (11/02/2023)   Overall Financial Resource Strain (CARDIA)    Difficulty of Paying Living Expenses: Not hard at all  Food Insecurity: No Food Insecurity (11/02/2023)   Hunger Vital Sign    Worried About Running Out of Food in the Last Year: Never true    Ran Out of Food in the Last Year: Never true  Transportation Needs: No Transportation Needs (11/02/2023)   PRAPARE - Administrator, Civil Service (Medical): No    Lack of Transportation (Non-Medical): No  Physical Activity: Inactive (11/02/2023)   Exercise Vital Sign    Days of Exercise per Week: 0 days    Minutes of Exercise per Session: 0 min  Stress: Stress Concern Present (11/02/2023)   [employer] of Occupational Health - Occupational Stress Questionnaire    Feeling of Stress: To some extent  Social Connections: Socially Isolated (11/02/2023)   Social Connection and Isolation Panel    Frequency of Communication with Friends and Family: More than three times a week    Frequency of Social Gatherings with Friends and Family: More than three times a week    Attends Religious Services: Never    Database administrator or Organizations: No    Attends Banker Meetings: Never    Marital Status: Widowed    Tobacco Counseling Counseling given: Not Answered Tobacco comments: Smoked since age 42, quit 12/2017    Clinical Intake:  Pre-visit preparation completed: Yes  Pain : No/denies pain     Nutritional Risks: None Diabetes: No  Lab Results  Component Value Date   HGBA1C 5.8 (H) 02/15/2023   HGBA1C 5.7 (H) 04/20/2021     How often do you need to have someone help you when you read instructions, pamphlets, or other written materials from your doctor or pharmacy?: 1 - Never  Interpreter Needed?:  No  Information entered by :: NAllen LPN   Activities of Daily Living     11/02/2023   11:28 AM  In your present state of health, do you have any difficulty performing the following activities:  Hearing? 0  Vision? 0  Difficulty concentrating or making decisions? 0  Walking or climbing stairs? 1  Comment due to breathing  Dressing or bathing? 0  Doing errands, shopping? 0  Preparing Food and eating ? N  Using the Toilet? N  In the past six months, have you accidently leaked urine? N  Do you have problems with loss of bowel control? N  Managing your Medications? N  Managing your Finances? N  Housekeeping or managing your Housekeeping? N    Patient Care Team: Melene Sportsman as PCP - General (Physician Assistant) Hazle Lites, MD as PCP - Cardiology (Cardiology)  I have updated your Care Teams any recent Medical Services you may have received from other providers in the past year.     Assessment:   This is a routine wellness examination for Angelamarie.  Hearing/Vision screen Hearing Screening - Comments:: Denies hearing aids Vision Screening - Comments:: No regular eye exams, Dr. Terrall Ferraris   Goals Addressed             This Visit's Progress    Patient Stated       11/02/2023, breath better       Depression Screen     11/02/2023   11:41 AM 07/26/2023    1:47 PM 07/03/2023    2:03 PM 04/27/2023    1:19 PM 01/26/2023    2:28 PM 10/27/2022    1:12 PM 10/26/2022    1:41 PM  PHQ 2/9 Scores  PHQ - 2 Score 0 0 0 0 0 0 0  PHQ- 9 Score 0 0 0 0 0 0 0    Fall Risk     11/02/2023   11:39 AM 07/26/2023    1:47 PM 10/27/2022    1:15 PM 06/22/2022    2:57 PM  Fall Risk   Falls in the past year? 0 0 0 1  Number falls in past yr:  0 0 0 1  Injury with Fall? 0 0 0 1  Risk for fall due to : Medication side effect No Fall Risks No Fall Risks   Follow up Falls evaluation completed;Falls prevention discussed Falls evaluation completed Falls prevention discussed Falls evaluation  completed    MEDICARE RISK AT HOME:  Medicare Risk at Home Any stairs in or around the home?: No If so, are there any without handrails?: No Home free of loose throw rugs in walkways, pet beds, electrical cords, etc?: Yes Adequate lighting in your home to reduce risk of falls?: Yes Life alert?: No Use of a cane, walker or w/c?: No Grab bars in the bathroom?: No Shower chair or bench in shower?: Yes Elevated toilet seat or a handicapped toilet?: No  TIMED UP AND GO:  Was the test performed?  No  Cognitive Function: 6CIT completed        11/02/2023   11:41 AM 10/27/2022    1:17 PM 11/09/2021    1:37 PM  6CIT Screen  What Year? 0 points 0 points 0 points  What month? 0 points 0 points 0 points  What time? 0 points 0 points 0 points  Count back from 20 0 points 0 points 0 points  Months in reverse 0 points 0 points 0 points  Repeat phrase 0 points 0 points 2 points  Total Score 0 points 0 points 2 points    Immunizations Immunization History  Administered Date(s) Administered   Fluad Quad(high Dose 65+) 02/22/2019, 03/19/2021, 02/15/2022   Influenza, High Dose Seasonal PF 02/08/2023   Influenza-Unspecified 03/21/2018   PFIZER(Purple Top)SARS-COV-2 Vaccination 11/23/2019, 11/30/2019, 12/21/2019   Pneumococcal Polysaccharide-23 06/21/2018   Tdap 11/24/2021   Zoster Recombinant(Shingrix) 10/23/2023    Screening Tests Health Maintenance  Topic Date Due   Pneumococcal Vaccine: 50+ Years (2 of 2 - PCV) 06/22/2019   COVID-19 Vaccine (4 - 2024-25 season) 01/22/2023   Zoster Vaccines- Shingrix (2 of 2) 12/18/2023   INFLUENZA VACCINE  12/22/2023   Lung Cancer Screening  10/01/2024   Medicare Annual Wellness (AWV)  11/01/2024   Fecal DNA (Cologuard)  11/05/2024   DTaP/Tdap/Td (2 - Td or Tdap) 11/25/2031   DEXA SCAN  Completed   Hepatitis C Screening  Completed   HPV VACCINES  Aged Out   Meningococcal B Vaccine  Aged Out    Health Maintenance  Health Maintenance Due   Topic Date Due   Pneumococcal Vaccine: 50+ Years (2 of 2 - PCV) 06/22/2019   COVID-19 Vaccine (4 - 2024-25 season) 01/22/2023   Health Maintenance Items Addressed: Due for covid and pneumonia vaccines.  Additional Screening:  Vision Screening: Recommended annual ophthalmology exams for early detection of glaucoma and other disorders of the eye. Would you like a referral to an eye doctor? No    Dental Screening: Recommended annual dental exams for proper oral hygiene  Community Resource Referral / Chronic Care Management: CRR required this visit?  No   CCM required this visit?  No   Plan:    I have personally reviewed and noted the following in the patient's chart:   Medical and social history Use of alcohol, tobacco or illicit drugs  Current medications and supplements including opioid prescriptions. Patient is currently taking opioid prescriptions. Information provided to patient regarding non-opioid alternatives. Patient advised to discuss non-opioid treatment plan with their provider. Functional ability and status Nutritional status Physical activity Advanced directives List of other physicians Hospitalizations, surgeries, and ER visits in previous 12 months Vitals  Screenings to include cognitive, depression, and falls Referrals and appointments  In addition, I have reviewed and discussed with patient certain preventive protocols, quality metrics, and best practice recommendations. A written personalized care plan for preventive services as well as general preventive health recommendations were provided to patient.   Areatha Beecham, LPN   1/61/0960   After Visit Summary: (Pick Up) Due to this being a telephonic visit, with patients personalized plan was offered to patient and patient has requested to Pick up at office.  Notes: Nothing significant to report at this time.

## 2023-11-02 NOTE — Patient Instructions (Signed)
 Vickie Bowman , Thank you for taking time out of your busy schedule to complete your Annual Wellness Visit with me. I enjoyed our conversation and look forward to speaking with you again next year. I, as well as your care team,  appreciate your ongoing commitment to your health goals. Please review the following plan we discussed and let me know if I can assist you in the future. Your Game plan/ To Do List    Referrals: If you haven't heard from the office you've been referred to, please reach out to them at the phone provided.  N/a Follow up Visits: Next Medicare AWV with our clinical staff: 12/12/2024 at 11:30   Have you seen your provider in the last 6 months (3 months if uncontrolled diabetes)? Yes Next Office Visit with your provider: 11/06/2023 at 1:50  Clinician Recommendations:  Aim for 30 minutes of exercise or brisk walking, 6-8 glasses of water, and 5 servings of fruits and vegetables each day.       This is a list of the screening recommended for you and due dates:  Health Maintenance  Topic Date Due   Pneumococcal Vaccine for age over 46 (2 of 2 - PCV) 06/22/2019   COVID-19 Vaccine (4 - 2024-25 season) 01/22/2023   Zoster (Shingles) Vaccine (2 of 2) 12/18/2023   Flu Shot  12/22/2023   Screening for Lung Cancer  10/01/2024   Medicare Annual Wellness Visit  11/01/2024   Cologuard (Stool DNA test)  11/05/2024   DTaP/Tdap/Td vaccine (2 - Td or Tdap) 11/25/2031   DEXA scan (bone density measurement)  Completed   Hepatitis C Screening  Completed   HPV Vaccine  Aged Out   Meningitis B Vaccine  Aged Out    Advanced directives: (ACP Link)Information on Advanced Care Planning can be found at [place]  Secretary of [facility] Advance Health Care Directives Advance Health Care Directives. [email or web address]  Advance Care Planning is important because it:  [x]  Makes sure you receive the medical care that is consistent with your values, goals, and preferences  [x]  It provides guidance to your  family and loved ones and reduces their decisional burden about whether or not they are making the right decisions based on your wishes.  Follow the link provided in your after visit summary or read over the paperwork we have mailed to you to help you started getting your Advance Directives in place. If you need assistance in completing these, please reach out to us  so that we can help you!  See attachments for Preventive Care and Fall Prevention Tips.

## 2023-11-06 ENCOUNTER — Ambulatory Visit (INDEPENDENT_AMBULATORY_CARE_PROVIDER_SITE_OTHER)

## 2023-11-06 VITALS — BP 121/78 | HR 56 | Ht 68.0 in | Wt 139.0 lb

## 2023-11-06 DIAGNOSIS — M5442 Lumbago with sciatica, left side: Secondary | ICD-10-CM | POA: Diagnosis not present

## 2023-11-06 DIAGNOSIS — F411 Generalized anxiety disorder: Secondary | ICD-10-CM | POA: Diagnosis not present

## 2023-11-06 DIAGNOSIS — M5441 Lumbago with sciatica, right side: Secondary | ICD-10-CM

## 2023-11-06 DIAGNOSIS — G8929 Other chronic pain: Secondary | ICD-10-CM

## 2023-11-06 MED ORDER — BUSPIRONE HCL 5 MG PO TABS: 5.0000 mg | ORAL_TABLET | Freq: Two times a day (BID) | ORAL | 1 refills | Status: DC

## 2023-11-06 NOTE — Assessment & Plan Note (Signed)
 Working to taper off of Xanax  given that taking hydrocodone  increases her risk of falls, delirium, and has potential interactions with Xanax  and pregabalin .  Her Norco was refilled a few weeks ago, therefore not due for refill today. PDMP reviewed, no aberrancies.  Overdose risk score 050.  Will continue to monitor.

## 2023-11-06 NOTE — Progress Notes (Signed)
 Established Patient Office Visit  Subjective   Patient ID: Vickie Bowman, female    DOB: 06-22-1950  Age: 73 y.o. MRN: [id number]  Chief Complaint  Patient presents with   Back Pain    ONSET:  MEDS TAKEN:     HPI  Vickie Bowman is a 73 y.o.  presenting today for follow up of back pain management, mood. In Feb 2025, started taper schedule for Xanax  (see below).  She reports that she was doing well with the taper, however she has been taking 1-2 of her 0.5 mg pills recently due to increased anxiety/panic attacks. When previously she was only doing 0.5 mg in the AM and 0.25 mg nightly.   Patient also takes Norco 5-325 as needed for chronic back pain. Reports that she has not had to take any in several weeks as her symptoms have not been bothering her.  She continues to follow with neurology and take Cymbalta  and pregabalin  for back pain  Xanax  Taper started 07/03/2023: Weeks 1 to 4: Xanax  0.75 mg twice daily: Work towards only taking 1.5 tablets (0.75 mg) each morning and 0.75 mg each evening.  If you start to experience withdrawal symptoms like racing heart, GI upset, stay at the last comfortable dosing schedule for an additional 5-7 days. Weeks 5 to 8: Xanax  1.5 tablets (0.75 mg) each morning and 1 tablet (0.5 mg) each evening. Weeks 9 to 12: Xanax  1 tablet (0.5 mg) each morning and 1 tablet (0.5 mg) each evening. Weeks 13 to 15: Xanax  1 tablet (0.5 mg) each morning and 0.5 tablet (0.25 mg) each evening.   Weeks 16 to 18: Xanax  0.5 tablet (0.25 mg) each morning and 0.5 tablet (0.25 mg) each evening. Weeks 19 to 21: Xanax  0.5 tablet (0.25) mg each morning Weeks 22+: only take Xanax  as needed for panic attacks     ROS Per HPI.    Objective:     BP 121/78   Pulse (!) 56   Ht 5' 8 (1.727 m)   Wt 139 lb (63 kg)   SpO2 96%   BMI 21.13 kg/m    Physical Exam Constitutional:      General: She is not in acute distress.    Appearance: Normal appearance.    Cardiovascular:     Rate and Rhythm: Normal rate and regular rhythm.     Heart sounds: Normal heart sounds. No murmur heard.    No friction rub. No gallop.  Pulmonary:     Effort: Pulmonary effort is normal. No respiratory distress.     Breath sounds: Normal breath sounds.   Musculoskeletal:        General: No swelling.   Skin:    General: Skin is warm and dry.   Neurological:     General: No focal deficit present.     Mental Status: She is alert.   Psychiatric:        Mood and Affect: Mood normal.        Behavior: Behavior normal.        Thought Content: Thought content normal.     No results found for any visits on 11/06/23.    The ASCVD Risk score (Arnett DK, et al., 2019) failed to calculate for the following reasons:   Risk score cannot be calculated because patient has a medical history suggesting prior/existing ASCVD    Assessment & Plan:   Generalized anxiety disorder Assessment & Plan: Given that anxiety/panic attacks worsening, will leave Xanax  at the  last prescribed dose for taper. Start BuSpar 5 mg BID for prevention of anxiety attacks. Instructed patient to take this medication daily and try to reserve Xanax  for breakthrough only. PDMP reviewed, no aberrancies. Will follow up on mood in 8 weeks to discuss toleration of Buspar/titration of dosage.   Chronic midline low back pain with bilateral sciatica Assessment & Plan: Working to taper off of Xanax  given that taking hydrocodone  increases her risk of falls, delirium, and has potential interactions with Xanax  and pregabalin .  Her Norco was refilled a few weeks ago, therefore not due for refill today. PDMP reviewed, no aberrancies.  Overdose risk score 050.  Will continue to monitor.    Other orders -     busPIRone HCl; Take 1 tablet (5 mg total) by mouth 2 (two) times daily.  Dispense: 90 tablet; Refill: 1   Return in about 8 weeks (around 01/01/2024) for Mood.    Odilia Bennett, PA-C

## 2023-11-06 NOTE — Patient Instructions (Addendum)
 It was nice to see you today!  As we discussed in clinic:   - I have sent in Buspar 5 mg to take twice daily every day to prevent anxiety attacks. You may use the Xanax  as needed for break through panic attacks if needed.  -Let's plan to follow up on mood in 8 weeks and see how you are doing on the Buspar and discuss if dose needs to be increased.  If you have any problems before your next visit feel free to message me via MyChart (minor issues or questions) or call the office, otherwise you may reach out to schedule an office visit.  Thank you! Meryl Acosta, PA-C

## 2023-11-06 NOTE — Assessment & Plan Note (Signed)
 Given that anxiety/panic attacks worsening, will leave Xanax  at the last prescribed dose for taper. Start BuSpar 5 mg BID for prevention of anxiety attacks. Instructed patient to take this medication daily and try to reserve Xanax  for breakthrough only. PDMP reviewed, no aberrancies. Will follow up on mood in 8 weeks to discuss toleration of Buspar/titration of dosage.

## 2023-11-07 ENCOUNTER — Telehealth (HOSPITAL_BASED_OUTPATIENT_CLINIC_OR_DEPARTMENT_OTHER): Payer: Self-pay

## 2023-11-07 NOTE — Telephone Encounter (Signed)
 Copied from CRM [id number]. Topic: Clinical - Lab/Test Results >> Nov 07, 2023  4:53 PM Eveleen Hinds B wrote: Reason for CRM: Patient calling, she had lung screening a couple months ago and never received call to go over results. Please call patient.

## 2023-11-09 ENCOUNTER — Telehealth: Payer: Self-pay

## 2023-11-09 NOTE — Telephone Encounter (Signed)
 Copied from CRM [id number]. Topic: Clinical - Medication Question >> Nov 09, 2023 11:52 AM Sanjuana Crutch wrote: Reason for CRM: patient wants to know if she can take busPIRone (BUSPAR) 5 MG tablet and her mucus relief medication with all the other medication she has . Says she needs to know urgently because she's not sure what the provider wanted her to do

## 2023-11-10 NOTE — Telephone Encounter (Signed)
 Spoke with patient can reviewed lung screening CT results. Small nodules seen remain stable. Emphysema related to past smoking history. Atherosclerosis noted. Patient is on Crestor  daily. Pt verbalized understanding and is aware we will contact her closer to 12 months to schedule yearly scan.

## 2023-11-13 ENCOUNTER — Ambulatory Visit (INDEPENDENT_AMBULATORY_CARE_PROVIDER_SITE_OTHER): Payer: 59 | Admitting: Adult Health

## 2023-11-13 ENCOUNTER — Encounter: Payer: Self-pay | Admitting: Adult Health

## 2023-11-13 VITALS — BP 102/60 | HR 75 | Temp 97.7°F | Ht 68.0 in | Wt 137.4 lb

## 2023-11-13 DIAGNOSIS — J9611 Chronic respiratory failure with hypoxia: Secondary | ICD-10-CM

## 2023-11-13 DIAGNOSIS — J449 Chronic obstructive pulmonary disease, unspecified: Secondary | ICD-10-CM

## 2023-11-13 DIAGNOSIS — E43 Unspecified severe protein-calorie malnutrition: Secondary | ICD-10-CM | POA: Diagnosis not present

## 2023-11-13 MED ORDER — OHTUVAYRE 3 MG/2.5ML IN SUSP: 1.0000 | Freq: Two times a day (BID) | RESPIRATORY_TRACT | Status: AC

## 2023-11-13 NOTE — Progress Notes (Unsigned)
 @Patient  ID: Vickie Bowman, female    DOB: 12/11/1950, 73 y.o.   MRN: [id number]  Chief Complaint  Patient presents with   Follow-up    Referring provider: Wallace Joesph LABOR, PA  HPI: 73 year old female former smoker followed for COPD with emphysema, chronic hypoxic respiratory failure Participation lung cancer CT screening program  TEST/EVENTS :  CT chest-low-dose Oct 02, 2023 lung RADS 2 with benign appearance, severe emphysema stable, stable  complete collapse of the right middle lobe, Stable 1.1 mm left upper lobe nodule.  11/13/2023 Follow up: COPD with Emphysema, O2 RF , Lung nodules  Discussed the use of AI scribe software for clinical note transcription with the patient, who gave verbal consent to proceed.  History of Present Illness   Vickie Bowman is a 73 year old female with COPD and emphysema who presents for a routine checkup.  Her breathing has been relatively stable with slight worsening, experiencing variability with some days better than others. She is on 2 liters of oxygen  and uses a Breztri  inhaler twice daily. A recent CT scan from last month showed stable emphysema and nodules. She has not been hospitalized frequently for COPD since her initial diagnosis six years ago. She quit smoking six years ago.  She experiences a persistent cough, which she attributes to her COPD, and uses over-the-counter medications like Mucinex  or Robitussin to help with mucus. She also takes Xanax  for anxiety, one in the morning and one at night, and has been prescribed buspirone, though she is unsure of its purpose.  She mentions her legs being 'scaly' and having visible veins. She has not taken any fluid pills.  She received a pneumonia shot in 2020 and a shingles vaccine recently. She is due for another shingles shot in August and is considering an RSV vaccine in the fall. Her tetanus shot was administered less than two years ago.        Allergies  Allergen Reactions    Penicillins Swelling    Has patient had a PCN reaction causing immediate rash, facial/tongue/throat swelling, SOB or lightheadedness with hypotension: Yes Has patient had a PCN reaction causing severe rash involving mucus membranes or skin necrosis: No Has patient had a PCN reaction that required hospitalization: No Has patient had a PCN reaction occurring within the last 10 years: No If all of the above answers are NO, then may proceed with Cephalosporin use.     Immunization History  Administered Date(s) Administered   Fluad Quad(high Dose 65+) 02/22/2019, 03/19/2021, 02/15/2022   Influenza, High Dose Seasonal PF 02/08/2023   Influenza-Unspecified 03/21/2018   PFIZER(Purple Top)SARS-COV-2 Vaccination 11/23/2019, 11/30/2019, 12/21/2019   Pneumococcal Polysaccharide-23 06/21/2018   Tdap 11/24/2021   Zoster Recombinant(Shingrix) 10/23/2023    Past Medical History:  Diagnosis Date   Anxiety    CHF (congestive heart failure) (HCC)    COPD (chronic obstructive pulmonary disease) (HCC)    Lumbar herniated disc    Tobacco abuse     Tobacco History: Social History   Tobacco Use  Smoking Status Former   Current packs/day: 0.00   Average packs/day: 1.5 packs/day for 53.0 years (79.5 ttl pk-yrs)   Types: Cigarettes   Start date: 05/24/1967   Quit date: 01/07/2018   Years since quitting: 5.8   Passive exposure: Current  Smokeless Tobacco Never  Tobacco Comments   Smoked since age 65, quit 12/2017   Counseling given: Not Answered Tobacco comments: Smoked since age 26, quit 12/2017   Outpatient Medications Prior to  Visit  Medication Sig Dispense Refill   albuterol  (PROVENTIL ) (2.5 MG/3ML) 0.083% nebulizer solution USE 1 VIAL IN NEBULIZER EVERY 6 HOURS AS NEEDED FOR WHEEZING FOR SHORTNESS OF BREATH 90 mL 1   albuterol  (VENTOLIN  HFA) 108 (90 Base) MCG/ACT inhaler Inhale 2 puffs into the lungs every 6 (six) hours as needed for wheezing or shortness of breath. 8 g 6   ALPRAZolam   (XANAX ) 0.5 MG tablet Take 1 tablet (0.5 mg total) by mouth 2 (two) times daily as needed for anxiety. 60 tablet 2   aspirin  EC 81 MG EC tablet Take 1 tablet (81 mg total) by mouth daily. 30 tablet 0   BREZTRI  AEROSPHERE 160-9-4.8 MCG/ACT AERO inhaler INHALE 2 PUFFS IN THE MORNING AND AT BEDTIME 11 g 0   busPIRone (BUSPAR) 5 MG tablet Take 1 tablet (5 mg total) by mouth 2 (two) times daily. 90 tablet 1   Cholecalciferol (VITAMIN D ) 2000 units CAPS Take 1 capsule (2,000 Units total) by mouth daily. 30 capsule 5   Dextromethorphan  HBr 15 MG CAPS Take 2 capsules (30 mg total) by mouth 3 (three) times daily as needed (cough). 28 capsule 0   diclofenac  Sodium (VOLTAREN ) 1 % GEL Apply 2 g topically 4 (four) times daily. To affected joint. 100 g 11   DULoxetine  (CYMBALTA ) 30 MG capsule Take 1 capsule (30 mg total) by mouth daily. 90 capsule 3   DULoxetine  (CYMBALTA ) 60 MG capsule Take 1 capsule (60 mg total) by mouth daily. 90 capsule 3   HYDROcodone -acetaminophen  (NORCO/VICODIN) 5-325 MG tablet Take 1 tablet by mouth 2 (two) times daily as needed for severe pain (pain score 7-10). 30 tablet 0   ipratropium-albuterol  (DUONEB) 0.5-2.5 (3) MG/3ML SOLN Take 3 mLs by nebulization every 6 (six) hours as needed. 90 mL 3   ketoconazole  (NIZORAL ) 2 % cream Apply 1 Application topically daily. 60 g 2   meloxicam  (MOBIC ) 7.5 MG tablet Take 1 tablet by mouth once daily 90 tablet 0   metoprolol  succinate (TOPROL -XL) 25 MG 24 hr tablet Take 0.5 tablets (12.5 mg total) by mouth daily. 90 tablet 1   pregabalin  (LYRICA ) 25 MG capsule Take 1 capsule (25 mg total) by mouth 2 (two) times daily. 60 capsule 5   rosuvastatin  (CRESTOR ) 20 MG tablet Take 1 tablet by mouth once daily 90 tablet 3   Spacer/Aero-Holding Chambers (AEROCHAMBER MV) inhaler Use as instructed 1 each 0   No facility-administered medications prior to visit.     Review of Systems:   Constitutional:   No  weight loss, night sweats,  Fevers, chills,  fatigue, or  lassitude.  HEENT:   No headaches,  Difficulty swallowing,  Tooth/dental problems, or  Sore throat,                No sneezing, itching, ear ache, nasal congestion, post nasal drip,   CV:  No chest pain,  Orthopnea, PND, swelling in lower extremities, anasarca, dizziness, palpitations, syncope.   GI  No heartburn, indigestion, abdominal pain, nausea, vomiting, diarrhea, change in bowel habits, loss of appetite, bloody stools.   Resp: No shortness of breath with exertion or at rest.  No excess mucus, no productive cough,  No non-productive cough,  No coughing up of blood.  No change in color of mucus.  No wheezing.  No chest wall deformity  Skin: no rash or lesions.  GU: no dysuria, change in color of urine, no urgency or frequency.  No flank pain, no hematuria   MS:  No joint pain or swelling.  No decreased range of motion.  No back pain.    Physical Exam  BP 102/60 (BP Location: Left Arm, Patient Position: Sitting, Cuff Size: Normal)   Pulse 75   Temp 97.7 F (36.5 C) (Oral)   Ht 5' 8 (1.727 m)   Wt 137 lb 6.4 oz (62.3 kg)   SpO2 96%   BMI 20.89 kg/m   GEN: A/Ox3; pleasant , NAD, well nourished    HEENT:  [place]/AT,  EACs-clear, TMs-wnl, NOSE-clear, THROAT-clear, no lesions, no postnasal drip or exudate noted.   NECK:  Supple w/ fair ROM; no JVD; normal carotid impulses w/o bruits; no thyromegaly or nodules palpated; no lymphadenopathy.    RESP  Clear  P & A; w/o, wheezes/ rales/ or rhonchi. no accessory muscle use, no dullness to percussion  CARD:  RRR, no m/r/g, no peripheral edema, pulses intact, no cyanosis or clubbing.  GI:   Soft & nt; nml bowel sounds; no organomegaly or masses detected.   Musco: Warm bil, no deformities or joint swelling noted.   Neuro: alert, no focal deficits noted.    Skin: Warm, no lesions or rashes    Lab Results:  CBC    Component Value Date/Time   WBC 6.4 02/15/2023 1015   WBC 5.7 03/05/2018 0327   RBC 4.49  02/15/2023 1015   RBC 4.61 03/05/2018 0327   HGB 13.7 02/15/2023 1015   HCT 42.3 02/15/2023 1015   PLT 165 02/15/2023 1015   MCV 94 02/15/2023 1015   MCH 30.5 02/15/2023 1015   MCH 30.4 03/05/2018 0327   MCHC 32.4 02/15/2023 1015   MCHC 31.5 03/05/2018 0327   RDW 12.8 02/15/2023 1015   LYMPHSABS 1.3 02/15/2023 1015   MONOABS 0.7 03/04/2018 2146   EOSABS 0.2 02/15/2023 1015   BASOSABS 0.1 02/15/2023 1015    BMET    Component Value Date/Time   NA 144 02/15/2023 1015   K 4.3 02/15/2023 1015   CL 104 02/15/2023 1015   CO2 26 02/15/2023 1015   GLUCOSE 91 02/15/2023 1015   GLUCOSE 154 (H) 03/05/2018 0327   BUN 18 02/15/2023 1015   CREATININE 0.81 02/15/2023 1015   CALCIUM  9.2 02/15/2023 1015   GFRNONAA 69 07/01/2020 1430   GFRAA 79 07/01/2020 1430    BNP    Component Value Date/Time   BNP 41.8 07/01/2020 1430   BNP 12.8 03/04/2018 2146    ProBNP No results found for: PROBNP  Imaging: No results found.  Administration History     None          Latest Ref Rng & Units 03/30/2018    3:51 PM  PFT Results  FVC-Pre L 2.11   FVC-Predicted Pre % 100   FVC-Post L 2.31   FVC-Predicted Post % 109   Pre FEV1/FVC % % 41   Post FEV1/FCV % % 48   FEV1-Pre L 0.87   FEV1-Predicted Pre % 55   FEV1-Post L 1.10   DLCO uncorrected ml/min/mmHg 10.54   DLCO UNC% % 78   DLVA Predicted % 59   TLC L 8.24   TLC % Predicted % 213   RV % Predicted % 341     No results found for: NITRICOXIDE      Assessment & Plan:   No problem-specific Assessment & Plan notes found for this encounter.  Assessment and Plan    Chronic obstructive pulmonary disease (COPD) with emphysema   COPD with emphysema is well-managed according  to a recent CT scan, though breathing varies daily. She uses 2 liters of oxygen  and Breztri  inhaler twice daily. Discussed adding Dupixent, pending eosinophil count, to address airway inflammation. Consider Otuvair nebulizer twice daily to improve  shortness of breath, potentially offering better relief. She is not smoking and adheres to her medication regimen. Emphasized maintaining muscle strength and a high protein diet. Continue Breztri  inhaler and ProAir  as needed. Order blood work for eosinophil count and discuss insurance coverage and patient assistance for Otuvair.  Anxiety   Anxiety is managed with Xanax , taken twice daily. She is unsure about Buspirone prescribed by her primary care provider and sometimes omits it. Continue Xanax  as prescribed.         Madelin Stank, NP 11/13/2023

## 2023-11-13 NOTE — Patient Instructions (Addendum)
 Continue on Breztri  2 puffs Twice daily, rinse after use.  Albuterol  inhaler or neb As needed   Add Ohtuvayre  neb Twice daily   Activity as tolerated Continue on Oxygen  2l/m.  Follow up with Dr. Annella and Kalani Sthilaire NP in 4-6 months and As needed  -30 min slot

## 2023-11-15 ENCOUNTER — Telehealth (HOSPITAL_BASED_OUTPATIENT_CLINIC_OR_DEPARTMENT_OTHER): Payer: Self-pay | Admitting: *Deleted

## 2023-11-15 ENCOUNTER — Telehealth: Payer: Self-pay

## 2023-11-15 NOTE — Telephone Encounter (Signed)
 Patient inquiring about ohtuvayre  medication She thought it would be sent to pharmacy. Explained the process and pharmacy team would work on pt assistance. She will be contacted with update once received. Made patient aware this could take several weeks. She verbalizes understanding.

## 2023-11-15 NOTE — Telephone Encounter (Signed)
 Received Ohtuvayre new start paperwork. Completed form and faxed with clinicals and insurance card copy to [facility]   Phone#: [phone] Fax#: [phone]

## 2023-11-16 NOTE — Telephone Encounter (Signed)
 Received fax from [employer] confirming receipt of Ohtuvayre  enrollment form

## 2023-11-17 NOTE — Telephone Encounter (Signed)
 Received fax from [employer] with summary of benefits. Referral form for Ohtuvayre  received. Rx will be triaged to [facility].. Once benefits investigation completed, pharmacy will reach out the patient to schedule shipment. If medication is unaffordable, patient will need to express financial hardship to be referred back to [place] [facility] for patient assistance program pre-screening.   Patient ID: [id number] Pharmacy phone: [phone] [place] [facility] Phone#: [phone]

## 2023-12-21 ENCOUNTER — Other Ambulatory Visit: Payer: Self-pay | Admitting: Family Medicine

## 2023-12-21 DIAGNOSIS — G8929 Other chronic pain: Secondary | ICD-10-CM

## 2023-12-25 ENCOUNTER — Other Ambulatory Visit: Payer: Self-pay

## 2023-12-25 DIAGNOSIS — G6181 Chronic inflammatory demyelinating polyneuritis: Secondary | ICD-10-CM

## 2023-12-25 DIAGNOSIS — G8929 Other chronic pain: Secondary | ICD-10-CM

## 2023-12-25 MED ORDER — ALPRAZOLAM 0.5 MG PO TABS: 0.5000 mg | ORAL_TABLET | Freq: Two times a day (BID) | ORAL | 2 refills | Status: DC | PRN

## 2023-12-25 MED ORDER — HYDROCODONE-ACETAMINOPHEN 5-325 MG PO TABS: 1.0000 | ORAL_TABLET | Freq: Two times a day (BID) | ORAL | 0 refills | Status: DC | PRN

## 2023-12-25 NOTE — Telephone Encounter (Signed)
 Copied from CRM [id number]. Topic: Clinical - Medication Refill >> Dec 25, 2023  4:35 PM [place] B wrote: Medication: HYDROcodone -acetaminophen  (NORCO/VICODIN) 5-325 MG tablet/ ALPRAZolam  (XANAX ) 0.5 MG tablet    Has the patient contacted their pharmacy? No because of type of med it is  This is the patient's preferred pharmacy:  [facility] [address] [place], [place] - [address] [place] [address] Phone: [phone] Fax: [phone]     Is this the correct pharmacy for this prescription? yes   Has the prescription been filled recently? no  Is the patient out of the medication? no  Has the patient been seen for an appointment in the last year OR does the patient have an upcoming appointment? yes  Can we respond through MyChart? no  Agent: Please be advised that Rx refills may take up to 3 business days. We ask that you follow-up with your pharmacy.

## 2023-12-27 ENCOUNTER — Encounter: Payer: Self-pay | Admitting: Podiatry

## 2023-12-27 ENCOUNTER — Ambulatory Visit (INDEPENDENT_AMBULATORY_CARE_PROVIDER_SITE_OTHER): Admitting: Podiatry

## 2023-12-27 DIAGNOSIS — M79675 Pain in left toe(s): Secondary | ICD-10-CM

## 2023-12-27 DIAGNOSIS — M79674 Pain in right toe(s): Secondary | ICD-10-CM | POA: Diagnosis not present

## 2023-12-27 DIAGNOSIS — G629 Polyneuropathy, unspecified: Secondary | ICD-10-CM

## 2023-12-27 DIAGNOSIS — B351 Tinea unguium: Secondary | ICD-10-CM

## 2024-01-01 ENCOUNTER — Ambulatory Visit (INDEPENDENT_AMBULATORY_CARE_PROVIDER_SITE_OTHER)

## 2024-01-01 ENCOUNTER — Encounter: Payer: Self-pay | Admitting: Podiatry

## 2024-01-01 VITALS — BP 119/77 | HR 89 | Ht 68.0 in | Wt 138.1 lb

## 2024-01-01 DIAGNOSIS — J449 Chronic obstructive pulmonary disease, unspecified: Secondary | ICD-10-CM

## 2024-01-01 DIAGNOSIS — M5442 Lumbago with sciatica, left side: Secondary | ICD-10-CM

## 2024-01-01 DIAGNOSIS — M5441 Lumbago with sciatica, right side: Secondary | ICD-10-CM

## 2024-01-01 DIAGNOSIS — F411 Generalized anxiety disorder: Secondary | ICD-10-CM | POA: Diagnosis not present

## 2024-01-01 DIAGNOSIS — G8929 Other chronic pain: Secondary | ICD-10-CM

## 2024-01-01 MED ORDER — HYDROXYZINE PAMOATE 25 MG PO CAPS: 25.0000 mg | ORAL_CAPSULE | Freq: Three times a day (TID) | ORAL | 0 refills | Status: DC | PRN

## 2024-01-01 NOTE — Progress Notes (Signed)
 Established Patient Office Visit  Subjective   Patient ID: Vickie Bowman, female    DOB: 01-Dec-1950  Age: 73 y.o. MRN: [id number]  Chief Complaint  Patient presents with   Medical Management of Chronic Issues    HPI  History of Present Illness Vickie Bowman is a 73 year old female who presents with difficulty breathing and back pain.  Dyspnea on exertion - Ongoing difficulty breathing, worsened by physical activity such as walking or grocery shopping. Patient currently on supplemental oxygen  therapy for severe COPD. Follows with pulmonology. - Feels 'winded' and 'out of breath' during exertion - No significant dyspnea while sitting or at rest  Chronic back pain - Chronic back pain managed with hydrocodone  5-325 mg  - Hydrocodone  provides pain relief within 10 to 15 minutes - No drowsiness or dizziness with hydrocodone  use - Long-term use of hydrocodone  with continued efficacy  Anxiety and panic attacks - History of anxiety and panic attacks - Two panic attacks in the past week, characterized by palpitations ('heart feels like it's gonna jump out of my skin'), shaking, and nervousness - Xanax  0.5 mg used for anxiety and panic attacks with good effect - Previous trial of Buspar  discontinued due to nausea and dizziness       ROS Per HPI.    Objective:     BP 119/77   Pulse 89   Ht 5' 8 (1.727 m)   Wt 138 lb 1.3 oz (62.6 kg)   SpO2 95%   BMI 20.99 kg/m    Physical Exam Constitutional:      General: She is not in acute distress.    Appearance: Normal appearance.  Cardiovascular:     Rate and Rhythm: Normal rate and regular rhythm.     Heart sounds: Normal heart sounds. No murmur heard.    No friction rub. No gallop.  Pulmonary:     Effort: Pulmonary effort is normal. No respiratory distress.     Breath sounds: Normal breath sounds.     Comments: Patient is wearing supplemental O2 Musculoskeletal:        General: No swelling.  Skin:    General:  Skin is warm and dry.  Neurological:     General: No focal deficit present.     Mental Status: She is alert.  Psychiatric:        Mood and Affect: Mood normal.        Behavior: Behavior normal.        Thought Content: Thought content normal.      No results found for any visits on 01/01/24.    The ASCVD Risk score (Arnett DK, et al., 2019) failed to calculate for the following reasons:   Risk score cannot be calculated because patient has a medical history suggesting prior/existing ASCVD    Assessment & Plan:   Generalized anxiety disorder Assessment & Plan: Anxiety/panic attacks stable with 0.5 mg Alprazolam  as needed. This is a decreased dosage from 1 mg in the past. Patient was prescribed BuSpar  5 mg BID for prevention of anxiety attacks, however she reports that she could not tolerate this medication due to nausea. Discussed with patient that our ultimate goal is decrease usage due to risk of benzodiazepines in people >65+. Will trial hydroxyzine  at a low dose of 25 mg as needed for anxiety attacks to help reduce use of Xanax . If effective, will continue with the following taper plan:     Xanax  Taper started 07/03/2023: Weeks 1 to 4: Xanax   0.75 mg twice daily: Work towards only taking 1.5 tablets (0.75 mg) each morning and 0.75 mg each evening.  If you start to experience withdrawal symptoms like racing heart, GI upset, stay at the last comfortable dosing schedule for an additional 5-7 days. Weeks 5 to 8: Xanax  1.5 tablets (0.75 mg) each morning and 1 tablet (0.5 mg) each evening. Weeks 9 to 12: Xanax  1 tablet (0.5 mg) each morning and 1 tablet (0.5 mg) each evening. Weeks 13 to 15: Xanax  1 tablet (0.5 mg) each morning and 0.5 tablet (0.25 mg) each evening.   Weeks 16 to 18: Xanax  0.5 tablet (0.25 mg) each morning and 0.5 tablet (0.25 mg) each evening. Weeks 19 to 21: Xanax  0.5 tablet (0.25) mg each morning Weeks 22+: only take Xanax  as needed for panic attacks   Stage 2  moderate COPD by GOLD classification [facility]) Assessment & Plan: Pulmonology follow-up in November, cardiology appointment on September 29th scheduled. Continue Breztri  2 puffs twice daily, continuous supplemental O2 (2L). - Continue with scheduled follow-up with pulmonology in November. - Attend cardiology appointment on September 29th.   Chronic midline low back pain with bilateral sciatica Assessment & Plan: Chronic midline low back pain with bilateral sciatica managed with Norco. Effective without causing drowsiness or dizziness. - Continue Norco 5-325 mg as needed for pain management.   Other orders -     hydrOXYzine  Pamoate; Take 1 capsule (25 mg total) by mouth every 8 (eight) hours as needed.  Dispense: 30 capsule; Refill: 0    Return in about 5 months (around 06/02/2024) for Physical, GAD .    Vickie JULIANNA Sacks, PA-C

## 2024-01-01 NOTE — Assessment & Plan Note (Signed)
 Pulmonology follow-up in November, cardiology appointment on September 29th scheduled. Continue Breztri  2 puffs twice daily, continuous supplemental O2 (2L). - Continue with scheduled follow-up with pulmonology in November. - Attend cardiology appointment on September 29th.

## 2024-01-01 NOTE — Progress Notes (Signed)
  Subjective:  Patient ID: Vickie Bowman, female    DOB: 1950/11/19,  MRN: [id number]  Vickie Bowman presents to clinic today for preventative diabetic foot care, at risk foot care with history of peripheral neuropathy, and painful mycotic toenails of both feet that are difficult to trim. Pain interferes with daily activities and wearing enclosed shoe gear comfortably.  Chief Complaint  Patient presents with   Diabetes    RFC Non diabetic toenail trim.Vickie Bowman with PCP 10/2023.   New problem(s): None.   PCP is Gayle Saddie FALCON, PA-C.  Allergies  Allergen Reactions   Penicillins Swelling    Has patient had a PCN reaction causing immediate rash, facial/tongue/throat swelling, SOB or lightheadedness with hypotension: Yes Has patient had a PCN reaction causing severe rash involving mucus membranes or skin necrosis: No Has patient had a PCN reaction that required hospitalization: No Has patient had a PCN reaction occurring within the last 10 years: No If all of the above answers are NO, then may proceed with Cephalosporin use.     Review of Systems: Negative except as noted in the HPI.  Objective: No changes noted in today's physical examination. There were no vitals filed for this visit. Vickie Bowman is a pleasant 73 y.o. female in NAD. AAO x 3.  Vascular Examination: Capillary refill time immediate b/l. Vascular status intact b/l with palpable pedal pulses. Pedal hair absent b/l. No pain with calf compression b/l. Skin temperature gradient WNL b/l. No cyanosis or clubbing b/l. No ischemia or gangrene noted b/l. No varicosities noted.  Neurological Examination: Sensation grossly intact b/l with 10 gram monofilament. Vibratory sensation intact b/l. Pt has subjective symptoms of neuropathy.  Dermatological Examination: Pedal skin with normal turgor, texture and tone b/l.  No open wounds. No interdigital macerations.   Toenails 1-5 b/l thick, discolored, elongated with subungual  debris and pain on dorsal palpation.   Pedal skin is warm and supple b/l LE. No open wounds b/l LE. No interdigital macerations noted b/l LE. Toenails 1-5 b/l elongated, discolored, dystrophic, thickened, crumbly with subungual debris and tenderness to dorsal palpation.  Musculoskeletal Examination: Muscle strength 5/5 to all lower extremity muscle groups bilaterally. No pain, crepitus or joint limitation noted with ROM bilateral LE. HAV with bunion deformity noted b/l LE. Hammertoe(s) right second digit.  Radiographs: None  Assessment/Plan: 1. Pain due to onychomycosis of toenails of both feet   2. Polyneuropathy     Consent given for treatment. Patient examined. All patient's and/or POA's questions/concerns addressed on today's visit. Toenails 1-5 debrided in length and girth without incident. Continue soft, supportive shoe gear daily. Report any pedal injuries to medical professional. Call office if there are any questions/concerns. -Patient/POA to call should there be question/concern in the interim.   Return in about 3 months (around 03/28/2024).  Vickie Bowman Merlin, DPM      [place] LOCATION: [address], [place] [address]                   Office [phone]   [facility] LOCATION: [address] [place], [place] [address] Office [phone]

## 2024-01-01 NOTE — Assessment & Plan Note (Signed)
 Chronic midline low back pain with bilateral sciatica managed with Norco. Effective without causing drowsiness or dizziness. - Continue Norco 5-325 mg as needed for pain management.

## 2024-01-01 NOTE — Patient Instructions (Signed)
 VISIT SUMMARY: Today, we addressed your difficulty breathing, chronic back pain, anxiety, and dermatologic concerns. We made some adjustments to your medications and discussed follow-up appointments.  YOUR PLAN: GENERALIZED ANXIETY DISORDER: You have generalized anxiety disorder with recent panic attacks. Buspar  was not tolerated due to nausea, so we can discontinue this medication.  -Continue taking Xanax  0.5 mg as needed for anxiety attacks, but try to limit to one pill per day or less -We are prescribing hydroxyzine  25 mg to take as needed for panic attacks. Be cautious as it may cause drowsiness. When you experience your next panic attack, try taking the hydroxyzine  first for relief. If this is ineffective, you can then switch to the Xanax .  CHRONIC MIDLINE LOW BACK PAIN WITH BILATERAL SCIATICA: Your chronic back pain is being managed with Norco, which has been effective without causing drowsiness or dizziness. -Continue taking Hydrocodone  5-325 mg as needed for pain management.   If you have any problems before your next visit feel free to message me via MyChart (minor issues or questions) or call the office, otherwise you may reach out to schedule an office visit.  Thank you! Saddie Sacks, PA-C

## 2024-01-01 NOTE — Assessment & Plan Note (Addendum)
 Anxiety/panic attacks stable with 0.5 mg Alprazolam  as needed. This is a decreased dosage from 1 mg in the past. Patient was prescribed BuSpar  5 mg BID for prevention of anxiety attacks, however she reports that she could not tolerate this medication due to nausea. Discussed with patient that our ultimate goal is decrease usage due to risk of benzodiazepines in people >65+. Will trial hydroxyzine  at a low dose of 25 mg as needed for anxiety attacks to help reduce use of Xanax . If effective, will continue with the following taper plan:     Xanax  Taper started 07/03/2023: Weeks 1 to 4: Xanax  0.75 mg twice daily: Work towards only taking 1.5 tablets (0.75 mg) each morning and 0.75 mg each evening.  If you start to experience withdrawal symptoms like racing heart, GI upset, stay at the last comfortable dosing schedule for an additional 5-7 days. Weeks 5 to 8: Xanax  1.5 tablets (0.75 mg) each morning and 1 tablet (0.5 mg) each evening. Weeks 9 to 12: Xanax  1 tablet (0.5 mg) each morning and 1 tablet (0.5 mg) each evening. Weeks 13 to 15: Xanax  1 tablet (0.5 mg) each morning and 0.5 tablet (0.25 mg) each evening.   Weeks 16 to 18: Xanax  0.5 tablet (0.25 mg) each morning and 0.5 tablet (0.25 mg) each evening. Weeks 19 to 21: Xanax  0.5 tablet (0.25) mg each morning Weeks 22+: only take Xanax  as needed for panic attacks

## 2024-01-09 ENCOUNTER — Ambulatory Visit: Payer: Self-pay

## 2024-01-09 NOTE — Telephone Encounter (Signed)
 Called and spoke with patient. She's coming tomorrow.

## 2024-01-09 NOTE — Telephone Encounter (Addendum)
 First and second attempt made; no answer Third attempt; no answer; forwarded to office.   Copied from CRM [id number]. Topic: Clinical - Medical Advice >> Jan 09, 2024 10:44 AM Jasmin G wrote: Reason for CRM: Pt is experiencing dark and smelly urine that started about a week ago, no other symptoms mentioned, please call her back at [phone] to establish best course of action.

## 2024-01-09 NOTE — Telephone Encounter (Signed)
 Tried calling the patient to get her scheduled for an appointment; no answer. Not able to left a voicemail.

## 2024-01-10 ENCOUNTER — Ambulatory Visit (INDEPENDENT_AMBULATORY_CARE_PROVIDER_SITE_OTHER)

## 2024-01-10 ENCOUNTER — Telehealth: Payer: Self-pay | Admitting: *Deleted

## 2024-01-10 VITALS — BP 121/72 | HR 91 | Ht 68.0 in | Wt 135.8 lb

## 2024-01-10 DIAGNOSIS — R82998 Other abnormal findings in urine: Secondary | ICD-10-CM | POA: Insufficient documentation

## 2024-01-10 LAB — POCT URINALYSIS DIPSTICK
Bilirubin, UA: NEGATIVE
Glucose, UA: NEGATIVE
Ketones, UA: NEGATIVE
Leukocytes, UA: NEGATIVE
Nitrite, UA: NEGATIVE
Protein, UA: NEGATIVE
Spec Grav, UA: <=1.005 — AB (ref 1.010–1.025)
Urobilinogen, UA: 0.2 U/dL
pH, UA: 5.5 (ref 5.0–8.0)

## 2024-01-10 MED ORDER — HYDROXYZINE PAMOATE 25 MG PO CAPS: 25.0000 mg | ORAL_CAPSULE | Freq: Three times a day (TID) | ORAL | 0 refills | Status: AC | PRN

## 2024-01-10 NOTE — Patient Instructions (Signed)
 VISIT SUMMARY: Today, we discussed your concerns about urine discoloration and the possibility of a urinary tract infection. We reviewed your hydration habits, medication access, and adherence to prescribed treatments.  YOUR PLAN: -MICROSCOPIC HEMATURIA: Microscopic hematuria means there is a small amount of blood in your urine that is not visible to the naked eye. We will send your urine for further microscopic evaluation and call you with the results.  -DEHYDRATION: Dehydration occurs when your body does not have enough water. This can cause your urine to appear darker. You are encouraged to increase your fluid intake to 60-80 ounces daily, preferably clear water.  -PANIC ATTACKS: We have resent your prescription for hydroxyzine . Please check with your pharmacy for availability and call us  if it is still unavailable.  INSTRUCTIONS: We will call you with the results of your urine microscopic evaluation. Please increase your fluid intake to 60-80 ounces daily, preferably clear water. Check with your pharmacy for the availability of hydroxyzine  and call us  if it is still unavailable.  If you have any problems before your next visit feel free to message me via MyChart (minor issues or questions) or call the office, otherwise you may reach out to schedule an office visit.  Thank you! Saddie Sacks, PA-C

## 2024-01-10 NOTE — Assessment & Plan Note (Signed)
 Microscopic hematuria noted without UTI or other concerning findings. Possible kidney stone considered despite no history. - Send urine for microscopic evaluation. - Intermittent dark urine likely due to dehydration. No UTI signs. - Encouraged increased fluid intake to 60-80 ounces daily, prefer clear water.

## 2024-01-10 NOTE — Progress Notes (Signed)
 Established Patient Office Visit  Subjective   Patient ID: Vickie Bowman, female    DOB: April 03, 1951  Age: 73 y.o. MRN: [id number]  Chief Complaint  Patient presents with   Urinary Odor    HPI  Discussed the use of AI scribe software for clinical note transcription with the patient, who gave verbal consent to proceed.  History of Present Illness   Vickie Bowman is a 73 year old female who presents with concerns about urine discoloration and potential urinary tract infection.  Urinary discoloration and hematuria - Urine color varies from clear to dark - Urine testing revealed a small amount of blood, with no white blood cells,  sugar, or ketones - No visible blood observed in urine - No history of nephrolithiasis  Hydration and dietary intake - Consumes only flavored water, avoids plain water - Brother prepares smoothies with watermelon, cucumber, and lemon juice, which increase urinary frequency  Medication access and adherence - Did not receive prescribed hydroxyzine  from pharmacy last week - Received Breztri  inhalers as prescribed        ROS Per HPI.    Objective:     BP 121/72   Pulse 91   Ht 5' 8 (1.727 m)   Wt 135 lb 12 oz (61.6 kg)   SpO2 94% Comment: @2L   BMI 20.64 kg/m    Physical Exam Constitutional:      General: She is not in acute distress.    Appearance: Normal appearance.  Cardiovascular:     Rate and Rhythm: Normal rate and regular rhythm.     Heart sounds: Normal heart sounds. No murmur heard.    No friction rub. No gallop.  Pulmonary:     Effort: Pulmonary effort is normal. No respiratory distress.     Breath sounds: Normal breath sounds.  Abdominal:     Tenderness: There is no right CVA tenderness or left CVA tenderness.  Musculoskeletal:        General: No swelling.  Skin:    General: Skin is warm and dry.  Neurological:     General: No focal deficit present.     Mental Status: She is alert.  Psychiatric:        Mood and  Affect: Mood normal.        Behavior: Behavior normal.        Thought Content: Thought content normal.     Results for orders placed or performed in visit on 01/10/24  POCT Urinalysis Dipstick  Result Value Ref Range   Color, UA YELLOW    Clarity, UA CLEAR    Glucose, UA Negative Negative   Bilirubin, UA NEGATIVE    Ketones, UA NEGATIVE    Spec Grav, UA <=1.005 (A) 1.010 - 1.025   Blood, UA TRACE    pH, UA 5.5 5.0 - 8.0   Protein, UA Negative Negative   Urobilinogen, UA 0.2 0.2 or 1.0 E.U./dL   Nitrite, UA NEGATIVE    Leukocytes, UA Negative Negative   Appearance     Odor        The ASCVD Risk score (Arnett DK, et al., 2019) failed to calculate for the following reasons:   Risk score cannot be calculated because patient has a medical history suggesting prior/existing ASCVD    Assessment & Plan:   Dark urine Assessment & Plan: Microscopic hematuria noted without UTI or other concerning findings. Possible kidney stone considered despite no history. - Send urine for microscopic evaluation. - Intermittent dark urine likely  due to dehydration. No UTI signs. - Encouraged increased fluid intake to 60-80 ounces daily, prefer clear water.    Orders: -     Urine Microscopic -     POCT urinalysis dipstick  Other orders -     hydrOXYzine  Pamoate; Take 1 capsule (25 mg total) by mouth every 8 (eight) hours as needed.  Dispense: 30 capsule; Refill: 0     Return if symptoms worsen or fail to improve.    Saddie JULIANNA Sacks, PA-C

## 2024-01-10 NOTE — Telephone Encounter (Signed)
 Copied from CRM [id number]. Topic: Clinical - Medication Question >> Jan 10, 2024  3:48 PM Marissa P wrote: Reason for CRM: Patient wanted to let doctor know she received her medication. Thank you

## 2024-01-13 LAB — URINALYSIS, MICROSCOPIC ONLY

## 2024-01-15 ENCOUNTER — Ambulatory Visit: Payer: Self-pay

## 2024-01-15 NOTE — Telephone Encounter (Signed)
 FYI Only or Action Required?: Action required by provider: update on patient condition.  Patient was last seen in primary care on 10/26/2022 by Hanford Powell BRAVO, NP.  Called Nurse Triage reporting Shortness of Breath.  Symptoms began yesterday.  Interventions attempted: Other: pt increased O2 from 2L to 3L.  Symptoms are: unchanged.  Triage Disposition: Go to ED Now (Notify PCP)  Patient/caregiver understands and will follow disposition?: No, wishes to speak with PCP                 Copied from CRM #[id number]. Topic: Clinical - Red Word  Triage >> Jan 15, 2024 11:11 AM Winona R wrote: Pt believes she may have covid.  Increase cough and runny nose. Dizziness, weakness, short of breath and have increased oxygen  some body aches. Generally just not feeling well. Pt would like to know if she can have a test done then receive medication if it is positive. Reason for Disposition  [1] MODERATE difficulty breathing (e.g., speaks in phrases, SOB even at rest, pulse 100-120) AND [2] NEW-onset or WORSE than normal  Answer Assessment - Initial Assessment Questions This RN recommends pt goes to ED and offered to call pt an ambulance if she doesn't have an adult to drive her there. Pt states her brother should be home soon and he could take her but she doesn't really want to go. This RN attempted to call [facility] x2 with no answer. This RN will send a high priority message to clinic. Pt requests a call back at [phone].   Pt states she has COPD and had to turn her oxygen  up (normally 2L and pt turned up to 3L). Pt states this is helping a little bit. Pt is unable to check O2 saturation  No smell or taste  RESPIRATORY STATUS: Describe your breathing? (e.g., wheezing, shortness of breath, unable to speak, severe coughing)      SOB at rest but when get up really short of breath; pt describes it as moderate  ONSET: When did this breathing problem begin?      Started  yesterday  PATTERN Does the difficult breathing come and go, or has it been constant since it started?      Kind of constant  OTHER SYMPTOMS: Do you have any other symptoms? (e.g., chest pain, cough, dizziness, fever, runny nose)     Chronic cough with COPD, seems to have gotten worse  Denies fever, chest pain Dizziness when standing up  Protocols used: Breathing Difficulty-A-AH

## 2024-01-16 ENCOUNTER — Ambulatory Visit: Payer: Self-pay

## 2024-01-16 ENCOUNTER — Ambulatory Visit (INDEPENDENT_AMBULATORY_CARE_PROVIDER_SITE_OTHER)

## 2024-01-16 VITALS — BP 129/77 | HR 105 | Temp 97.3°F | Ht 68.0 in | Wt 135.0 lb

## 2024-01-16 DIAGNOSIS — R051 Acute cough: Secondary | ICD-10-CM | POA: Diagnosis not present

## 2024-01-16 DIAGNOSIS — J441 Chronic obstructive pulmonary disease with (acute) exacerbation: Secondary | ICD-10-CM

## 2024-01-16 LAB — POC COVID19/FLU A&B COMBO
Covid Antigen, POC: NEGATIVE
Influenza A Antigen, POC: NEGATIVE
Influenza B Antigen, POC: NEGATIVE

## 2024-01-16 MED ORDER — DOXYCYCLINE HYCLATE 100 MG PO TABS: 100.0000 mg | ORAL_TABLET | Freq: Two times a day (BID) | ORAL | 0 refills | Status: DC

## 2024-01-16 MED ORDER — PREDNISONE 10 MG (21) PO TBPK: ORAL_TABLET | ORAL | 0 refills | Status: DC

## 2024-01-16 NOTE — Telephone Encounter (Signed)
 It doesn't look like she went to the ED.  Can you call her to see if she would like to have a nurse visit to get tested for covid/flu

## 2024-01-16 NOTE — Telephone Encounter (Signed)
 Patient states she called earlier and spoke with NT but was told there are no appointments today. She is asking due to having her breathing difficulties if she can be seen tomorrow with PCP. Read PCP's note that patient can be added to schedule for today for testing COVID/flu for provider visit. Called [facility] and connected patient with office staff.

## 2024-01-16 NOTE — Telephone Encounter (Signed)
 FYI Only or Action Required?: Action required by provider: requesting medication for possible URI.  Patient was last seen in primary care on 10/26/2022 by Hanford Powell BRAVO, NP.  Called Nurse Triage reporting Cough.  Symptoms began 2 days ago-patient has a chronic cough but URI symptoms started 2 days ago.  Interventions attempted: Rest, hydration, or home remedies.  Symptoms are: unchanged.  Triage Disposition: See HCP Within 4 Hours (Or PCP Triage)-requesting a follow up phone call from office.   Patient/caregiver understands and will follow disposition?: No, wishes to speak with PCP   Summary: neg covid test,   Patient spoke to NT yesterday, patient states symptoms have not worsened, they've stayed the same but she did take a covid test and it came out negative. Patient believes she may have an upper respiratory infection and is requesting an antibiotic.     Reason for Disposition  [1] MILD difficulty breathing (e.g., minimal/no SOB at rest, SOB with walking, pulse < 100) AND [2] still present when not coughing  (Exception: No change from usual, chronic shortness of breath.)  Answer Assessment - Initial Assessment Questions 1. ONSET: When did the cough begin?      Chronic cough-6 years   2. SEVERITY: How bad is the cough today?      8 out of 10 in severity 3. SPUTUM: Describe the color of your sputum (e.g., none, dry cough; clear, white, yellow, green)     clear 4. HEMOPTYSIS: Are you coughing up any blood? If so ask: How much? (e.g., flecks, streaks, tablespoons, etc.)     no 5. DIFFICULTY BREATHING: Are you having difficulty breathing? If Yes, ask: How bad is it? (e.g., mild, moderate, severe)      Mild Shortness of breath that has increased over 2 days 6. FEVER: Do you have a fever? If Yes, ask: What is your temperature, how was it measured, and when did it start?     no 7. CARDIAC HISTORY: Do you have any history of heart disease? (e.g., heart attack,  congestive heart failure)      no 8. LUNG HISTORY: Do you have any history of lung disease?  (e.g., pulmonary embolus, asthma, emphysema)     COPD 9. PE RISK FACTORS: Do you have a history of blood clots? (or: recent major surgery, recent prolonged travel, bedridden)     no 10. OTHER SYMPTOMS: Do you have any other symptoms? (e.g., runny nose, wheezing, chest pain)       Runny nose, congestion 12. TRAVEL: Have you traveled out of the country in the last month? (e.g., travel history, exposures)       No  Cough, congestion and an increase in SOB started two days ago. Patient reports taking a covid test yesterday and it was negative. Patient was triaged yesterday but she refused ED. Patient is requesting an antibiotic to be sent to her pharmacy for possible URI. Discussed with patient the need to be seen in person. Patient would like to see if provider would send in antibiotic for her. Patient is needing follow up phone call.  Protocols used: Cough - Chronic-A-AH

## 2024-01-16 NOTE — Patient Instructions (Signed)
 VISIT SUMMARY: Today, you were seen for a worsening cough and respiratory symptoms. You have a history of COPD, and your symptoms have been more severe over the past three days, especially at night. You also have increased nasal congestion and a runny nose. You suspect you may have caught this illness from your daughter, who recently had an upper respiratory infection and bronchitis. Your oxygen  needs have increased slightly, but your condition is stable at rest.  YOUR PLAN: -CHRONIC OBSTRUCTIVE PULMONARY DISEASE (COPD) WITH ACUTE UPPER RESPIRATORY INFECTION AND COUGH: You have an acute viral upper respiratory infection, which is causing increased coughing and nasal congestion. This infection has also led to a worsening of your COPD symptoms. You will take doxycycline  100 mg twice daily for 10 days to treat the infection, and a prednisone  burst to reduce inflammation. Continue using the herbal cough syrup and consider taking Mucinex  to help thin the mucus. Monitor your oxygen  levels at home with your pulse oximeter and replace the batteries if needed. If your shortness of breath worsens, go to the emergency room immediately.  -ALLERGY TO PENICILLIN: You have an allergy to penicillin, so doxycycline  has been chosen as an alternative antibiotic for your upper respiratory infection. Make sure to avoid penicillin in the future.  INSTRUCTIONS: Please follow the prescribed medication regimen: doxycycline  100 mg twice daily for 10 days and the prednisone  burst as directed. Continue using the herbal cough syrup and consider taking Mucinex . Monitor your oxygen  levels at home and replace the pulse oximeter batteries if needed. Go to the emergency room if your shortness of breath worsens.  If you have any problems before your next visit feel free to message me via MyChart (minor issues or questions) or call the office, otherwise you may reach out to schedule an office visit.  Thank you! Saddie Sacks, PA-C

## 2024-01-16 NOTE — Progress Notes (Signed)
 Acute Office Visit  Subjective:     Patient ID: Vickie Bowman, female    DOB: 1950/08/14, 73 y.o.   MRN: [id number]  Chief Complaint  Patient presents with   Medical Management of Chronic Issues    HPI  Discussed the use of AI scribe software for clinical note transcription with the patient, who gave verbal consent to proceed.  History of Present Illness   Vickie Bowman is a 73 year old female with COPD who presents with worsening cough and respiratory symptoms.  Cough and upper respiratory symptoms - Worsening cough for the past three days - Cough is most severe at night - Increased nasal congestion and rhinorrhea - No fever, body aches, or chills - No use of over-the-counter medications for cough - Using an herbal cough syrup from [employer] with partial relief - Suspects illness was contracted from her daughter, who recently had an upper respiratory infection and bronchitis  Chronic obstructive pulmonary disease (copd) exacerbation - History of COPD with prior exacerbations treated with prednisone  - Increased oxygen  requirement from baseline 2L to 3L via nasal cannula - Uses supplemental oxygen  at home - Home pulse oximeter available for use  - No recent use of antibiotics; allergic to penicillin but tolerates doxycycline       ROS Per HPI     Objective:    BP 129/77   Pulse (!) 105   Temp (!) 97.3 F (36.3 C) (Oral)   Ht 5' 8 (1.727 m)   Wt 135 lb (61.2 kg)   SpO2 93% Comment: Patient is using her portal O2: 3 liters  BMI 20.53 kg/m    Physical Exam Constitutional:      General: She is not in acute distress.    Appearance: Normal appearance.  HENT:     Right Ear: Tympanic membrane normal.     Left Ear: Tympanic membrane normal.     Mouth/Throat:     Mouth: Mucous membranes are moist.     Pharynx: Oropharynx is clear.  Cardiovascular:     Rate and Rhythm: Normal rate and regular rhythm.     Heart sounds: Normal heart sounds. No murmur heard.     No friction rub. No gallop.  Pulmonary:     Effort: No respiratory distress.     Breath sounds: Normal breath sounds.     Comments: O2 Cannula present on 3L Musculoskeletal:        General: No swelling.  Skin:    General: Skin is warm and dry.  Neurological:     General: No focal deficit present.     Mental Status: She is alert.  Psychiatric:        Mood and Affect: Mood normal.        Behavior: Behavior normal.        Thought Content: Thought content normal.     Results for orders placed or performed in visit on 01/16/24  POC Covid19/Flu A&B Antigen  Result Value Ref Range   Influenza A Antigen, POC Negative Negative   Influenza B Antigen, POC Negative Negative   Covid Antigen, POC Negative Negative        Assessment & Plan:   Acute cough -     POC Covid19/Flu A&B Antigen  COPD with exacerbation (HCC) -     POC Covid19/Flu A&B Antigen -     Doxycycline  Hyclate; Take 1 tablet (100 mg total) by mouth 2 (two) times daily.  Dispense: 20 tablet; Refill: 0 -  predniSONE ; Use as directed.  Dispense: 21 each; Refill: 0  COPD with acute exacerbation (HCC) Assessment & Plan: Acute viral upper respiratory infection with increased cough and nasal congestion. COPD increases risk for exacerbation. No fever, body aches, or chills. Oxygen  requirement increased from 2 to 3 liters, now stable at 2 liters at rest. Lungs clear, no chest x-ray needed. - Prescribed doxycycline  100 mg twice daily for 10 days, take with food. - Prescribed Sterapred to reduce inflammation. - Continue herbal cough syrup. - Recommend Mucinex  to thin mucus. - Advise monitoring oxygen  levels at home with pulse oximeter, replace batteries if needed. - Instructed to go to the emergency room if shortness of breath worsens or O2 persistently drops below 90. Patient verbalized understanding and was in agreement with the plan.        Return if symptoms worsen or fail to improve.  Vickie JULIANNA Sacks, PA-C

## 2024-01-16 NOTE — Assessment & Plan Note (Signed)
 Acute viral upper respiratory infection with increased cough and nasal congestion. COPD increases risk for exacerbation. No fever, body aches, or chills. Oxygen  requirement increased from 2 to 3 liters, now stable at 2 liters at rest. Lungs clear, no chest x-ray needed. - Prescribed doxycycline  100 mg twice daily for 10 days, take with food. - Prescribed Sterapred to reduce inflammation. - Continue herbal cough syrup. - Recommend Mucinex  to thin mucus. - Advise monitoring oxygen  levels at home with pulse oximeter, replace batteries if needed. - Instructed to go to the emergency room if shortness of breath worsens or O2 persistently drops below 90. Patient verbalized understanding and was in agreement with the plan.

## 2024-01-16 NOTE — Telephone Encounter (Signed)
Patient is here in office.

## 2024-01-17 ENCOUNTER — Ambulatory Visit

## 2024-01-18 ENCOUNTER — Other Ambulatory Visit: Payer: Self-pay | Admitting: Neurology

## 2024-01-19 NOTE — Telephone Encounter (Signed)
 Last seen on 05/31/23 Follow up scheduled on 05/30/24    Dispensed Days Supply Quantity Provider Pharmacy  PREGABALIN  25MG  CAP 10/02/2023 30 60 each Camara, Amadou, MD [facility]...     Rx pending to be signed

## 2024-02-19 ENCOUNTER — Ambulatory Visit: Attending: Internal Medicine | Admitting: Internal Medicine

## 2024-02-19 ENCOUNTER — Encounter: Payer: Self-pay | Admitting: Internal Medicine

## 2024-02-19 VITALS — BP 106/72 | HR 80 | Ht 68.0 in | Wt 136.0 lb

## 2024-02-19 DIAGNOSIS — E785 Hyperlipidemia, unspecified: Secondary | ICD-10-CM

## 2024-02-19 DIAGNOSIS — I2510 Atherosclerotic heart disease of native coronary artery without angina pectoris: Secondary | ICD-10-CM | POA: Diagnosis not present

## 2024-02-19 DIAGNOSIS — R0602 Shortness of breath: Secondary | ICD-10-CM | POA: Diagnosis not present

## 2024-02-19 DIAGNOSIS — J449 Chronic obstructive pulmonary disease, unspecified: Secondary | ICD-10-CM | POA: Diagnosis not present

## 2024-02-19 NOTE — Progress Notes (Signed)
 OFFICE NOTE  Chief Complaint:  Follow-up  Primary Care Physician: Gayle Saddie FALCON, PA-C  HPI:  Vickie Bowman is a 73 y.o. female with a past medial history significant for hospitalization in September for acute dyspnea.  She was admitted for COPD exacerbation was found to have elevated troponin of 1.55.  Cardiology was consulted and echo showed an EF 35 to 40%.  I recommended heart catheterization for her.  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  LVEF at the time was up to 50 to 55%.  Was subsequently seen by Scot Ford, PA-C, and follow-up and a limited echo was ordered.  She was noted to be on atorvastatin  40 mg daily.  A fasting lipid profile LFTs were also ordered.  Recently the labs resulted and she did have marked improvement in her lipid profile with a total cholesterol 143 and LDL of 52, decreased from 870.  Unfortunately, she had an acute rise in AST and ALT to 97 and 185 respectively from 32 and 25.  This suggest there may be underlying steatohepatitis.  She remains asymptomatic with respect to that.  She is followed in pulmonary has an upcoming appointment in 2 weeks.  She is noted to be on oxygen .  Otherwise besides shortness of breath denies any chest pain.  06/17/2019  Vickie Bowman returns today for follow-up.  Her main issues continue to be with COPD.  She is on oxygen  but has been off of cigarettes for about 2 years.  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  I advised long-term beta-blocker as well as low-dose aspirin .  She did have some coronary disease and was on atorvastatin  however had elevated liver enzymes.  Based on that it was discontinued.  Since then her cholesterol apparently is gone up however I do not have access to those records at this time.  We will request those from her primary care provider.  07/02/2019  Vickie Bowman is seen today in follow-up.  Unfortunately she had COVID-19  in November 2021.  Since then she says she has been a little more fatigued and short of breath.  She says she tires real easily as well.  She is on continuous oxygen .  She uses a finger oximeter at home and her oxygen  levels have been over 90%.  She is also been having some leg pains and achiness bilaterally.  She is not sure what this could be related to however I offered it might be due to her statin.  She had previously taken atorvastatin  but had issues with liver enzyme elevation.  Rosuvastatin  is not cause that issue but it could be causing her pain.  We talked about a 2-week statin holiday to see if this resolves.  As mentioned above her last echo in 2020 showed normalization of her LVEF however she has a history of cardiomyopathy in the past.  11/19/2021  Vickie Bowman returns today for follow-up.  Overall she is doing well although continues to have shortness of breath on oxygen  with her COPD.  She has maintained her weight fairly well but remains in the low end of normal.  She is due for repeat lipids.  She had called in saying that she was having issues with leg pain.  We advised her to stop her statin and contact us  after 2 weeks to see if her symptoms had resolved.  Apparently they did not change but she never went back on  her medicine.  She was on statin 20 mg daily.  Blood pressure is well controlled today.  She denies any chest pain or worsening shortness of breath.  02/24/2023  Vickie Bowman is seen today in follow-up.  She seems to be doing well.  She was seen by one of our nurse practitioners recently.  She had noted 2 episodes of atypical chest pain but has had no further ones.  She remains on oxygen  with stable findings.  She denies any chest pain or worsening shortness of breath.  Blood pressure is well-controlled today.  She had lipid testing in September.  Total cholesterol 142, HDL 59, triglycerides 96 and LDL 65 after I had recommended her going back on rosuvastatin .  She has had abnormal liver  enzymes in the past however her ALT was normal.  The only other abnormal finding was a mildly elevated TSH at 6.3.  Thyroid  hormones were not checked.  She is scheduled to have a new patient visit with Dr. Thedora in October.  She is asking about restarting pain medications today specifically narcotics which she has used in the past very sparingly for back pain.  02/19/2024  Vickie Bowman is seen today in follow-up.  She seems to be doing well but has had some worsening shortness of breath.  She notes mostly she is short of breath when laying down and with exertion.  She does have some advanced lung disease and is followed closely with pulmonary.  She has not had any repeat lab work recently.  She does have a history of congestive heart failure however her LVEF had normalized by echo, but that was last in 2020.  She denies any lower extremity edema.  PMHx:  Past Medical History:  Diagnosis Date   Anxiety    CHF (congestive heart failure) (HCC)    COPD (chronic obstructive pulmonary disease) (HCC)    Lumbar herniated disc    Tobacco abuse     Past Surgical History:  Procedure Laterality Date   LEFT HEART CATH AND CORONARY ANGIOGRAPHY N/A 02/05/2018   Procedure: LEFT HEART CATH AND CORONARY ANGIOGRAPHY;  Surgeon: Mady Bruckner, MD;  Location: [facility];  Service: Cardiovascular;  Laterality: N/A;    FAMHx:  Family History  Problem Relation Age of Onset   Liver cancer Mother    Breast cancer Sister    CAD Neg Hx     SOCHx:   reports that she quit smoking about 6 years ago. Her smoking use included cigarettes. She started smoking about 56 years ago. She has a 79.5 pack-year smoking history. She has been exposed to tobacco smoke. She has never used smokeless tobacco. She reports that she does not currently use alcohol. She reports current drug use. Drug: Hydrocodone .  ALLERGIES:  Allergies  Allergen Reactions   Penicillins Swelling    Has patient had a PCN reaction causing  immediate rash, facial/tongue/throat swelling, SOB or lightheadedness with hypotension: Yes Has patient had a PCN reaction causing severe rash involving mucus membranes or skin necrosis: No Has patient had a PCN reaction that required hospitalization: No Has patient had a PCN reaction occurring within the last 10 years: No If all of the above answers are NO, then may proceed with Cephalosporin use.     ROS: Pertinent items noted in HPI and remainder of comprehensive ROS otherwise negative.  HOME MEDS: Current Outpatient Medications on File Prior to Visit  Medication Sig Dispense Refill   albuterol  (PROVENTIL ) (2.5 MG/3ML) 0.083% nebulizer solution USE  1 VIAL IN NEBULIZER EVERY 6 HOURS AS NEEDED FOR WHEEZING FOR SHORTNESS OF BREATH 90 mL 1   albuterol  (VENTOLIN  HFA) 108 (90 Base) MCG/ACT inhaler Inhale 2 puffs into the lungs every 6 (six) hours as needed for wheezing or shortness of breath. 8 g 6   ALPRAZolam  (XANAX ) 0.5 MG tablet Take 1 tablet (0.5 mg total) by mouth 2 (two) times daily as needed for anxiety. 60 tablet 2   aspirin  EC 81 MG EC tablet Take 1 tablet (81 mg total) by mouth daily. 30 tablet 0   BREZTRI  AEROSPHERE 160-9-4.8 MCG/ACT AERO inhaler INHALE 2 PUFFS IN THE MORNING AND AT BEDTIME 11 g 0   Cholecalciferol (VITAMIN D ) 2000 units CAPS Take 1 capsule (2,000 Units total) by mouth daily. 30 capsule 5   Dextromethorphan  HBr 15 MG CAPS Take 2 capsules (30 mg total) by mouth 3 (three) times daily as needed (cough). 28 capsule 0   diclofenac  Sodium (VOLTAREN ) 1 % GEL Apply 2 g topically 4 (four) times daily. To affected joint. 100 g 11   doxycycline  (VIBRA -TABS) 100 MG tablet Take 1 tablet (100 mg total) by mouth 2 (two) times daily. 20 tablet 0   DULoxetine  (CYMBALTA ) 30 MG capsule Take 1 capsule (30 mg total) by mouth daily. 90 capsule 3   DULoxetine  (CYMBALTA ) 60 MG capsule Take 1 capsule (60 mg total) by mouth daily. 90 capsule 3   Ensifentrine  (OHTUVAYRE ) 3 MG/2.5ML SUSP  Inhale 1 Inhalation into the lungs in the morning and at bedtime.     HYDROcodone -acetaminophen  (NORCO/VICODIN) 5-325 MG tablet Take 1 tablet by mouth 2 (two) times daily as needed for severe pain (pain score 7-10). 30 tablet 0   hydrOXYzine  (VISTARIL ) 25 MG capsule Take 1 capsule (25 mg total) by mouth every 8 (eight) hours as needed. 30 capsule 0   ipratropium-albuterol  (DUONEB) 0.5-2.5 (3) MG/3ML SOLN Take 3 mLs by nebulization every 6 (six) hours as needed. 90 mL 3   ketoconazole  (NIZORAL ) 2 % cream Apply 1 Application topically daily. 60 g 2   meloxicam  (MOBIC ) 7.5 MG tablet Take 1 tablet by mouth once daily 90 tablet 0   metoprolol  succinate (TOPROL -XL) 25 MG 24 hr tablet Take 0.5 tablets (12.5 mg total) by mouth daily. 90 tablet 1   predniSONE  (STERAPRED UNI-PAK 21 TAB) 10 MG (21) TBPK tablet Use as directed. 21 each 0   pregabalin  (LYRICA ) 25 MG capsule Take 1 capsule by mouth twice daily 60 capsule 5   rosuvastatin  (CRESTOR ) 20 MG tablet Take 1 tablet by mouth once daily 90 tablet 3   Spacer/Aero-Holding Chambers (AEROCHAMBER MV) inhaler Use as instructed 1 each 0   No current facility-administered medications on file prior to visit.    LABS/IMAGING: No results found for this or any previous visit (from the past 48 hours). No results found.  LIPID PANEL:    Component Value Date/Time   CHOL 142 02/15/2023 1015   TRIG 96 02/15/2023 1015   HDL 59 02/15/2023 1015   CHOLHDL 2.4 02/15/2023 1015   CHOLHDL 3.0 02/02/2018 0624   VLDL 9 02/02/2018 0624   LDLCALC 65 02/15/2023 1015     WEIGHTS: Wt Readings from Last 3 Encounters:  02/19/24 136 lb (61.7 kg)  01/16/24 135 lb (61.2 kg)  01/10/24 135 lb 12 oz (61.6 kg)    VITALS: BP 106/72 (BP Location: Left Arm, Patient Position: Sitting, Cuff Size: Normal)   Pulse 80   Ht 5' 8 (1.727 m)   Wt  136 lb (61.7 kg)   SpO2 94%   BMI 20.68 kg/m   EXAM: General appearance: alert, no distress and On oxygen  Neck: no carotid bruit,  no JVD and thyroid  not enlarged, symmetric, no tenderness/mass/nodules Lungs: diminished breath sounds bilaterally and rhonchi bilaterally Heart: regular rate and rhythm, S1, S2 normal, no murmur, click, rub or gallop Abdomen: soft, non-tender; bowel sounds normal; no masses,  no organomegaly and Scaphoid Extremities: extremities normal, atraumatic, no cyanosis or edema Pulses: 2+ and symmetric Skin: Skin color, texture, turgor normal. No rashes or lesions Neurologic: Grossly normal Psych: Pleasant  EKG: EKG Interpretation Date/Time:  Monday February 19 2024 13:49:20 EDT Ventricular Rate:  71 PR Interval:  142 QRS Duration:  86 QT Interval:  394 QTC Calculation: 428 R Axis:   59  Text Interpretation: Normal sinus rhythm Normal ECG When compared with ECG of 24-Feb-2023 15:01, No significant change was found Confirmed by Mona Kent [phone]) on 02/19/2024 2:02:52 PM    ASSESSMENT: COPD on oxygen   History of COVID-19 infection (03/2020) History of acute systolic congestive heart failure, LVEF 35 to 40%-improved to 55 to 60% (05/2018) Mild nonobstructive coronary disease (01/2018) Elevated LFTs on statin - now resolved Dyslipidemia, goal LDL less than 70  PLAN: 1.   Mrs. Anstey has had some progressive dyspnea with exertion and notes some orthopnea but does have significant lung disease.  Her EF last was 55 to 60% in 2020.  Would like to repeat an echo to make sure there is not been any recent decline in LV function might explain her worsening shortness of breath or whether it is just purely worsening pulmonary disease.  Also repeat lipids including NMR LP(a) and liver enzymes as she had an issue with elevated liver enzymes in the past on statins.  Plan otherwise follow-up in 1 year or sooner as necessary.  Kent KYM Mona, MD, [facility], FNLA, FACP  [place]  [facility]  Medical Director of the Advanced Lipid Disorders &  Cardiovascular Risk Reduction Clinic Diplomate of the  American Board of Clinical Lipidology Attending Cardiologist  Direct Dial: [phone]  Fax: [phone]  Website:  [email or web address] Kent BROCKS Mallika Sanmiguel 02/19/2024, 2:03 PM

## 2024-02-19 NOTE — Patient Instructions (Signed)
 Medication Instructions:  NO CHANGES  *If you need a refill on your cardiac medications before your next appointment, please call your pharmacy*  Lab Work: NMR lipoprofile, LPa, CMET -- 1st Floor  If you have labs (blood work) drawn today and your tests are completely normal, you will receive your results only by: MyChart Message (if you have MyChart) OR A paper copy in the mail If you have any lab test that is abnormal or we need to change your treatment, we will call you to review the results.  Testing/Procedures: Your physician has requested that you have an echocardiogram. Echocardiography is a painless test that uses sound waves to create images of your heart. It provides your doctor with information about the size and shape of your heart and how well your heart's chambers and valves are working. This procedure takes approximately one hour. There are no restrictions for this procedure. Please do NOT wear cologne, perfume, aftershave, or lotions (deodorant is allowed). Please arrive 15 minutes prior to your appointment time.  Please note: We ask at that you not bring children with you during ultrasound (echo/ vascular) testing. Due to room size and safety concerns, children are not allowed in the ultrasound rooms during exams. Our front office staff cannot provide observation of children in our lobby area while testing is being conducted. An adult accompanying a patient to their appointment will only be allowed in the ultrasound room at the discretion of the ultrasound technician under special circumstances. We apologize for any inconvenience.   Follow-Up: At [facility], you and your health needs are our priority.  As part of our continuing mission to provide you with exceptional heart care, our providers are all part of one team.  This team includes your primary Cardiologist (physician) and Advanced Practice Providers or APPs (Physician Assistants and Nurse Practitioners) who  all work together to provide you with the care you need, when you need it.  Your next appointment:    12 months with Dr. Mona  We recommend signing up for the patient portal called MyChart.  Sign up information is provided on this After Visit Summary.  MyChart is used to connect with patients for Virtual Visits (Telemedicine).  Patients are able to view lab/test results, encounter notes, upcoming appointments, etc.  Non-urgent messages can be sent to your provider as well.   To learn more about what you can do with MyChart, go to [email or web address].

## 2024-02-20 LAB — NMR, LIPOPROFILE
Cholesterol, Total: 144 mg/dL (ref 100–199)
HDL Particle Number: 38.9 umol/L (ref >=30.5)
HDL-C: 62 mg/dL (ref >39)
LDL Particle Number: 829 nmol/L (ref <1000)
LDL Size: 20.2 nm — AB (ref >20.5)
LDL-C (NIH Calc): 61 mg/dL (ref 0–99)
LP-IR Score: 33 (ref <=45)
Small LDL Particle Number: 483 nmol/L (ref <=527)
Triglycerides: 117 mg/dL (ref 0–149)

## 2024-02-20 LAB — COMPREHENSIVE METABOLIC PANEL WITH GFR
ALT: 16 IU/L (ref 0–32)
AST: 19 IU/L (ref 0–40)
Albumin: 4.2 g/dL (ref 3.8–4.8)
Alkaline Phosphatase: 93 IU/L (ref 49–135)
BUN/Creatinine Ratio: 13 (ref 12–28)
BUN: 10 mg/dL (ref 8–27)
Bilirubin Total: 0.5 mg/dL (ref 0.0–1.2)
CO2: 23 mmol/L (ref 20–29)
Calcium: 9.7 mg/dL (ref 8.7–10.3)
Chloride: 106 mmol/L (ref 96–106)
Creatinine, Ser: 0.76 mg/dL (ref 0.57–1.00)
Globulin, Total: 2.1 g/dL (ref 1.5–4.5)
Glucose: 96 mg/dL (ref 70–99)
Potassium: 5.1 mmol/L (ref 3.5–5.2)
Sodium: 143 mmol/L (ref 134–144)
Total Protein: 6.3 g/dL (ref 6.0–8.5)
eGFR: 82 mL/min/1.73 (ref >59)

## 2024-02-20 LAB — LIPOPROTEIN A (LPA): Lipoprotein (a): <8.4 nmol/L (ref <75.0)

## 2024-02-22 ENCOUNTER — Ambulatory Visit: Payer: Self-pay | Admitting: Internal Medicine

## 2024-03-18 ENCOUNTER — Other Ambulatory Visit: Payer: Self-pay

## 2024-03-18 DIAGNOSIS — G8929 Other chronic pain: Secondary | ICD-10-CM

## 2024-03-26 ENCOUNTER — Telehealth: Payer: Self-pay | Admitting: Internal Medicine

## 2024-03-26 ENCOUNTER — Ambulatory Visit (HOSPITAL_COMMUNITY)
Admission: RE | Admit: 2024-03-26 | Discharge: 2024-03-26 | Disposition: A | Discharge status: Home / Self Care | Source: Ambulatory Visit | Attending: Internal Medicine | Admitting: Internal Medicine

## 2024-03-26 DIAGNOSIS — R0602 Shortness of breath: Secondary | ICD-10-CM | POA: Insufficient documentation

## 2024-03-26 LAB — ECHOCARDIOGRAM COMPLETE
Area-P 1/2: 3.68 cm2
S' Lateral: 2.2 cm

## 2024-03-26 NOTE — Telephone Encounter (Signed)
 Patient says when she takes her oxygen  tank to appointments she doesn't get much oxygen  and the office is usually able to provide extra oxygen . She would like to know if someone can get this for her for 2:05 PM echo this afternoon. Please advise.

## 2024-03-26 NOTE — Telephone Encounter (Signed)
 Informed patient when she checks in for echo appt she can request oxygen . Patient verbalized understanding and expressed appreciation for call.

## 2024-03-30 ENCOUNTER — Other Ambulatory Visit (HOSPITAL_BASED_OUTPATIENT_CLINIC_OR_DEPARTMENT_OTHER): Payer: Self-pay | Admitting: Internal Medicine

## 2024-03-30 ENCOUNTER — Other Ambulatory Visit: Payer: Self-pay

## 2024-04-08 ENCOUNTER — Ambulatory Visit (INDEPENDENT_AMBULATORY_CARE_PROVIDER_SITE_OTHER): Admitting: Adult Health

## 2024-04-08 ENCOUNTER — Encounter: Payer: Self-pay | Admitting: Adult Health

## 2024-04-08 VITALS — BP 137/88 | HR 85 | Temp 98.0°F | Ht 68.0 in | Wt 134.0 lb

## 2024-04-08 DIAGNOSIS — R5381 Other malaise: Secondary | ICD-10-CM | POA: Diagnosis not present

## 2024-04-08 DIAGNOSIS — J449 Chronic obstructive pulmonary disease, unspecified: Secondary | ICD-10-CM | POA: Diagnosis not present

## 2024-04-08 DIAGNOSIS — E43 Unspecified severe protein-calorie malnutrition: Secondary | ICD-10-CM

## 2024-04-08 DIAGNOSIS — J9611 Chronic respiratory failure with hypoxia: Secondary | ICD-10-CM

## 2024-04-08 DIAGNOSIS — I272 Pulmonary hypertension, unspecified: Secondary | ICD-10-CM

## 2024-04-08 NOTE — Progress Notes (Signed)
 @Patient  ID: Vickie Bowman, female    DOB: 1950/12/14, 73 y.o.   MRN: [id number]  Chief Complaint  Patient presents with   COPD    F/U    Referring provider: Gayle Saddie JULIANNA DEVONNA  HPI: 73 year old female former smoker followed for COPD with emphysema, chronic hypoxic respiratory failure and pulmonary hypertension Participates in the lung cancer CT chest screening program    TEST/EVENTS : Reviewed 04/08/2024  CT chest-low-dose Oct 02, 2023 lung RADS 2 with benign appearance, severe emphysema stable, stable complete collapse of the right middle lobe, Stable 1.1 mm left upper lobe nodule.   2 D echo EF 60-65%, Gr 1 DD , Mildly elevated pulmonary hypertension -  Discussed the use of AI scribe software for clinical note transcription with the patient, who gave verbal consent to proceed.  History of Present Illness Vickie Bowman is a 73 year old female with COPD and emphysema who presents for a checkup regarding her breathing and oxygen  use.    Overall feels that her breathing is doing about the same but does feel that has gradually been getting worse as she gets winded with minimal activities , particularly during household chores like washing dishes, necessitating breaks. She uses Breztri  and a nebulizer with Otuvayre Twice daily  and albuterol  as needed, though the nebulizer provides minimal relief.  Does not feel that is made much difference in her overall activity level.  We discussed pulmonary rehab but she would like to hold off at this time.  She remains on oxygen  2 L 24/7.  Says that she had a exacerbation couple months ago and took a course of antibiotics with improvement.  She is followed by cardiology for congestive heart failure, nonobstructive coronary disease.  Recent echo November 4 shows preserved EF, grade 1 diastolic dysfunction and mild pulmonary hypertension with pulmonary artery pressure at 42 mmHg.  She lives with her brother, who is currently out of town.  She drives but prefers not to go out alone due to anxiety and breathlessness. She has a history of anxiety and panic attacks, previously managed with Xanax , but her new primary doctor has switched her to lower dose at 0.5 mg, which she feels is less effective.       Allergies  Allergen Reactions   Penicillins Swelling    Has patient had a PCN reaction causing immediate rash, facial/tongue/throat swelling, SOB or lightheadedness with hypotension: Yes Has patient had a PCN reaction causing severe rash involving mucus membranes or skin necrosis: No Has patient had a PCN reaction that required hospitalization: No Has patient had a PCN reaction occurring within the last 10 years: No If all of the above answers are NO, then may proceed with Cephalosporin use.     Immunization History  Administered Date(s) Administered   Fluad Quad(high Dose 65+) 02/22/2019, 03/19/2021, 02/15/2022   INFLUENZA, HIGH DOSE SEASONAL PF 02/08/2023   Influenza-Unspecified 03/21/2018   PFIZER(Purple Top)SARS-COV-2 Vaccination 11/23/2019, 11/30/2019, 12/21/2019   Pneumococcal Polysaccharide-23 06/21/2018   Tdap 11/24/2021   Zoster Recombinant(Shingrix) 10/23/2023    Past Medical History:  Diagnosis Date   Anxiety    CHF (congestive heart failure) (HCC)    COPD (chronic obstructive pulmonary disease) (HCC)    Lumbar herniated disc    Tobacco abuse     Tobacco History: Social History   Tobacco Use  Smoking Status Former   Current packs/day: 0.00   Average packs/day: 1.5 packs/day for 53.0 years (79.5 ttl pk-yrs)   Types: Cigarettes  Start date: 05/24/1967   Quit date: 01/07/2018   Years since quitting: 6.2   Passive exposure: Current  Smokeless Tobacco Never  Tobacco Comments   Smoked since age 9, quit 12/2017   Counseling given: Not Answered Tobacco comments: Smoked since age 78, quit 12/2017   Outpatient Medications Prior to Visit  Medication Sig Dispense Refill   albuterol  (PROVENTIL )  (2.5 MG/3ML) 0.083% nebulizer solution USE 1 VIAL IN NEBULIZER EVERY 6 HOURS AS NEEDED FOR WHEEZING FOR SHORTNESS OF BREATH 90 mL 1   albuterol  (VENTOLIN  HFA) 108 (90 Base) MCG/ACT inhaler Inhale 2 puffs into the lungs every 6 (six) hours as needed for wheezing or shortness of breath. 8 g 6   ALPRAZolam  (XANAX ) 0.5 MG tablet Take 1 tablet by mouth twice daily as needed for anxiety 60 tablet 0   aspirin  EC 81 MG EC tablet Take 1 tablet (81 mg total) by mouth daily. 30 tablet 0   BREZTRI  AEROSPHERE 160-9-4.8 MCG/ACT AERO inhaler INHALE 2 PUFFS IN THE MORNING AND AT BEDTIME 11 g 0   Cholecalciferol (VITAMIN D ) 2000 units CAPS Take 1 capsule (2,000 Units total) by mouth daily. 30 capsule 5   Dextromethorphan  HBr 15 MG CAPS Take 2 capsules (30 mg total) by mouth 3 (three) times daily as needed (cough). 28 capsule 0   diclofenac  Sodium (VOLTAREN ) 1 % GEL Apply 2 g topically 4 (four) times daily. To affected joint. 100 g 11   DULoxetine  (CYMBALTA ) 30 MG capsule Take 1 capsule (30 mg total) by mouth daily. 90 capsule 3   DULoxetine  (CYMBALTA ) 60 MG capsule Take 1 capsule (60 mg total) by mouth daily. 90 capsule 3   Ensifentrine  (OHTUVAYRE ) 3 MG/2.5ML SUSP Inhale 1 Inhalation into the lungs in the morning and at bedtime.     HYDROcodone -acetaminophen  (NORCO/VICODIN) 5-325 MG tablet Take 1 tablet by mouth 2 (two) times daily as needed for severe pain (pain score 7-10). 30 tablet 0   hydrOXYzine  (VISTARIL ) 25 MG capsule Take 1 capsule (25 mg total) by mouth every 8 (eight) hours as needed. 30 capsule 0   ipratropium-albuterol  (DUONEB) 0.5-2.5 (3) MG/3ML SOLN Take 3 mLs by nebulization every 6 (six) hours as needed. 90 mL 3   meloxicam  (MOBIC ) 7.5 MG tablet Take 1 tablet by mouth once daily 90 tablet 0   metoprolol  succinate (TOPROL -XL) 25 MG 24 hr tablet Take 0.5 tablets (12.5 mg total) by mouth daily. 90 tablet 1   rosuvastatin  (CRESTOR ) 20 MG tablet Take 1 tablet by mouth once daily 90 tablet 3    Spacer/Aero-Holding Chambers (AEROCHAMBER MV) inhaler Use as instructed 1 each 0   ketoconazole  (NIZORAL ) 2 % cream Apply 1 Application topically daily. (Patient not taking: Reported on 04/08/2024) 60 g 2   pregabalin  (LYRICA ) 25 MG capsule Take 1 capsule by mouth twice daily (Patient not taking: Reported on 04/08/2024) 60 capsule 5   doxycycline  (VIBRA -TABS) 100 MG tablet Take 1 tablet (100 mg total) by mouth 2 (two) times daily. 20 tablet 0   predniSONE  (STERAPRED UNI-PAK 21 TAB) 10 MG (21) TBPK tablet Use as directed. (Patient not taking: Reported on 04/08/2024) 21 each 0   No facility-administered medications prior to visit.     Review of Systems:   Constitutional:   No  weight loss, night sweats,  Fevers, chills, +fatigue, or  lassitude.  HEENT:   No headaches,  Difficulty swallowing,  Tooth/dental problems, or  Sore throat,  No sneezing, itching, ear ache, nasal congestion, post nasal drip,   CV:  No chest pain,  Orthopnea, PND, swelling in lower extremities, anasarca, dizziness, palpitations, syncope.   GI  No heartburn, indigestion, abdominal pain, nausea, vomiting, diarrhea, change in bowel habits, loss of appetite, bloody stools.   Resp: .  No chest wall deformity  Skin: no rash or lesions.  GU: no dysuria, change in color of urine, no urgency or frequency.  No flank pain, no hematuria   MS:  No joint pain or swelling.  No decreased range of motion.  No back pain.    Physical Exam  BP (!) 171/97   Pulse 85   Temp 98 F (36.7 C)   Ht 5' 8 (1.727 m) Comment: Per pt  Wt 134 lb (60.8 kg)   SpO2 91% Comment: 2L Cont o2  BMI 20.37 kg/m   GEN: A/Ox3; pleasant , NAD, frail, elderly, oxygen    HEENT:  [place]/AT,   NOSE-clear, THROAT-clear, no lesions, no postnasal drip or exudate noted.   NECK:  Supple w/ fair ROM; no JVD; normal carotid impulses w/o bruits; no thyromegaly or nodules palpated; no lymphadenopathy.    RESP few trace rhonchi  no accessory  muscle use, no dullness to percussion  CARD:  RRR, no m/r/g, no peripheral edema, pulses intact, no cyanosis or clubbing.  GI:   Soft & nt; nml bowel sounds; no organomegaly or masses detected.   Musco: Warm bil, no deformities or joint swelling noted.   Neuro: alert, no focal deficits noted.    Skin: Warm, no lesions or rashes    Lab Results:Reviewed 04/08/2024   CBC    Component Value Date/Time   WBC 6.4 02/15/2023 1015   WBC 5.7 03/05/2018 0327   RBC 4.49 02/15/2023 1015   RBC 4.61 03/05/2018 0327   HGB 13.7 02/15/2023 1015   HCT 42.3 02/15/2023 1015   PLT 165 02/15/2023 1015   MCV 94 02/15/2023 1015   MCH 30.5 02/15/2023 1015   MCH 30.4 03/05/2018 0327   MCHC 32.4 02/15/2023 1015   MCHC 31.5 03/05/2018 0327   RDW 12.8 02/15/2023 1015   LYMPHSABS 1.3 02/15/2023 1015   MONOABS 0.7 03/04/2018 2146   EOSABS 0.2 02/15/2023 1015   BASOSABS 0.1 02/15/2023 1015    BMET    Component Value Date/Time   NA 143 02/19/2024 1455   K 5.1 02/19/2024 1455   CL 106 02/19/2024 1455   CO2 23 02/19/2024 1455   GLUCOSE 96 02/19/2024 1455   GLUCOSE 154 (H) 03/05/2018 0327   BUN 10 02/19/2024 1455   CREATININE 0.76 02/19/2024 1455   CALCIUM  9.7 02/19/2024 1455   GFRNONAA 69 07/01/2020 1430   GFRAA 79 07/01/2020 1430    BNP    Component Value Date/Time   BNP 41.8 07/01/2020 1430   BNP 12.8 03/04/2018 2146    ProBNP No results found for: PROBNP  Imaging: ECHOCARDIOGRAM COMPLETE Result Date: 03/26/2024    ECHOCARDIOGRAM REPORT   Patient Name:   DENVER HARDER Date of Exam: 03/26/2024 Medical Rec #:  [id number]       Height:       68.0 in Accession #:    [id number]      Weight:       136.0 lb Date of Birth:  1950-06-23       BSA:          1.735 m Patient Age:    73 years  BP:           106/72 mmHg Patient Gender: F               HR:           72 bpm. Exam Location:  [address] Procedure: 2D Echo, 3D Echo, Cardiac Doppler and Color Doppler (Both Spectral             and Color Flow Doppler were utilized during procedure). Indications:    R06.00 Dyspnea  History:        Patient has prior history of Echocardiogram examinations, most                 recent 05/30/2018. CHF, CAD, COPD, Signs/Symptoms:Dyspnea; Risk                 Factors:Former Smoker and Dyslipidemia.  Sonographer:    Heather Hawks RDCS Referring Phys: VINIE BROCKS HILTY IMPRESSIONS  1. Left ventricular ejection fraction, by estimation, is 60 to 65%. The left ventricle has normal function. The left ventricle has no regional wall motion abnormalities. Left ventricular diastolic parameters are consistent with Grade I diastolic dysfunction (impaired relaxation).  2. Right ventricular systolic function is mildly reduced. The right ventricular size is normal. There is mildly elevated pulmonary artery systolic pressure. The estimated right ventricular systolic pressure is 42.4 mmHg.  3. The mitral valve is normal in structure. Trivial mitral valve regurgitation. No evidence of mitral stenosis.  4. Tricuspid valve regurgitation is moderate.  5. The aortic valve is normal in structure. Aortic valve regurgitation is not visualized. No aortic stenosis is present.  6. The inferior vena cava is normal in size with greater than 50% respiratory variability, suggesting right atrial pressure of 3 mmHg. FINDINGS  Left Ventricle: Left ventricular ejection fraction, by estimation, is 60 to 65%. The left ventricle has normal function. The left ventricle has no regional wall motion abnormalities. 3D ejection fraction reviewed and evaluated as part of the interpretation. Alternate measurement of EF is felt to be most reflective of LV function. The left ventricular internal cavity size was normal in size. There is no left ventricular hypertrophy. Left ventricular diastolic parameters are consistent with Grade I diastolic dysfunction (impaired relaxation). Normal left ventricular filling pressure. Right Ventricle: The right ventricular size  is normal. No increase in right ventricular wall thickness. Right ventricular systolic function is mildly reduced. There is mildly elevated pulmonary artery systolic pressure. The tricuspid regurgitant velocity  is 3.14 m/s, and with an assumed right atrial pressure of 3 mmHg, the estimated right ventricular systolic pressure is 42.4 mmHg. Left Atrium: Left atrial size was normal in size. Right Atrium: Right atrial size was normal in size. Pericardium: There is no evidence of pericardial effusion. Mitral Valve: The mitral valve is normal in structure. Trivial mitral valve regurgitation. No evidence of mitral valve stenosis. Tricuspid Valve: The tricuspid valve is normal in structure. Tricuspid valve regurgitation is moderate . No evidence of tricuspid stenosis. Aortic Valve: The aortic valve is normal in structure. Aortic valve regurgitation is not visualized. No aortic stenosis is present. Pulmonic Valve: The pulmonic valve was normal in structure. Pulmonic valve regurgitation is trivial. No evidence of pulmonic stenosis. Aorta: The aortic root is normal in size and structure. Venous: The inferior vena cava is normal in size with greater than 50% respiratory variability, suggesting right atrial pressure of 3 mmHg. IAS/Shunts: No atrial level shunt detected by color flow Doppler.  LEFT VENTRICLE PLAX 2D LVIDd:  3.90 cm   Diastology LVIDs:         2.20 cm   LV e' medial:    6.82 cm/s LV PW:         0.80 cm   LV E/e' medial:  8.0 LV IVS:        0.90 cm   LV e' lateral:   7.51 cm/s LVOT diam:     2.30 cm   LV E/e' lateral: 7.2 LV SV:         62 LV SV Index:   36 LVOT Area:     4.15 cm LV IVRT:       93 msec                          3D Volume EF:                          3D EF:        74 %                          LV EDV:       61 ml                          LV ESV:       16 ml                          LV SV:        45 ml RIGHT VENTRICLE             IVC RV Basal diam:  2.85 cm     IVC diam: 0.80 cm RV S prime:      11.60 cm/s TAPSE (M-mode): 1.6 cm      PULMONARY VEINS RVSP:           47.4 mmHg   Diastolic Velocity: 58.50 cm/s                             S/D Velocity:       1.50                             Systolic Velocity:  89.90 cm/s LEFT ATRIUM             Index        RIGHT ATRIUM           Index LA diam:        3.00 cm 1.73 cm/m   RA Pressure: 8.00 mmHg LA Vol (A2C):   40.5 ml 23.35 ml/m  RA Area:     11.80 cm LA Vol (A4C):   26.8 ml 15.45 ml/m  RA Volume:   29.10 ml  16.77 ml/m LA Biplane Vol: 32.9 ml 18.97 ml/m  AORTIC VALVE LVOT Vmax:   82.70 cm/s LVOT Vmean:  51.300 cm/s LVOT VTI:    0.150 m  AORTA Ao Root diam: 2.90 cm Ao Asc diam:  3.30 cm MITRAL VALVE               TRICUSPID VALVE MV Area (PHT)  cm         TR Peak grad:   39.4 mmHg MV Decel Time: 206 msec  TR Vmax:        314.00 cm/s MV E velocity: 54.23 cm/s  Estimated RAP:  8.00 mmHg MV A velocity: 96.97 cm/s  RVSP:           47.4 mmHg MV E/A ratio:  0.56                            SHUNTS                            Systemic VTI:  0.15 m                            Systemic Diam: 2.30 cm Wilbert Bihari MD Electronically signed by Wilbert Bihari MD Signature Date/Time: 03/26/2024/7:16:33 PM    Final     Administration History     None          Latest Ref Rng & Units 03/30/2018    3:51 PM  PFT Results  FVC-Pre L 2.11   FVC-Predicted Pre % 100   FVC-Post L 2.31   FVC-Predicted Post % 109   Pre FEV1/FVC % % 41   Post FEV1/FCV % % 48   FEV1-Pre L 0.87   FEV1-Predicted Pre % 55   FEV1-Post L 1.10   DLCO uncorrected ml/min/mmHg 10.54   DLCO UNC% % 78   DLVA Predicted % 59   TLC L 8.24   TLC % Predicted % 213   RV % Predicted % 341     No results found for: NITRICOXIDE      No data to display              Assessment & Plan:   Assessment and Plan Assessment & Plan Chronic obstructive pulmonary disease (COPD) with emphysema and chronic hypoxemic respiratory failure requiring supplemental oxygen  -stable but has  significant symptom burden COPD with emphysema with significant symptom burden shortness of breath, decreased activity tolerance and endurance . Current treatment includes Breztri  and Otuvayre Twice daily   with albuterol  available for acute symptoms. She declined pulmonary rehabilitation. Discussed potential genetic predisposition to emphysema and offered alpha-1 antitrypsin deficiency testing.  Ordered alpha-1 antitrypsin deficiency test and scheduled a pulmonary function test for February. Encouraged pulmonary rehabilitation if interested in the future.    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 A recent echocardiogram shows preserved EF, grade 1 diastolic dysfunction  Continue on oxygen  to maintain O2 saturations greater than 90% no evidence of volume overload.  Chronic respiratory failure continue on oxygen  to maintain O2 saturations greater than 88 to 90%-stable Continue on O2 at 2 L.  Physical deconditioning-progressive decline Continue activity as tolerated.  Declines pulmonary rehab at this time  Protein calorie malnutrition mod -severe BMI 20-weight is stable.  Continue with high-protein diet.  Plan  Patient Instructions  Continue on Breztri  2 puffs Twice daily, rinse after use.  Albuterol  inhaler or neb As needed  Continue on Ohtuvayre  neb Twice daily   Activity as tolerated Continue on Oxygen  2l/m.  Call back if you change your mind on pulmonary rehab.  Labs today.  You have pulmonary hypertension (elevated pressure in the pulmonary artery) most like due to your COPD/Emphysema this can cause shortness of breath as well.  RSV vaccine this fall as discussed  Follow up with Dr. Annella and Jamayah Myszka NP in 3 months and As needed -with PFT          Madelin Stank, NP 04/08/2024  I spent   minutes dedicated to the care of this patient on  the date of this encounter to include pre-visit review of records, face-to-face time with the patient discussing conditions above, post visit ordering of testing, clinical documentation with the electronic health record, making appropriate referrals as documented, and communicating necessary findings to members of the patients care team.

## 2024-04-08 NOTE — Patient Instructions (Addendum)
 Continue on Breztri  2 puffs Twice daily, rinse after use.  Albuterol  inhaler or neb As needed  Continue on Ohtuvayre  neb Twice daily   Activity as tolerated Continue on Oxygen  2l/m.  Call back if you change your mind on pulmonary rehab.  Labs today.  You have pulmonary hypertension (elevated pressure in the pulmonary artery) most like due to your COPD/Emphysema this can cause shortness of breath as well.  RSV vaccine this fall as discussed  Follow up with Dr. Annella and Keeana Pieratt NP in 3 months and As needed -with PFT

## 2024-04-10 ENCOUNTER — Encounter: Payer: Self-pay | Admitting: Podiatry

## 2024-04-10 ENCOUNTER — Ambulatory Visit (INDEPENDENT_AMBULATORY_CARE_PROVIDER_SITE_OTHER): Admitting: Podiatry

## 2024-04-10 DIAGNOSIS — M79674 Pain in right toe(s): Secondary | ICD-10-CM

## 2024-04-10 DIAGNOSIS — B351 Tinea unguium: Secondary | ICD-10-CM | POA: Diagnosis not present

## 2024-04-10 DIAGNOSIS — G629 Polyneuropathy, unspecified: Secondary | ICD-10-CM

## 2024-04-10 DIAGNOSIS — M79675 Pain in left toe(s): Secondary | ICD-10-CM

## 2024-04-11 ENCOUNTER — Telehealth: Payer: Self-pay

## 2024-04-11 ENCOUNTER — Telehealth (INDEPENDENT_AMBULATORY_CARE_PROVIDER_SITE_OTHER): Admitting: Podiatry

## 2024-04-11 NOTE — Telephone Encounter (Signed)
 Copied from CRM #[id number]. Topic: Clinical - Lab/Test Results >> Apr 11, 2024  3:01 PM Celestine FALCON wrote: Reason for CRM: Pt would like the results of her Alpha-1 antitrypsin phenotype (Order [id number]) when it is available by phone call at [phone].   Called and spoke to pt - advised pt that the results are not yet available, but that we will let her know when they are ready. Pt verbalized understanding, NFN.

## 2024-04-11 NOTE — Telephone Encounter (Signed)
 Regarding yesterday's visit. Patient states her right great toe was nicked and bled on yesterday. It was cleansed with alcohol. She states she would only like her toenails trimmed by me. I advised her to apply Neosporin once daily and call me if she has any problems. She related understanding. Blue sticky notice posted to only see me for her nails. She was pleased with outcome and plan for visits to only see me going forward.

## 2024-04-11 NOTE — Telephone Encounter (Signed)
 Patient called and would like to speak with Dr. Gaynel. Patient would not provide further detail as to what it was about.

## 2024-04-13 LAB — ALPHA-1 ANTITRYPSIN PHENOTYPE: A-1 Antitrypsin, Ser: 164 mg/dL (ref 83–199)

## 2024-04-16 ENCOUNTER — Ambulatory Visit: Payer: Self-pay | Admitting: Adult Health

## 2024-04-17 NOTE — Progress Notes (Signed)
Called and spoke with patient, provided results per Rexene Edison NP.  She verbalized understanding. Nothing further needed.

## 2024-04-19 ENCOUNTER — Encounter: Payer: Self-pay | Admitting: Podiatry

## 2024-04-19 NOTE — Progress Notes (Signed)
  Subjective:  Patient ID: Vickie Bowman, female    DOB: 1951/04/21,  MRN: [id number]  Vickie Bowman presents to clinic today for at risk foot care with history of peripheral neuropathy and painful mycotic toenails of both feet that are difficult to trim. Pain interferes with daily activities and wearing enclosed shoe gear comfortably.  Chief Complaint  Patient presents with   RFC     RFC Non diabetic toenail trim. LOV with PCP 01/16/24.   New problem(s): None.   PCP is Gayle Saddie FALCON, PA-C.  Allergies  Allergen Reactions   Penicillins Swelling    Has patient had a PCN reaction causing immediate rash, facial/tongue/throat swelling, SOB or lightheadedness with hypotension: Yes Has patient had a PCN reaction causing severe rash involving mucus membranes or skin necrosis: No Has patient had a PCN reaction that required hospitalization: No Has patient had a PCN reaction occurring within the last 10 years: No If all of the above answers are NO, then may proceed with Cephalosporin use.     Review of Systems: Negative except as noted in the HPI.  Objective: No changes noted in today's physical examination. There were no vitals filed for this visit. Vickie Bowman is a pleasant 73 y.o. female WD, WN in NAD. AAO x 3. On supplemental oxygen .  Vascular Examination: Capillary refill time immediate b/l. Vascular status intact b/l with palpable pedal pulses. Pedal hair absent b/l. No pain with calf compression b/l. Skin temperature gradient WNL b/l. No cyanosis or clubbing b/l. No ischemia or gangrene noted b/l. No varicosities noted.  Neurological Examination: Sensation grossly intact b/l with 10 gram monofilament. Vibratory sensation intact b/l. Pt has subjective symptoms of neuropathy.  Dermatological Examination: Pedal skin with normal turgor, texture and tone b/l.  No open wounds. No interdigital macerations.   Toenails 1-5 b/l thick, discolored, elongated with subungual debris and  pain on dorsal palpation.   Pedal skin is warm and supple b/l LE. No open wounds b/l LE. No interdigital macerations noted b/l LE. Toenails 1-5 b/l elongated, discolored, dystrophic, thickened, crumbly with subungual debris and tenderness to dorsal palpation.  Musculoskeletal Examination: Muscle strength 5/5 to all lower extremity muscle groups bilaterally. No pain, crepitus or joint limitation noted with ROM bilateral LE. HAV with bunion deformity noted b/l LE. Hammertoe(s) right second digit.  Radiographs: None  Assessment/Plan: 1. Pain due to onychomycosis of toenails of both feet   2. Polyneuropathy   Patient was evaluated and treated. All patient's and/or POA's questions/concerns addressed on today's visit. Mycotic toenails 1-5 b/l debrided in length and girth. Treatment was provided by assistant Andrez Manchester under my supervision. Continue soft, supportive shoe gear daily. Report any pedal injuries to medical professional. Call office if there are any quesitons/concerns. Return in about 3 months (around 07/11/2024).  Delon LITTIE Merlin, DPM      [place] LOCATION: [address] [place] [address]                   Office [phone]   [facility] LOCATION: [address] [place], [place] [address] Office [phone]

## 2024-04-25 ENCOUNTER — Telehealth: Payer: Self-pay | Admitting: *Deleted

## 2024-04-25 NOTE — Telephone Encounter (Signed)
 Copied from CRM #[id number]. Topic: Clinical - Medication Question >> Apr 22, 2024 12:39 PM Russell PARAS wrote: Reason for CRM:   Pt is contacting clinic regarding a new medication she is interested in trying. She would like to know Parrett's thoughts on this medication and if it would be appropriate for her, the med name is Nucala  CB#  [phone]  I called and spoke with the pt  She is asking about Nucala- seen ad on tv and she wondered if this would be a good option for her. Tammy, please advise, thanks!

## 2024-04-26 ENCOUNTER — Other Ambulatory Visit: Payer: Self-pay

## 2024-04-29 NOTE — Telephone Encounter (Signed)
 Yes it is an option for her, we can discuss at next ov and get labs if needed.

## 2024-04-30 ENCOUNTER — Other Ambulatory Visit: Payer: Self-pay

## 2024-04-30 DIAGNOSIS — J449 Chronic obstructive pulmonary disease, unspecified: Secondary | ICD-10-CM

## 2024-05-01 NOTE — Telephone Encounter (Signed)
 Pt aware.

## 2024-05-25 ENCOUNTER — Other Ambulatory Visit: Payer: Self-pay | Admitting: Neurology

## 2024-05-25 DIAGNOSIS — G6289 Other specified polyneuropathies: Secondary | ICD-10-CM

## 2024-05-29 ENCOUNTER — Other Ambulatory Visit: Payer: Self-pay

## 2024-05-30 ENCOUNTER — Ambulatory Visit: Payer: 59 | Admitting: Neurology

## 2024-05-30 ENCOUNTER — Encounter: Payer: Self-pay | Admitting: Neurology

## 2024-05-30 ENCOUNTER — Ambulatory Visit: Payer: Self-pay

## 2024-05-30 DIAGNOSIS — R051 Acute cough: Secondary | ICD-10-CM

## 2024-05-30 DIAGNOSIS — G629 Polyneuropathy, unspecified: Secondary | ICD-10-CM | POA: Diagnosis not present

## 2024-05-30 MED ORDER — DEXTROMETHORPHAN HBR 15 MG PO CAPS: 30.0000 mg | ORAL_CAPSULE | Freq: Three times a day (TID) | ORAL | 0 refills | Status: DC | PRN

## 2024-05-30 MED ORDER — PREGABALIN 25 MG PO CAPS: 25.0000 mg | ORAL_CAPSULE | Freq: Two times a day (BID) | ORAL | 5 refills | Status: AC

## 2024-05-30 MED ORDER — DULOXETINE HCL 30 MG PO CPEP: 30.0000 mg | ORAL_CAPSULE | Freq: Every day | ORAL | 3 refills | Status: AC

## 2024-05-30 MED ORDER — DULOXETINE HCL 60 MG PO CPEP: 60.0000 mg | ORAL_CAPSULE | Freq: Every day | ORAL | 3 refills | Status: AC

## 2024-05-30 NOTE — Progress Notes (Signed)
 "   [facility]  PATIENT: Vickie Bowman DOB: [birth year]  REQUESTING CLINICIAN: Wallace Joesph LABOR, PA HISTORY FROM: Patient  REASON FOR VISIT: Bilateral feet and hands numbness    HISTORICAL  CHIEF COMPLAINT:  Chief Complaint  Patient presents with   Follow-up    Room 13 Alone Polyneuropathy   INTERVAL HISTORY 05/30/2024 Patient presents today for follow up, she is alone. Last visit was a year ago, since then she has been doing well from a neuropathy standpoint.  She is compliant with her duloxetine  90 mg daily and pregabalin  25 mg twice daily.  Her main complaint today is her pulmonary status and additional cough.  She tells me that she is supposed to see her PCP on Monday and her pulmonologist in February.  Again she reports that her neuropathic pain is well-controlled with a combination of pregabalin  and duloxetine .   INTERVAL HISTORY 05/31/2023: Patient presents today for follow-up, she is alone.  Last visit was in June, at that time we added pregabalin  to her duloxetine  90 mg daily for neuropathy.  She tells me that the Pregabalin  has helped her tremendously.  Her pain is now better controlled.  Currently she does not have any additional questions or concern in terms of the neuropathy.   INTERVAL HISTORY 11/09/2022:  Patient presents today for follow-up, she is alone.  At last visit in December we had increased her duloxetine  to 90 mg daily.  She reports the increasing in duloxetine  has helped her neuropathy but she still experiencing some pain. Other than that she reports she is doing well, at time she will try the Aspercreme.  She has recently follow-up with the pulmonology and cardiology and they told her that everything is stable.  No other complaints.   INTERVAL HISTORY 05/03/2022: Patient presents today for follow-up, at last visit we tried to prescribe pregabalin  but she did not tolerate the side effect of being drowsy.  We also started her on a lidocaine   patch (ZTLido ) but she reports that it was not helpful.  She feels that the duloxetine  has been helpful to decrease her neuropathic pain and would like to increase on the medication.  Otherwise she is doing well no other complaints.   INTERVAL HISTORY 10/26/21:  Patient presents today for follow-up, last visit was in December.  At that time plan was to start duloxetine  60 mg daily.  She is compliant with the medication, states that it helps with the neuropathy but she still feels the pain mostly at night.  She has not tried any additional medication for it.  Again pain is described as tingling and burning with constant numbness in both feet and hands.  Sample given to patient Ztlido    HISTORY OF PRESENT ILLNESS:  This is a 74 year old woman with past medical history of anxiety and panic attack, COPD on additional oxygen  who is presenting with numbness in both feet and hands.  Patient stated numbness started in the past 70-month, she notices more when going to bed.  Denies any pain in the lower extremities, denies any back pain and denies any shooting pain.  She reported trying gabapentin in the past without benefit. Currently she is not on medication for numbness. Denies any history of diabetes.  Denies any falls.     OTHER MEDICAL CONDITIONS: Anxiety, COPD, Panic attack   REVIEW OF SYSTEMS: Full 14 system review of systems performed and negative with exception of: as noted in the HPI   ALLERGIES: Allergies  Allergen Reactions  Penicillins Swelling    Has patient had a PCN reaction causing immediate rash, facial/tongue/throat swelling, SOB or lightheadedness with hypotension: Yes Has patient had a PCN reaction causing severe rash involving mucus membranes or skin necrosis: No Has patient had a PCN reaction that required hospitalization: No Has patient had a PCN reaction occurring within the last 10 years: No If all of the above answers are NO, then may proceed with Cephalosporin use.      HOME MEDICATIONS: Outpatient Medications Prior to Visit  Medication Sig Dispense Refill   albuterol  (PROVENTIL ) (2.5 MG/3ML) 0.083% nebulizer solution USE 1 VIAL IN NEBULIZER EVERY 6 HOURS AS NEEDED FOR WHEEZING FOR SHORTNESS OF BREATH 90 mL 1   albuterol  (VENTOLIN  HFA) 108 (90 Base) MCG/ACT inhaler Inhale 2 puffs into the lungs every 6 (six) hours as needed for wheezing or shortness of breath. 8 g 6   ALPRAZolam  (XANAX ) 0.5 MG tablet Take 1 tablet by mouth twice daily as needed for anxiety 60 tablet 0   aspirin  EC 81 MG EC tablet Take 1 tablet (81 mg total) by mouth daily. 30 tablet 0   BREZTRI  AEROSPHERE 160-9-4.8 MCG/ACT AERO inhaler INHALE 2 PUFFS IN THE MORNING AND AT BEDTIME 11 g 0   Cholecalciferol (VITAMIN D ) 2000 units CAPS Take 1 capsule (2,000 Units total) by mouth daily. 30 capsule 5   Ensifentrine  (OHTUVAYRE ) 3 MG/2.5ML SUSP Inhale 1 Inhalation into the lungs in the morning and at bedtime.     HYDROcodone -acetaminophen  (NORCO/VICODIN) 5-325 MG tablet Take 1 tablet by mouth 2 (two) times daily as needed for severe pain (pain score 7-10). 30 tablet 0   hydrOXYzine  (VISTARIL ) 25 MG capsule Take 1 capsule (25 mg total) by mouth every 8 (eight) hours as needed. 30 capsule 0   ipratropium-albuterol  (DUONEB) 0.5-2.5 (3) MG/3ML SOLN Take 3 mLs by nebulization every 6 (six) hours as needed. 90 mL 3   ketoconazole  (NIZORAL ) 2 % cream Apply 1 Application topically daily. 60 g 2   meloxicam  (MOBIC ) 7.5 MG tablet Take 1 tablet by mouth once daily 90 tablet 0   metoprolol  succinate (TOPROL -XL) 25 MG 24 hr tablet Take 0.5 tablets (12.5 mg total) by mouth daily. 90 tablet 1   rosuvastatin  (CRESTOR ) 20 MG tablet Take 1 tablet by mouth once daily 90 tablet 3   Spacer/Aero-Holding Chambers (AEROCHAMBER MV) inhaler Use as instructed 1 each 0   DULoxetine  (CYMBALTA ) 30 MG capsule Take 1 capsule (30 mg total) by mouth daily. 90 capsule 3   DULoxetine  (CYMBALTA ) 60 MG capsule Take 1 capsule by mouth  once daily 90 capsule 0   pregabalin  (LYRICA ) 25 MG capsule Take 1 capsule by mouth twice daily 60 capsule 5   Dextromethorphan  HBr 15 MG CAPS Take 2 capsules (30 mg total) by mouth 3 (three) times daily as needed (cough). (Patient not taking: Reported on 05/30/2024) 28 capsule 0   diclofenac  Sodium (VOLTAREN ) 1 % GEL Apply 2 g topically 4 (four) times daily. To affected joint. (Patient not taking: Reported on 05/30/2024) 100 g 11   No facility-administered medications prior to visit.    PAST MEDICAL HISTORY: Past Medical History:  Diagnosis Date   Anxiety    CHF (congestive heart failure) (HCC)    COPD (chronic obstructive pulmonary disease) (HCC)    Lumbar herniated disc    Tobacco abuse     PAST SURGICAL HISTORY: Past Surgical History:  Procedure Laterality Date   LEFT HEART CATH AND CORONARY ANGIOGRAPHY N/A 02/05/2018  Procedure: LEFT HEART CATH AND CORONARY ANGIOGRAPHY;  Surgeon: Mady Bruckner, MD;  Location: [facility];  Service: Cardiovascular;  Laterality: N/A;    FAMILY HISTORY: Family History  Problem Relation Age of Onset   Liver cancer Mother    Breast cancer Sister    CAD Neg Hx     SOCIAL HISTORY: Social History   Socioeconomic History   Marital status: Widowed    Spouse name: Not on file   Number of children: Not on file   Years of education: Not on file   Highest education level: Not on file  Occupational History   Not on file  Tobacco Use   Smoking status: Former    Current packs/day: 0.00    Average packs/day: 1.5 packs/day for 53.0 years (79.5 ttl pk-yrs)    Types: Cigarettes    Start date: 05/24/1967    Quit date: 01/07/2018    Years since quitting: 6.3    Passive exposure: Current   Smokeless tobacco: Never   Tobacco comments:    Smoked since age 59, quit 12/2017  Vaping Use   Vaping status: Never Used  Substance and Sexual Activity   Alcohol use: Not Currently    Comment: Rare, glass of wine no more than every few weeks   Drug use:  Yes    Types: Hydrocodone    Sexual activity: Not Currently  Other Topics Concern   Not on file  Social History Narrative   Right handed   Lives with brother   Caffeine 1 cup coffee   Social Drivers of Health   Tobacco Use: Medium Risk (05/30/2024)   Patient History    Smoking Tobacco Use: Former    Smokeless Tobacco Use: Never    Passive Exposure: Current  Physicist, Medical Strain: Low Risk (11/02/2023)   Overall Financial Resource Strain (CARDIA)    Difficulty of Paying Living Expenses: Not hard at all  Food Insecurity: No Food Insecurity (11/02/2023)   Epic    Worried About Programme Researcher, Broadcasting/film/video in the Last Year: Never true    Ran Out of Food in the Last Year: Never true  Transportation Needs: No Transportation Needs (11/02/2023)   Epic    Lack of Transportation (Medical): No    Lack of Transportation (Non-Medical): No  Physical Activity: Inactive (11/02/2023)   Exercise Vital Sign    Days of Exercise per Week: 0 days    Minutes of Exercise per Session: 0 min  Stress: Stress Concern Present (11/02/2023)   [employer] of Occupational Health - Occupational Stress Questionnaire    Feeling of Stress: To some extent  Social Connections: Socially Isolated (11/02/2023)   Social Connection and Isolation Panel    Frequency of Communication with Friends and Family: More than three times a week    Frequency of Social Gatherings with Friends and Family: More than three times a week    Attends Religious Services: Never    Database Administrator or Organizations: No    Attends Banker Meetings: Never    Marital Status: Widowed  Intimate Partner Violence: Not At Risk (11/02/2023)   Epic    Fear of Current or Ex-Partner: No    Emotionally Abused: No    Physically Abused: No    Sexually Abused: No  Depression (PHQ2-9): Low Risk (01/01/2024)   Depression (PHQ2-9)    PHQ-2 Score: 0  Alcohol Screen: Low Risk (11/02/2023)   Alcohol Screen    Last Alcohol Screening Score  (AUDIT): 0  Housing: Unknown (11/02/2023)   Epic    Unable to Pay for Housing in the Last Year: No    Number of Times Moved in the Last Year: Not on file    Homeless in the Last Year: No  Utilities: Not At Risk (11/02/2023)   Epic    Threatened with loss of utilities: No  Health Literacy: Adequate Health Literacy (11/02/2023)   B1300 Health Literacy    Frequency of need for help with medical instructions: Never     PHYSICAL EXAM  GENERAL EXAM/CONSTITUTIONAL: Vitals:  Vitals:   05/30/24 1343  BP: 114/83  Pulse: (!) 108  SpO2: 91%  Weight: 128 lb (58.1 kg)  Height: 5' 8 (1.727 m)     Body mass index is 19.46 kg/m. Wt Readings from Last 3 Encounters:  05/30/24 128 lb (58.1 kg)  04/08/24 134 lb (60.8 kg)  02/19/24 136 lb (61.7 kg)   Patient is in no distress; well developed, nourished and groomed; neck is supple. On O2 supplement   MUSCULOSKELETAL: Gait, strength, tone, movements noted in Neurologic exam below  NEUROLOGIC: MENTAL STATUS:      No data to display         awake, alert, oriented to person, place and time recent and remote memory intact normal attention and concentration language fluent, comprehension intact, naming intact fund of knowledge appropriate  CRANIAL NERVE:  2nd, 3rd, 4th, 6th -visual fields full to confrontation, extraocular muscles intact, no nystagmus 5th - facial sensation symmetric 7th - facial strength symmetric 8th - hearing intact 9th - palate elevates symmetrically, uvula midline 11th - shoulder shrug symmetric 12th - tongue protrusion midline  MOTOR:  normal bulk and tone, full strength in the BUE, BLE  SENSORY:  Decrease vibration up to ankle bilaterally   COORDINATION:  finger-nose-finger, fine finger movements normal  GAIT/STATION:  Using a wheelchair today   DIAGNOSTIC DATA (LABS, IMAGING, TESTING) - I reviewed patient records, labs, notes, testing and imaging myself where available.  Lab Results   Component Value Date   WBC 6.4 02/15/2023   HGB 13.7 02/15/2023   HCT 42.3 02/15/2023   MCV 94 02/15/2023   PLT 165 02/15/2023      Component Value Date/Time   NA 143 02/19/2024 1455   K 5.1 02/19/2024 1455   CL 106 02/19/2024 1455   CO2 23 02/19/2024 1455   GLUCOSE 96 02/19/2024 1455   GLUCOSE 154 (H) 03/05/2018 0327   BUN 10 02/19/2024 1455   CREATININE 0.76 02/19/2024 1455   CALCIUM  9.7 02/19/2024 1455   PROT 6.3 02/19/2024 1455   ALBUMIN 4.2 02/19/2024 1455   AST 19 02/19/2024 1455   ALT 16 02/19/2024 1455   ALKPHOS 93 02/19/2024 1455   BILITOT 0.5 02/19/2024 1455   GFRNONAA 69 07/01/2020 1430   GFRAA 79 07/01/2020 1430   Lab Results  Component Value Date   CHOL 142 02/15/2023   HDL 59 02/15/2023   LDLCALC 65 02/15/2023   TRIG 96 02/15/2023   CHOLHDL 2.4 02/15/2023   Lab Results  Component Value Date   HGBA1C 5.8 (H) 02/15/2023   Lab Results  Component Value Date   VITAMINB12 310 04/20/2021   Lab Results  Component Value Date   TSH 6.310 (H) 02/15/2023     ASSESSMENT AND PLAN  74 y.o. year old female with anxiety/panic attack, COPD on home oxygen  who is presenting for follow up for her neuropathy.  She is on duloxetine  90 mg daily, and Pregabalin  25 mg  twice daily.  She tells me that her neuropathy pain is well controlled with the combination of duloxetine  and pregabalin .  Will continue current medication and I will see her in 1 year for follow-up or sooner if worse.    1. Peripheral polyneuropathy     Patient Instructions  Continue current medications including Duloxetine  90 mg daily and Pregabalin  25 mg twice daily  Continue to follow up with your doctors  Return in a year or sooner if worse    No orders of the defined types were placed in this encounter.   Meds ordered this encounter  Medications   DULoxetine  (CYMBALTA ) 30 MG capsule    Sig: Take 1 capsule (30 mg total) by mouth daily.    Dispense:  90 capsule    Refill:  3    DULoxetine  (CYMBALTA ) 60 MG capsule    Sig: Take 1 capsule (60 mg total) by mouth daily.    Dispense:  90 capsule    Refill:  3   pregabalin  (LYRICA ) 25 MG capsule    Sig: Take 1 capsule (25 mg total) by mouth 2 (two) times daily.    Dispense:  60 capsule    Refill:  5    Return in about 1 year (around 05/30/2025).   Pastor Falling, MD 05/30/2024, 2:19 PM  [facility] [address], [address] [place], [place] [address] [phone]  "

## 2024-05-30 NOTE — Telephone Encounter (Signed)
 Pt was at her neurologist's office today when she was asked about her med list that states she takes dextromethorphan  daily. She was unaware she had such a prescription and reports she would like to start on it, given that she has a chronic cough with her COPD. The patient was told she can get this medication over the counter, to which she said I've tried it all, I've tried Delsym  and it doesn't work for me. Pt has a PCP appointment this coming Monday. She is requesting a call back if the medication is able to be sent to her pharmacy on file.   FYI Only or Action Required?: Action required by provider: medication refill request.  Patient was last seen in primary care on 01/16/2024 by Gayle Saddie FALCON, PA-C.    Reason for Triage: pt stated she typically coughs a lot but sxs has gotten worse. Pt states she was prescribed dextrometamorphan in the past but never received the prescription. She is requesting for the medication to treat sxs. Please call and advise.    Reason for Disposition  [1] Prescription prescribed recently is not at pharmacy AND [2] triager has access to patient's EMR AND [3] prescription is recorded in the EMR  Answer Assessment - Initial Assessment Questions 1. DRUG NAME: What medicine do you need to have refilled?     Dextromethorphan   Protocols used: Medication Refill and Renewal Call-A-AH

## 2024-05-30 NOTE — Patient Instructions (Signed)
 Continue current medications including Duloxetine  90 mg daily and Pregabalin  25 mg twice daily  Continue to follow up with your doctors  Return in a year or sooner if worse

## 2024-06-03 ENCOUNTER — Telehealth: Payer: Self-pay

## 2024-06-03 ENCOUNTER — Ambulatory Visit

## 2024-06-03 ENCOUNTER — Other Ambulatory Visit (HOSPITAL_COMMUNITY): Payer: Self-pay

## 2024-06-03 VITALS — BP 129/86 | HR 103 | Temp 97.6°F | Ht 68.0 in | Wt 128.1 lb

## 2024-06-03 DIAGNOSIS — G8929 Other chronic pain: Secondary | ICD-10-CM

## 2024-06-03 DIAGNOSIS — Z1329 Encounter for screening for other suspected endocrine disorder: Secondary | ICD-10-CM

## 2024-06-03 DIAGNOSIS — J449 Chronic obstructive pulmonary disease, unspecified: Secondary | ICD-10-CM | POA: Diagnosis not present

## 2024-06-03 DIAGNOSIS — R8290 Unspecified abnormal findings in urine: Secondary | ICD-10-CM

## 2024-06-03 DIAGNOSIS — F411 Generalized anxiety disorder: Secondary | ICD-10-CM

## 2024-06-03 DIAGNOSIS — M5441 Lumbago with sciatica, right side: Secondary | ICD-10-CM

## 2024-06-03 DIAGNOSIS — Z23 Encounter for immunization: Secondary | ICD-10-CM | POA: Diagnosis not present

## 2024-06-03 DIAGNOSIS — Z131 Encounter for screening for diabetes mellitus: Secondary | ICD-10-CM

## 2024-06-03 DIAGNOSIS — R82998 Other abnormal findings in urine: Secondary | ICD-10-CM | POA: Diagnosis not present

## 2024-06-03 DIAGNOSIS — M5442 Lumbago with sciatica, left side: Secondary | ICD-10-CM

## 2024-06-03 DIAGNOSIS — E785 Hyperlipidemia, unspecified: Secondary | ICD-10-CM

## 2024-06-03 DIAGNOSIS — I5021 Acute systolic (congestive) heart failure: Secondary | ICD-10-CM

## 2024-06-03 DIAGNOSIS — G6181 Chronic inflammatory demyelinating polyneuritis: Secondary | ICD-10-CM

## 2024-06-03 DIAGNOSIS — I5022 Chronic systolic (congestive) heart failure: Secondary | ICD-10-CM

## 2024-06-03 DIAGNOSIS — I5181 Takotsubo syndrome: Secondary | ICD-10-CM

## 2024-06-03 DIAGNOSIS — Z0000 Encounter for general adult medical examination without abnormal findings: Secondary | ICD-10-CM

## 2024-06-03 DIAGNOSIS — R051 Acute cough: Secondary | ICD-10-CM

## 2024-06-03 LAB — POCT URINALYSIS DIP (CLINITEK)
Bilirubin, UA: NEGATIVE
Glucose, UA: NEGATIVE mg/dL
Ketones, POC UA: NEGATIVE mg/dL
Leukocytes, UA: NEGATIVE
Nitrite, UA: NEGATIVE
POC PROTEIN,UA: NEGATIVE
Spec Grav, UA: 1.01
Urobilinogen, UA: 0.2 U/dL
pH, UA: 6

## 2024-06-03 MED ORDER — HYDROCODONE-ACETAMINOPHEN 5-325 MG PO TABS: 1.0000 | ORAL_TABLET | Freq: Two times a day (BID) | ORAL | 0 refills | Status: AC | PRN

## 2024-06-03 MED ORDER — DEXTROMETHORPHAN HBR 15 MG PO CAPS: 30.0000 mg | ORAL_CAPSULE | Freq: Every evening | ORAL | 0 refills | Status: AC | PRN

## 2024-06-03 MED ORDER — DOXYCYCLINE HYCLATE 100 MG PO TABS: 100.0000 mg | ORAL_TABLET | Freq: Two times a day (BID) | ORAL | 0 refills | Status: AC

## 2024-06-03 NOTE — Patient Instructions (Signed)
 VISIT SUMMARY: Today, we addressed your worsening breathing, anxiety, neuropathic pain, and urinary symptoms. We also updated your vaccination status.  YOUR PLAN: COPD WITH ACUTE EXACERBATION: Your breathing has worsened, and you have increased phlegm, suggesting an exacerbation of COPD. -You are prescribed doxycycline  for 7 days. Please take it with food. -Continue your follow-up with pulmonology on February 17th.  CHRONIC INFLAMMATORY DEMYELINATING POLYNEUROPATHY: Your neuropathy symptoms are being managed effectively with duloxetine . -Continue taking duloxetine  for neuropathy management.  CHRONIC LOW BACK PAIN WITH SCIATICA: You have ongoing chronic back pain. -Your pain medication has been refilled.  GENERALIZED ANXIETY DISORDER: You are experiencing increased anxiety and panic attacks. Hydroxyzine  has been ineffective, but alprazolam  has been helpful. -Continue taking alprazolam  0.5 mg twice daily as needed. -Continue taking hydroxyzine  as needed.  URINARY SYMPTOMS: You have intermittent urinary odor and burning, possibly due to dehydration. -A urine sample has been obtained to check for infection.  GENERAL HEALTH MAINTENANCE: Your vaccination status was reviewed and updated. -You received the Prevnar 20 vaccine today.  If you have any problems before your next visit feel free to message me via MyChart (minor issues or questions) or call the office, otherwise you may reach out to schedule an office visit.  Thank you! Saddie Sacks, PA-C

## 2024-06-03 NOTE — Telephone Encounter (Signed)
 Pharmacy Patient Advocate Encounter   Received notification from Onbase CMM KEY that prior authorization for HYDROcodone -acetaminophen  (NORCO/VICODIN) 5-325 MG tablet  is required/requested.   Insurance verification completed.   The patient is insured through [facility].   Per test claim: PA required; PA submitted to above mentioned insurance via Latent Key/confirmation #/EOC [id number] Status is pending

## 2024-06-04 ENCOUNTER — Other Ambulatory Visit (HOSPITAL_COMMUNITY): Payer: Self-pay

## 2024-06-04 LAB — COMPREHENSIVE METABOLIC PANEL WITH GFR
ALT: 17 IU/L (ref 0–32)
AST: 20 IU/L (ref 0–40)
Albumin: 4.5 g/dL (ref 3.8–4.8)
Alkaline Phosphatase: 103 IU/L (ref 49–135)
BUN/Creatinine Ratio: 14 (ref 12–28)
BUN: 11 mg/dL (ref 8–27)
Bilirubin Total: 0.4 mg/dL (ref 0.0–1.2)
CO2: 28 mmol/L (ref 20–29)
Calcium: 10.4 mg/dL — ABNORMAL HIGH (ref 8.7–10.3)
Chloride: 107 mmol/L — ABNORMAL HIGH (ref 96–106)
Creatinine, Ser: 0.81 mg/dL (ref 0.57–1.00)
Globulin, Total: 2 g/dL (ref 1.5–4.5)
Glucose: 111 mg/dL — ABNORMAL HIGH (ref 70–99)
Potassium: 6.2 mmol/L — ABNORMAL HIGH (ref 3.5–5.2)
Sodium: 146 mmol/L — ABNORMAL HIGH (ref 134–144)
Total Protein: 6.5 g/dL (ref 6.0–8.5)
eGFR: 76 mL/min/1.73

## 2024-06-04 LAB — HEMOGLOBIN A1C
Est. average glucose Bld gHb Est-mCnc: 117 mg/dL
Hgb A1c MFr Bld: 5.7 % — ABNORMAL HIGH (ref 4.8–5.6)

## 2024-06-04 LAB — CBC WITH DIFFERENTIAL/PLATELET
Basophils Absolute: 0.1 x10E3/uL (ref 0.0–0.2)
Basos: 1 %
EOS (ABSOLUTE): 0 x10E3/uL (ref 0.0–0.4)
Eos: 0 %
Hematocrit: 46.8 % — ABNORMAL HIGH (ref 34.0–46.6)
Hemoglobin: 14.6 g/dL (ref 11.1–15.9)
Immature Grans (Abs): 0.1 x10E3/uL (ref 0.0–0.1)
Immature Granulocytes: 1 %
Lymphocytes Absolute: 1.2 x10E3/uL (ref 0.7–3.1)
Lymphs: 16 %
MCH: 29.5 pg (ref 26.6–33.0)
MCHC: 31.2 g/dL — ABNORMAL LOW (ref 31.5–35.7)
MCV: 95 fL (ref 79–97)
Monocytes Absolute: 0.4 x10E3/uL (ref 0.1–0.9)
Monocytes: 6 %
Neutrophils Absolute: 5.6 x10E3/uL (ref 1.4–7.0)
Neutrophils: 76 %
Platelets: 225 x10E3/uL (ref 150–450)
RBC: 4.95 x10E6/uL (ref 3.77–5.28)
RDW: 12.2 % (ref 11.7–15.4)
WBC: 7.4 x10E3/uL (ref 3.4–10.8)

## 2024-06-04 LAB — TSH: TSH: 3.38 u[IU]/mL (ref 0.450–4.500)

## 2024-06-04 NOTE — Telephone Encounter (Signed)
 Copied from CRM [id number]. Topic: Clinical - Medication Prior Auth >> Jun 04, 2024 12:19 PM Donna BRAVO wrote: Reason for CRM: Patient calling asking for update on auth for refill.  Read Note Verbatim:   Duwaine Nat LITTIE Bishop    06/03/24  6:17 PM Note Pharmacy Patient Advocate Encounter   Received notification from Onbase CMM KEY that prior authorization for HYDROcodone -acetaminophen  (NORCO/VICODIN) 5-325 MG tablet  is required/requested.   Insurance verification completed.   The patient is insured through [facility].   Per test claim: PA required; PA submitted to above mentioned insurance via Latent Key/confirmation #/EOC [id number] Status is pending  Patient wants prescription sent to:  [id number] - [place], [place] - [address] [place] [address] Phone: [phone] Fax: [phone] Hours: Not open 24 hours

## 2024-06-04 NOTE — Telephone Encounter (Signed)
 Pharmacy Patient Advocate Encounter  Received notification from [facility] MEDICARE that Prior Authorization for { (NORCO/VICODIN) 5-325 MG tablet has been APPROVED from 06/04/2024 to 05/22/2025   PA #/Case ID/Reference #: [id number]

## 2024-06-05 ENCOUNTER — Other Ambulatory Visit: Payer: Self-pay

## 2024-06-05 ENCOUNTER — Ambulatory Visit: Payer: Self-pay

## 2024-06-05 DIAGNOSIS — E875 Hyperkalemia: Secondary | ICD-10-CM

## 2024-06-05 NOTE — Progress Notes (Signed)
kayex 

## 2024-06-06 ENCOUNTER — Other Ambulatory Visit

## 2024-06-06 DIAGNOSIS — E875 Hyperkalemia: Secondary | ICD-10-CM

## 2024-06-07 ENCOUNTER — Ambulatory Visit: Payer: Self-pay

## 2024-06-07 DIAGNOSIS — Z0000 Encounter for general adult medical examination without abnormal findings: Secondary | ICD-10-CM | POA: Insufficient documentation

## 2024-06-07 DIAGNOSIS — I5022 Chronic systolic (congestive) heart failure: Secondary | ICD-10-CM | POA: Insufficient documentation

## 2024-06-07 LAB — BASIC METABOLIC PANEL WITH GFR
BUN/Creatinine Ratio: 20 (ref 12–28)
BUN: 16 mg/dL (ref 8–27)
CO2: 26 mmol/L (ref 20–29)
Calcium: 9.8 mg/dL (ref 8.7–10.3)
Chloride: 103 mmol/L (ref 96–106)
Creatinine, Ser: 0.79 mg/dL (ref 0.57–1.00)
Glucose: 99 mg/dL (ref 70–99)
Potassium: 4.9 mmol/L (ref 3.5–5.2)
Sodium: 143 mmol/L (ref 134–144)
eGFR: 78 mL/min/1.73

## 2024-06-07 NOTE — Assessment & Plan Note (Signed)
 Symptoms stable. Follows with cardiology. Continue Metoprolol  XL 25 mg daily.

## 2024-06-07 NOTE — Assessment & Plan Note (Signed)
 Follows with cardiology. Stable. Cont Metoprolol  25 mg XL daily

## 2024-06-07 NOTE — Assessment & Plan Note (Signed)
 Anxiety/panic attacks stable with 0.5 mg Alprazolam  as needed. This is a decreased dosage from 1 mg in the past. Patient was prescribed BuSpar  5 mg BID for prevention of anxiety attacks, however she reports that she could not tolerate this medication due to nausea. Discussed with patient that our ultimate goal is decrease usage due to risk of benzodiazepines in people >65+. She tried and failed hydroxyzine  without relief of symptoms. Patient asking to go back up to 1 mg dose of alprazolam . Discussed that our goal is to reduce dosage, not increase, due to safety concerns.  - Advised that I will not decrease the dosage of Xanax  but we also cannot increase it or # of tablets.  - Continue Xanax  0.5 mg prn as needed for acute anxiety

## 2024-06-07 NOTE — Assessment & Plan Note (Signed)
 PCV 20 today.  Labs updated.  UTD on all other health maintenance screenings.

## 2024-06-07 NOTE — Assessment & Plan Note (Addendum)
 Managed with Duloxetine  90 mg daily and pregabalin .  Hydrocodone  5-325 mg as needed for breakthrough pain. PDMP reviewed, no aberrancies.

## 2024-06-07 NOTE — Progress Notes (Signed)
 "  Complete physical exam  Patient: Vickie Bowman   DOB: May 01, 1951   74 y.o. Female  MRN: [id number]  Subjective:    Chief Complaint  Patient presents with   Annual Exam    History of Present Illness   Vickie Bowman is a 74 year old female with COPD who presents for annual wellness examination.   Dyspnea and cough - Cough present, particularly at bedtime - Occasional phlegm production described as 'like a ball' - Cough pattern consistent with baseline - No recent illness, recently followed up with pulmonology who gave her dextromethorphan  for cough and that has been effective for her   Anxiety and panic attacks - Daily anxiety and panic attacks - Symptoms alleviated by Xanax  0.5 mg twice daily. Patient is requesting we go back up to the 1 mg tablets because they were more effective for her  - Hydroxyzine  previously prescribed, not found helpful for anxiety   Neuropathic pain - Back and leg pain attributed to neuropathy - Duloxetine  taken for neuropathy, perceived as effective  Urinary symptoms - Urine occasionally has odor and intermittent perceived burning sensation - Urine color varies from clear to dark - Low water intake, suspected to contribute to symptoms        Most recent fall risk assessment:    06/03/2024    2:45 PM  Fall Risk   Falls in the past year? 0  Injury with Fall? 0  Risk for fall due to : No Fall Risks  Follow up Falls evaluation completed     Most recent depression screenings:    06/03/2024    2:46 PM 01/01/2024    2:47 PM  PHQ 2/9 Scores  PHQ - 2 Score 0 0  PHQ- 9 Score 0     Vision:Not within last year  and Dental: No current dental problems and Receives regular dental care    Patient Care Team: Gayle Saddie JULIANNA DEVONNA as PCP - General (Physician Assistant) Mona Vinie BROCKS, MD as PCP - Cardiology (Cardiology)   Show/hide medication list[1]  ROS  Per HPI      Objective:     BP 129/86   Pulse (!) 103   Temp 97.6 F  (36.4 C) (Oral)   Ht 5' 8 (1.727 m)   Wt 128 lb 1 oz (58.1 kg)   SpO2 95%   BMI 19.47 kg/m    Physical Exam Constitutional:      Appearance: Normal appearance.     Comments: On 2 L O2  Eyes:     Pupils: Pupils are equal, round, and reactive to light.  Cardiovascular:     Rate and Rhythm: Normal rate and regular rhythm.  Pulmonary:     Effort: Pulmonary effort is normal.     Breath sounds: Wheezing (mild, diffuse) present.  Abdominal:     General: Abdomen is flat. Bowel sounds are normal.     Palpations: Abdomen is soft.  Musculoskeletal:        General: Normal range of motion.  Skin:    General: Skin is warm and dry.  Neurological:     General: No focal deficit present.     Mental Status: She is alert.  Psychiatric:        Mood and Affect: Mood normal.        Behavior: Behavior normal.       Results for orders placed or performed in visit on 06/03/24  Comp Met (CMET)  Result Value Ref Range  Glucose 111 (H) 70 - 99 mg/dL   BUN 11 8 - 27 mg/dL   Creatinine, Ser 9.18 0.57 - 1.00 mg/dL   eGFR 76 >40 fO/fpw/8.26   BUN/Creatinine Ratio 14 12 - 28   Sodium 146 (H) 134 - 144 mmol/L   Potassium 6.2 (H) 3.5 - 5.2 mmol/L   Chloride 107 (H) 96 - 106 mmol/L   CO2 28 20 - 29 mmol/L   Calcium  10.4 (H) 8.7 - 10.3 mg/dL   Total Protein 6.5 6.0 - 8.5 g/dL   Albumin 4.5 3.8 - 4.8 g/dL   Globulin, Total 2.0 1.5 - 4.5 g/dL   Bilirubin Total 0.4 0.0 - 1.2 mg/dL   Alkaline Phosphatase 103 49 - 135 IU/L   AST 20 0 - 40 IU/L   ALT 17 0 - 32 IU/L  CBC w/Diff  Result Value Ref Range   WBC 7.4 3.4 - 10.8 x10E3/uL   RBC 4.95 3.77 - 5.28 x10E6/uL   Hemoglobin 14.6 11.1 - 15.9 g/dL   Hematocrit 53.1 (H) 65.9 - 46.6 %   MCV 95 79 - 97 fL   MCH 29.5 26.6 - 33.0 pg   MCHC 31.2 (L) 31.5 - 35.7 g/dL   RDW 87.7 88.2 - 84.5 %   Platelets 225 150 - 450 x10E3/uL   Neutrophils 76 Not Estab. %   Lymphs 16 Not Estab. %   Monocytes 6 Not Estab. %   Eos 0 Not Estab. %   Basos 1 Not  Estab. %   Neutrophils Absolute 5.6 1.4 - 7.0 x10E3/uL   Lymphocytes Absolute 1.2 0.7 - 3.1 x10E3/uL   Monocytes Absolute 0.4 0.1 - 0.9 x10E3/uL   EOS (ABSOLUTE) 0.0 0.0 - 0.4 x10E3/uL   Basophils Absolute 0.1 0.0 - 0.2 x10E3/uL   Immature Granulocytes 1 Not Estab. %   Immature Grans (Abs) 0.1 0.0 - 0.1 x10E3/uL  TSH  Result Value Ref Range   TSH 3.380 0.450 - 4.500 uIU/mL  HgB A1c  Result Value Ref Range   Hgb A1c MFr Bld 5.7 (H) 4.8 - 5.6 %   Est. average glucose Bld gHb Est-mCnc 117 mg/dL  POCT URINALYSIS DIP (CLINITEK)  Result Value Ref Range   Color, UA yellow yellow   Clarity, UA clear clear   Glucose, UA negative negative mg/dL   Bilirubin, UA negative negative   Ketones, POC UA negative negative mg/dL   Spec Grav, UA 8.989 8.989 - 1.025   Blood, UA trace-intact (A) negative   pH, UA 6.0 5.0 - 8.0   POC PROTEIN,UA negative negative, trace   Urobilinogen, UA 0.2 0.2 or 1.0 E.U./dL   Nitrite, UA Negative Negative   Leukocytes, UA Negative Negative       Assessment & Plan:    Routine Health Maintenance and Physical Exam  Health Maintenance  Topic Date Due   COVID-19 Vaccine (4 - 2025-26 season) 01/22/2024   Screening for Lung Cancer  10/01/2024   Medicare Annual Wellness Visit  11/01/2024   Cologuard (Stool DNA test)  11/05/2024   DTaP/Tdap/Td vaccine (2 - Td or Tdap) 11/25/2031   Pneumococcal Vaccine for age over 17  Completed   Flu Shot  Completed   Osteoporosis screening with Bone Density Scan  Completed   Hepatitis C Screening  Completed   Zoster (Shingles) Vaccine  Completed   Meningitis B Vaccine  Aged Out    Discussed health benefits of physical activity, and encouraged her to engage in regular exercise appropriate for her age  and condition.  Screening for diabetes mellitus  Acute cough -     Dextromethorphan  HBr; Take 2 capsules (30 mg total) by mouth at bedtime as needed (cough).  Dispense: 28 capsule; Refill: 0  Chronic inflammatory  demyelinating polyneuropathy (HCC) Assessment & Plan: Managed with Duloxetine  90 mg daily and pregabalin .  Hydrocodone  5-325 mg as needed for breakthrough pain. PDMP reviewed, no aberrancies.   Orders: -     HYDROcodone -Acetaminophen ; Take 1 tablet by mouth 2 (two) times daily as needed for severe pain (pain score 7-10).  Dispense: 30 tablet; Refill: 0  Chronic midline low back pain with bilateral sciatica Assessment & Plan: Chronic midline low back pain with bilateral sciatica managed with Norco. Effective without causing drowsiness or dizziness. - Continue Norco 5-325 mg as needed for pain management.  Orders: -     HYDROcodone -Acetaminophen ; Take 1 tablet by mouth 2 (two) times daily as needed for severe pain (pain score 7-10).  Dispense: 30 tablet; Refill: 0  Screening for thyroid  disorder -     Hemoglobin A1c; Future -     TSH; Future -     CBC with Differential/Platelet; Future  Hyperlipidemia LDL goal <70 Assessment & Plan: Follows with cardiology. LDL at goal. Continue rosuvastatin .  Orders: -     Comprehensive metabolic panel with GFR; Future  Foul smelling urine -     POCT URINALYSIS DIP (CLINITEK)  Encounter for vaccination -     Pneumococcal conjugate vaccine 20-valent  Chronic systolic heart failure (HCC) Assessment & Plan: Symptoms stable. Follows with cardiology. Continue Metoprolol  XL 25 mg daily.   Chronic obstructive pulmonary disease, unspecified COPD type (HCC) Assessment & Plan: Continue Breztri  2 puffs twice daily and Ensifentrine  as prescribed. Continue with supplemental O2 of 2L as needed. Increased phlegm and cough x 2 weeks, likely due to COPD exacerbation.  - Prescribed doxycycline  for 7 days with food. - Continue follow-up with pulmonology on February 17th.   Dark urine Assessment & Plan: UA negative for signs of infection today.  - Intermittent dark urine likely due to dehydration. No UTI signs. - Encouraged increased fluid intake to 60-80  ounces daily, prefer clear water.   Generalized anxiety disorder Assessment & Plan: Anxiety/panic attacks stable with 0.5 mg Alprazolam  as needed. This is a decreased dosage from 1 mg in the past. Patient was prescribed BuSpar  5 mg BID for prevention of anxiety attacks, however she reports that she could not tolerate this medication due to nausea. Discussed with patient that our ultimate goal is decrease usage due to risk of benzodiazepines in people >65+. She tried and failed hydroxyzine  without relief of symptoms. Patient asking to go back up to 1 mg dose of alprazolam . Discussed that our goal is to reduce dosage, not increase, due to safety concerns.  - Advised that I will not decrease the dosage of Xanax  but we also cannot increase it or # of tablets.  - Continue Xanax  0.5 mg prn as needed for acute anxiety    Healthcare maintenance Assessment & Plan: PCV 20 today.  Labs updated.  UTD on all other health maintenance screenings.   Stress-induced cardiomyopathy Assessment & Plan: Follows with cardiology. Stable. Cont Metoprolol  25 mg XL daily   Acute HFrEF (heart failure with reduced ejection fraction) (HCC) Assessment & Plan: Follows with cardiology. Stable. Cont Metoprolol  25 mg XL daily   Other orders -     Doxycycline  Hyclate; Take 1 tablet (100 mg total) by mouth 2 (two) times daily for 7  days.  Dispense: 14 tablet; Refill: 0    Return in about 6 months (around 12/01/2024) for Med check.     Saddie JULIANNA Sacks, PA-C     [1]  Outpatient Medications Prior to Visit  Medication Sig   albuterol  (PROVENTIL ) (2.5 MG/3ML) 0.083% nebulizer solution USE 1 VIAL IN NEBULIZER EVERY 6 HOURS AS NEEDED FOR WHEEZING FOR SHORTNESS OF BREATH   albuterol  (VENTOLIN  HFA) 108 (90 Base) MCG/ACT inhaler Inhale 2 puffs into the lungs every 6 (six) hours as needed for wheezing or shortness of breath.   ALPRAZolam  (XANAX ) 0.5 MG tablet Take 1 tablet by mouth twice daily as needed for anxiety    aspirin  EC 81 MG EC tablet Take 1 tablet (81 mg total) by mouth daily.   BREZTRI  AEROSPHERE 160-9-4.8 MCG/ACT AERO inhaler INHALE 2 PUFFS IN THE MORNING AND AT BEDTIME   Cholecalciferol (VITAMIN D ) 2000 units CAPS Take 1 capsule (2,000 Units total) by mouth daily.   diclofenac  Sodium (VOLTAREN ) 1 % GEL Apply 2 g topically 4 (four) times daily. To affected joint.   DULoxetine  (CYMBALTA ) 30 MG capsule Take 1 capsule (30 mg total) by mouth daily.   DULoxetine  (CYMBALTA ) 60 MG capsule Take 1 capsule (60 mg total) by mouth daily.   Ensifentrine  (OHTUVAYRE ) 3 MG/2.5ML SUSP Inhale 1 Inhalation into the lungs in the morning and at bedtime.   hydrOXYzine  (VISTARIL ) 25 MG capsule Take 1 capsule (25 mg total) by mouth every 8 (eight) hours as needed.   ipratropium-albuterol  (DUONEB) 0.5-2.5 (3) MG/3ML SOLN Take 3 mLs by nebulization every 6 (six) hours as needed.   ketoconazole  (NIZORAL ) 2 % cream Apply 1 Application topically daily.   meloxicam  (MOBIC ) 7.5 MG tablet Take 1 tablet by mouth once daily   metoprolol  succinate (TOPROL -XL) 25 MG 24 hr tablet Take 0.5 tablets (12.5 mg total) by mouth daily.   pregabalin  (LYRICA ) 25 MG capsule Take 1 capsule (25 mg total) by mouth 2 (two) times daily.   rosuvastatin  (CRESTOR ) 20 MG tablet Take 1 tablet by mouth once daily   Spacer/Aero-Holding Chambers (AEROCHAMBER MV) inhaler Use as instructed   [DISCONTINUED] Dextromethorphan  HBr 15 MG CAPS Take 2 capsules (30 mg total) by mouth 3 (three) times daily as needed (cough).   [DISCONTINUED] HYDROcodone -acetaminophen  (NORCO/VICODIN) 5-325 MG tablet Take 1 tablet by mouth 2 (two) times daily as needed for severe pain (pain score 7-10).   No facility-administered medications prior to visit.   "

## 2024-06-07 NOTE — Assessment & Plan Note (Signed)
 UA negative for signs of infection today.  - Intermittent dark urine likely due to dehydration. No UTI signs. - Encouraged increased fluid intake to 60-80 ounces daily, prefer clear water.

## 2024-06-07 NOTE — Assessment & Plan Note (Signed)
 Chronic midline low back pain with bilateral sciatica managed with Norco. Effective without causing drowsiness or dizziness. - Continue Norco 5-325 mg as needed for pain management.

## 2024-06-07 NOTE — Assessment & Plan Note (Addendum)
 Continue Breztri  2 puffs twice daily and Ensifentrine  as prescribed. Continue with supplemental O2 of 2L as needed. Increased phlegm and cough x 2 weeks, likely due to COPD exacerbation.  - Prescribed doxycycline  for 7 days with food. - Continue follow-up with pulmonology on February 17th.

## 2024-06-07 NOTE — Assessment & Plan Note (Signed)
 Follows with cardiology. LDL at goal. Continue rosuvastatin .

## 2024-06-10 NOTE — Telephone Encounter (Signed)
 Called and spoke with patient; related lab results.

## 2024-06-11 ENCOUNTER — Other Ambulatory Visit: Payer: Self-pay

## 2024-06-11 DIAGNOSIS — J449 Chronic obstructive pulmonary disease, unspecified: Secondary | ICD-10-CM

## 2024-06-13 ENCOUNTER — Other Ambulatory Visit: Payer: Self-pay

## 2024-06-13 DIAGNOSIS — G8929 Other chronic pain: Secondary | ICD-10-CM

## 2024-06-26 ENCOUNTER — Other Ambulatory Visit: Payer: Self-pay | Admitting: Family Medicine

## 2024-06-26 DIAGNOSIS — F411 Generalized anxiety disorder: Secondary | ICD-10-CM

## 2024-06-26 DIAGNOSIS — F410 Panic disorder [episodic paroxysmal anxiety] without agoraphobia: Secondary | ICD-10-CM

## 2024-06-27 ENCOUNTER — Ambulatory Visit: Payer: Self-pay

## 2024-06-27 ENCOUNTER — Other Ambulatory Visit: Payer: Self-pay

## 2024-06-27 DIAGNOSIS — F410 Panic disorder [episodic paroxysmal anxiety] without agoraphobia: Secondary | ICD-10-CM

## 2024-06-27 MED ORDER — ALPRAZOLAM 0.5 MG PO TABS: 0.5000 mg | ORAL_TABLET | Freq: Two times a day (BID) | ORAL | 0 refills | Status: AC | PRN

## 2024-06-27 NOTE — Telephone Encounter (Signed)
 1st attempt to contact patient. No answer, no voicemail. Placed in call back for additional attempts to contact.    Copied from CRM [id number]. Topic: Clinical - Medical Advice >> Jun 27, 2024 10:32 AM Victoria B wrote: Reason for CRM: patient states the Aprozalam 0.5 doesn't work like the 1 mg and sometimes she takes 1/1/.

## 2024-07-09 ENCOUNTER — Encounter

## 2024-07-09 ENCOUNTER — Ambulatory Visit: Admitting: Adult Health

## 2024-07-30 ENCOUNTER — Ambulatory Visit: Admitting: Podiatry

## 2024-08-15 ENCOUNTER — Ambulatory Visit: Admitting: Adult Health

## 2024-08-15 ENCOUNTER — Encounter

## 2024-12-02 ENCOUNTER — Ambulatory Visit

## 2024-12-12 ENCOUNTER — Ambulatory Visit

## 2025-06-02 ENCOUNTER — Ambulatory Visit: Admitting: Neurology
